# Patient Record
Sex: Male | Born: 1935 | Race: Black or African American | Hispanic: No | Marital: Married | Smoking: Never smoker
Health system: Southern US, Community
[De-identification: ages and names within clinical notes are randomized; demographics above are authoritative.]

## PROBLEM LIST (undated history)

## (undated) DIAGNOSIS — K635 Polyp of colon: Secondary | ICD-10-CM

## (undated) DIAGNOSIS — J302 Other seasonal allergic rhinitis: Secondary | ICD-10-CM

## (undated) DIAGNOSIS — G4730 Sleep apnea, unspecified: Secondary | ICD-10-CM

## (undated) DIAGNOSIS — H269 Unspecified cataract: Secondary | ICD-10-CM

## (undated) DIAGNOSIS — K047 Periapical abscess without sinus: Secondary | ICD-10-CM

## (undated) DIAGNOSIS — I10 Essential (primary) hypertension: Secondary | ICD-10-CM

## (undated) DIAGNOSIS — K219 Gastro-esophageal reflux disease without esophagitis: Secondary | ICD-10-CM

## (undated) DIAGNOSIS — R011 Cardiac murmur, unspecified: Secondary | ICD-10-CM

## (undated) DIAGNOSIS — E785 Hyperlipidemia, unspecified: Secondary | ICD-10-CM

## (undated) DIAGNOSIS — C61 Malignant neoplasm of prostate: Secondary | ICD-10-CM

## (undated) DIAGNOSIS — H35039 Hypertensive retinopathy, unspecified eye: Secondary | ICD-10-CM

## (undated) DIAGNOSIS — H3530 Unspecified macular degeneration: Secondary | ICD-10-CM

## (undated) DIAGNOSIS — M1990 Unspecified osteoarthritis, unspecified site: Secondary | ICD-10-CM

## (undated) HISTORY — PX: TRANSURETHRAL RESECTION OF PROSTATE: SHX73

## (undated) HISTORY — DX: Malignant neoplasm of prostate: C61

## (undated) HISTORY — DX: Other seasonal allergic rhinitis: J30.2

## (undated) HISTORY — DX: Hyperlipidemia, unspecified: E78.5

## (undated) HISTORY — DX: Unspecified macular degeneration: H35.30

## (undated) HISTORY — DX: Hypertensive retinopathy, unspecified eye: H35.039

## (undated) HISTORY — DX: Periapical abscess without sinus: K04.7

## (undated) HISTORY — DX: Essential (primary) hypertension: I10

## (undated) HISTORY — DX: Unspecified cataract: H26.9

## (undated) HISTORY — DX: Gastro-esophageal reflux disease without esophagitis: K21.9

## (undated) HISTORY — DX: Unspecified osteoarthritis, unspecified site: M19.90

## (undated) HISTORY — DX: Polyp of colon: K63.5

---

## 1990-01-23 HISTORY — PX: INGUINAL HERNIA REPAIR: SUR1180

## 2004-08-30 ENCOUNTER — Inpatient Hospital Stay (HOSPITAL_COMMUNITY): Admission: RE | Admit: 2004-08-30 | Discharge: 2004-09-01 | Payer: Self-pay | Admitting: Urology

## 2004-08-30 ENCOUNTER — Encounter (INDEPENDENT_AMBULATORY_CARE_PROVIDER_SITE_OTHER): Payer: Self-pay | Admitting: Specialist

## 2009-01-23 DIAGNOSIS — C61 Malignant neoplasm of prostate: Secondary | ICD-10-CM

## 2009-01-23 HISTORY — DX: Malignant neoplasm of prostate: C61

## 2009-10-04 ENCOUNTER — Ambulatory Visit (HOSPITAL_COMMUNITY): Admission: RE | Admit: 2009-10-04 | Discharge: 2009-10-04 | Payer: Self-pay | Admitting: Urology

## 2009-11-19 ENCOUNTER — Ambulatory Visit
Admission: RE | Admit: 2009-11-19 | Discharge: 2010-01-10 | Payer: Self-pay | Source: Home / Self Care | Attending: Radiation Oncology | Admitting: Radiation Oncology

## 2010-01-06 ENCOUNTER — Ambulatory Visit (HOSPITAL_BASED_OUTPATIENT_CLINIC_OR_DEPARTMENT_OTHER)
Admission: RE | Admit: 2010-01-06 | Discharge: 2010-01-06 | Payer: Self-pay | Source: Home / Self Care | Attending: Urology | Admitting: Urology

## 2010-01-26 ENCOUNTER — Ambulatory Visit
Admission: RE | Admit: 2010-01-26 | Discharge: 2010-02-11 | Payer: Self-pay | Source: Home / Self Care | Attending: Radiation Oncology | Admitting: Radiation Oncology

## 2010-02-23 ENCOUNTER — Ambulatory Visit: Payer: Medicare Other | Admitting: Radiation Oncology

## 2010-04-04 LAB — COMPREHENSIVE METABOLIC PANEL
Alkaline Phosphatase: 55 U/L (ref 39–117)
BUN: 11 mg/dL (ref 6–23)
CO2: 28 mEq/L (ref 19–32)
Chloride: 102 mEq/L (ref 96–112)
GFR calc non Af Amer: >60 mL/min (ref >60)
Glucose, Bld: 130 mg/dL — ABNORMAL HIGH (ref 70–99)
Potassium: 3.8 mEq/L (ref 3.5–5.1)
Total Bilirubin: 1.7 mg/dL — ABNORMAL HIGH (ref 0.3–1.2)

## 2010-04-04 LAB — CBC
HCT: 43.4 % (ref 39.0–52.0)
MCV: 88 fL (ref 78.0–100.0)
RBC: 4.93 MIL/uL (ref 4.22–5.81)
WBC: 6.8 10*3/uL (ref 4.0–10.5)

## 2010-04-04 LAB — PROTIME-INR
INR: 0.93 (ref 0.00–1.49)
Prothrombin Time: 12.7 seconds (ref 11.6–15.2)

## 2010-06-10 NOTE — Discharge Summary (Signed)
NAME:  Robert Mcknight, Robert Mcknight               ACCOUNT NO.:  [id number]   MEDICAL RECORD NO.:  [id number]          PATIENT TYPE:  INP   LOCATION:  1429                         FACILITY:  [facility]   PHYSICIAN:  Excell Seltzer. Annabell Howells, M.D.    DATE OF BIRTH:  08/25/1933   DATE OF ADMISSION:  08/30/2004  DATE OF DISCHARGE:  09/01/2004                                 DISCHARGE SUMMARY   HISTORY OF PRESENT ILLNESS:  Mr. Mcelroy is a 77 year old black male with  BPH, bladder outlet obstruction, urinary retention with failed medical  therapies, to undergo TURP.  He has a history of elevated PSA with TIN on  biopsy.   PAST MEDICAL HISTORY:  1.  Hypertension.  2.  Pulmonary issues.  3.  Hypercholesterolemia.  4.  Reflux.   PAST SURGICAL HISTORY:  1.  Hernia repair.  2.  Colon polypectomies.   ADMISSION MEDICATIONS:  Norvasc, hydrochlorothiazide, Crestor, ranitidine,  vitamin E, Flomax, fish oil, potassium gluconate.   HISTORY AND PHYSICAL:  For additional details of the History and Physical,  please see the attached office H&P.   HOSPITAL COURSE:  On the day of admission, the patient was taken to the  operating room where he underwent a TURP.  A three-way Foley catheter was  left indwelling on CBI postoperatively.  His postoperative course was  unremarkable.  On the first postoperative day, the urine was only light pink  on irrigation.  Irrigation was discontinued.  On 08/10, the second  postoperative day, he was doing well, his Foley was removed, he was able to  void without difficulty.  His T-max was 100.7 but he was afebrile.  He was  discharged with a final diagnosis of BPH with urinary retention.  His  pathology was benign.  There were no complications during his admission.  His discharge medications include Cipro and resumption of his home  medications.  He was instructed to follow up in 2-3 weeks.   DISPOSITION:  To home.   PROGNOSIS:  Good.   CONDITION:  Improved.      Excell Seltzer. Annabell Howells, M.D.  Electronically Signed     JJW/MEDQ  D:  09/22/2004  T:  09/22/2004  Job:  [id number]

## 2010-06-10 NOTE — Op Note (Signed)
NAME:  Robert Mcknight, Robert Mcknight               ACCOUNT NO.:  [id number]   MEDICAL RECORD NO.:  [id number]          PATIENT TYPE:  AMB   LOCATION:  DAY                          FACILITY:  [facility]   PHYSICIAN:  Excell Seltzer. Annabell Howells, M.D.    DATE OF BIRTH:  24-Jun-1933   DATE OF PROCEDURE:  08/30/2004  DATE OF DISCHARGE:                                 OPERATIVE REPORT   PROCEDURE:  Transurethral resection of the prostate.   PREOPERATIVE DIAGNOSIS:  Benign prostatic hypertrophy with urinary  retention.   POSTOPERATIVE DIAGNOSIS:  Benign prostatic hypertrophy with urinary  retention.   SURGEON:  Dr. Bjorn Pippin.   ANESTHESIA:  General.   SPECIMEN:  Prostate chips.   DRAIN:  22-French three-way Foley catheter.   COMPLICATIONS:  None.   INDICATIONS:  [name] is a 77 year old black male with urinary retention.  He has had a prior history of elevated PSA.  He has 3 negative biopsies, the  last in October 2005.  There was some atypia and PIN but no cancer.  He has  failed to improve with Flomax, and we have elected to proceed with TURP   FINDINGS AT PROCEDURE:  The patient was given p.o. Cipro, was taken to the  operating room where a general anesthetic was induced.  His Foley catheter  was removed.  He was placed in lithotomy position.  His perineum and  genitalia were prepped with Betadine solution.  He was draped in the usual  sterile fashion.  The 28-French resectoscope sheath was inserted and fitted  with an Iglesias handle, 12-degree lens, and 26 loop.  Resection of the  prostate was performed on a blended setting.  Initially the middle lobe,  which was quite prominent, was resected followed by the floor of the  prostate to alongside the verumontanum.  The right lobe of prostate was then  resected from bladder neck to apex out to the capsular fibers.  This was  followed by the left lobe.  Some anterior apical tissue was then resected.  The bladder was evacuated free of chips.  Hemostasis was  achieved.  Final  inspection revealed no active bleeding, no retained chips, and intact  ureteral orifices and an external sphincter.  Upon removal of the scope,  pressure on the bladder produced an extensive excellent an stream.  A 22-  [place] three-way Foley catheter was inserted without difficulty.  Balloon  was filled with 30 mL of sterile fluid.  The catheter was held on traction  and irrigated.  The irrigation remained clear. It was then placed to  continuous irrigation and straight drainage.  The patient was taken down  from lithotomy position.  His anesthetic was reversed.  He was moved to the  recovery room in stable condition.  There were no complications.      Excell Seltzer. Annabell Howells, M.D.  Electronically Signed     JJW/MEDQ  D:  08/30/2004  T:  08/30/2004  Job:  [id number]   cc:   Dr. Lucia Bitter, [place]

## 2010-10-24 ENCOUNTER — Encounter: Payer: Self-pay | Admitting: Internal Medicine

## 2010-11-04 ENCOUNTER — Ambulatory Visit (INDEPENDENT_AMBULATORY_CARE_PROVIDER_SITE_OTHER): Payer: Medicare Other | Admitting: Urology

## 2010-11-04 DIAGNOSIS — Z8546 Personal history of malignant neoplasm of prostate: Secondary | ICD-10-CM

## 2010-11-04 DIAGNOSIS — N401 Enlarged prostate with lower urinary tract symptoms: Secondary | ICD-10-CM

## 2010-11-04 DIAGNOSIS — R310 Gross hematuria: Secondary | ICD-10-CM

## 2010-11-04 DIAGNOSIS — N138 Other obstructive and reflux uropathy: Secondary | ICD-10-CM

## 2010-11-07 ENCOUNTER — Ambulatory Visit (AMBULATORY_SURGERY_CENTER): Payer: Medicare Other | Admitting: *Deleted

## 2010-11-07 ENCOUNTER — Telehealth: Payer: Self-pay | Admitting: *Deleted

## 2010-11-07 VITALS — Ht 69.0 in | Wt 209.6 lb

## 2010-11-07 DIAGNOSIS — Z1211 Encounter for screening for malignant neoplasm of colon: Secondary | ICD-10-CM

## 2010-11-07 MED ORDER — PEG-KCL-NACL-NASULF-NA ASC-C 100 G PO SOLR: ORAL | Status: DC

## 2010-11-07 NOTE — Telephone Encounter (Signed)
Pt here for PV for colonoscopy with Dr. Marina Goodell scheduled for 11/21/2010.  Pt has had 2 colonoscopy procedures in [place] with Dr. Aleene Davidson.  Pt thinks last colonoscopy was 6 years ago.  He also said that all of prior colon records from [place] were sent to his PCP; Dr. Jarome Matin.  Release of Information form for Dr. Jarome Matin signed and given to Milford Cage, CMA. Ezra Sites

## 2010-11-07 NOTE — Progress Notes (Signed)
Pt here for PV for colonoscopy with Dr. Perry scheduled for 11/21/2010.  Pt has had 2 colonoscopy procedures in [place] with Dr. Spainhour.  Pt thinks last colonoscopy was 6 years ago.  He also said that all of prior colon records from [place] were sent to his PCP; Dr. Daniel Paterson.  Release of Information form for Dr. Daniel Paterson signed and given to Barbara Smith, CMA. Kathy Smith   

## 2010-11-08 NOTE — Telephone Encounter (Signed)
Record release faxed to Dr. Eloise Harman.

## 2010-11-21 ENCOUNTER — Ambulatory Visit (AMBULATORY_SURGERY_CENTER): Payer: Medicare Other | Admitting: Internal Medicine

## 2010-11-21 ENCOUNTER — Encounter: Payer: Self-pay | Admitting: Internal Medicine

## 2010-11-21 VITALS — BP 158/89 | HR 72 | Temp 97.9°F | Resp 16 | Ht 69.0 in | Wt 209.0 lb

## 2010-11-21 DIAGNOSIS — D126 Benign neoplasm of colon, unspecified: Secondary | ICD-10-CM

## 2010-11-21 DIAGNOSIS — Z86010 Personal history of colonic polyps: Secondary | ICD-10-CM

## 2010-11-21 DIAGNOSIS — Z1211 Encounter for screening for malignant neoplasm of colon: Secondary | ICD-10-CM

## 2010-11-21 MED ORDER — SODIUM CHLORIDE 0.9 % IV SOLN: 500.0000 mL | INTRAVENOUS | Status: DC

## 2010-11-21 NOTE — Progress Notes (Signed)
Pt's abdomen distended.  Up to BR to try to pass air with wife at side

## 2010-11-21 NOTE — Patient Instructions (Signed)
Please review discharge instructions (blue and green sheets)  Await pathology  Resume normal medications  Your blood sugar in the recovery room was 115

## 2010-11-22 ENCOUNTER — Telehealth: Payer: Self-pay | Admitting: *Deleted

## 2010-11-22 NOTE — Telephone Encounter (Signed)

## 2011-01-10 NOTE — Telephone Encounter (Signed)
Not sure if these records came in so I am forwarding this to you.

## 2011-01-11 ENCOUNTER — Encounter: Payer: Self-pay | Admitting: *Deleted

## 2011-01-11 NOTE — Progress Notes (Signed)
On 11/22/09 I-PSS results= [id number] score=4 11/22/09 IIEF results= [id number]

## 2011-01-11 NOTE — Progress Notes (Unsigned)
On I-PSS resuults= 0000001 score=1

## 2011-05-05 ENCOUNTER — Ambulatory Visit (INDEPENDENT_AMBULATORY_CARE_PROVIDER_SITE_OTHER): Payer: Medicare Other | Admitting: Urology

## 2011-05-05 DIAGNOSIS — Z8546 Personal history of malignant neoplasm of prostate: Secondary | ICD-10-CM

## 2011-05-05 DIAGNOSIS — R82998 Other abnormal findings in urine: Secondary | ICD-10-CM

## 2011-05-05 DIAGNOSIS — R310 Gross hematuria: Secondary | ICD-10-CM

## 2011-05-26 ENCOUNTER — Ambulatory Visit (INDEPENDENT_AMBULATORY_CARE_PROVIDER_SITE_OTHER): Payer: Medicare Other | Admitting: Urology

## 2011-05-26 DIAGNOSIS — N401 Enlarged prostate with lower urinary tract symptoms: Secondary | ICD-10-CM

## 2011-05-26 DIAGNOSIS — N138 Other obstructive and reflux uropathy: Secondary | ICD-10-CM

## 2011-05-26 DIAGNOSIS — Z8546 Personal history of malignant neoplasm of prostate: Secondary | ICD-10-CM

## 2011-05-26 DIAGNOSIS — R310 Gross hematuria: Secondary | ICD-10-CM

## 2012-07-05 ENCOUNTER — Ambulatory Visit (INDEPENDENT_AMBULATORY_CARE_PROVIDER_SITE_OTHER): Payer: Medicare Other | Admitting: Urology

## 2012-07-05 DIAGNOSIS — R3129 Other microscopic hematuria: Secondary | ICD-10-CM

## 2012-07-05 DIAGNOSIS — N3040 Irradiation cystitis without hematuria: Secondary | ICD-10-CM

## 2012-07-05 DIAGNOSIS — Z8546 Personal history of malignant neoplasm of prostate: Secondary | ICD-10-CM

## 2013-01-03 ENCOUNTER — Encounter (INDEPENDENT_AMBULATORY_CARE_PROVIDER_SITE_OTHER): Payer: Self-pay

## 2013-01-03 ENCOUNTER — Ambulatory Visit (INDEPENDENT_AMBULATORY_CARE_PROVIDER_SITE_OTHER): Payer: Medicare Other | Admitting: Urology

## 2013-01-03 DIAGNOSIS — N3941 Urge incontinence: Secondary | ICD-10-CM

## 2013-01-03 DIAGNOSIS — N401 Enlarged prostate with lower urinary tract symptoms: Secondary | ICD-10-CM

## 2013-01-03 DIAGNOSIS — R350 Frequency of micturition: Secondary | ICD-10-CM

## 2013-01-03 DIAGNOSIS — N138 Other obstructive and reflux uropathy: Secondary | ICD-10-CM

## 2013-01-03 DIAGNOSIS — Z8546 Personal history of malignant neoplasm of prostate: Secondary | ICD-10-CM

## 2013-01-03 DIAGNOSIS — N529 Male erectile dysfunction, unspecified: Secondary | ICD-10-CM

## 2013-03-14 ENCOUNTER — Ambulatory Visit (INDEPENDENT_AMBULATORY_CARE_PROVIDER_SITE_OTHER): Payer: MEDICARE | Admitting: Urology

## 2013-03-14 DIAGNOSIS — Z8546 Personal history of malignant neoplasm of prostate: Secondary | ICD-10-CM

## 2013-03-14 DIAGNOSIS — N3941 Urge incontinence: Secondary | ICD-10-CM

## 2013-03-14 DIAGNOSIS — R350 Frequency of micturition: Secondary | ICD-10-CM

## 2013-03-20 ENCOUNTER — Inpatient Hospital Stay (HOSPITAL_COMMUNITY)
Admission: EM | Admit: 2013-03-20 | Discharge: 2013-03-24 | DRG: 379 | Disposition: A | Discharge status: Home / Self Care | Payer: MEDICARE | Attending: Internal Medicine | Admitting: Internal Medicine

## 2013-03-20 ENCOUNTER — Encounter (HOSPITAL_COMMUNITY): Payer: Self-pay | Admitting: Emergency Medicine

## 2013-03-20 DIAGNOSIS — R001 Bradycardia, unspecified: Secondary | ICD-10-CM | POA: Diagnosis not present

## 2013-03-20 DIAGNOSIS — K5731 Diverticulosis of large intestine without perforation or abscess with bleeding: Principal | ICD-10-CM | POA: Diagnosis present

## 2013-03-20 DIAGNOSIS — I498 Other specified cardiac arrhythmias: Secondary | ICD-10-CM | POA: Diagnosis present

## 2013-03-20 DIAGNOSIS — K219 Gastro-esophageal reflux disease without esophagitis: Secondary | ICD-10-CM | POA: Diagnosis present

## 2013-03-20 DIAGNOSIS — E119 Type 2 diabetes mellitus without complications: Secondary | ICD-10-CM | POA: Diagnosis present

## 2013-03-20 DIAGNOSIS — K6289 Other specified diseases of anus and rectum: Secondary | ICD-10-CM | POA: Diagnosis present

## 2013-03-20 DIAGNOSIS — Y842 Radiological procedure and radiotherapy as the cause of abnormal reaction of the patient, or of later complication, without mention of misadventure at the time of the procedure: Secondary | ICD-10-CM | POA: Diagnosis present

## 2013-03-20 DIAGNOSIS — E1129 Type 2 diabetes mellitus with other diabetic kidney complication: Secondary | ICD-10-CM | POA: Diagnosis present

## 2013-03-20 DIAGNOSIS — Z800 Family history of malignant neoplasm of digestive organs: Secondary | ICD-10-CM

## 2013-03-20 DIAGNOSIS — N058 Unspecified nephritic syndrome with other morphologic changes: Secondary | ICD-10-CM | POA: Diagnosis present

## 2013-03-20 DIAGNOSIS — Z860100 Personal history of colon polyps, unspecified: Secondary | ICD-10-CM

## 2013-03-20 DIAGNOSIS — G4733 Obstructive sleep apnea (adult) (pediatric): Secondary | ICD-10-CM | POA: Diagnosis present

## 2013-03-20 DIAGNOSIS — Z79899 Other long term (current) drug therapy: Secondary | ICD-10-CM

## 2013-03-20 DIAGNOSIS — Z8546 Personal history of malignant neoplasm of prostate: Secondary | ICD-10-CM

## 2013-03-20 DIAGNOSIS — Z7982 Long term (current) use of aspirin: Secondary | ICD-10-CM

## 2013-03-20 DIAGNOSIS — I10 Essential (primary) hypertension: Secondary | ICD-10-CM | POA: Diagnosis present

## 2013-03-20 DIAGNOSIS — E785 Hyperlipidemia, unspecified: Secondary | ICD-10-CM | POA: Diagnosis present

## 2013-03-20 DIAGNOSIS — K922 Gastrointestinal hemorrhage, unspecified: Secondary | ICD-10-CM

## 2013-03-20 LAB — COMPREHENSIVE METABOLIC PANEL
ALK PHOS: 60 U/L (ref 39–117)
ALT: 13 U/L (ref 0–53)
AST: 19 U/L (ref 0–37)
Albumin: 3.7 g/dL (ref 3.5–5.2)
BUN: 15 mg/dL (ref 6–23)
CALCIUM: 9.6 mg/dL (ref 8.4–10.5)
CO2: 22 meq/L (ref 19–32)
Chloride: 101 mEq/L (ref 96–112)
Creatinine, Ser: 1.16 mg/dL (ref 0.50–1.35)
GFR calc Af Amer: 68 mL/min — ABNORMAL LOW (ref >90)
GFR, EST NON AFRICAN AMERICAN: 58 mL/min — AB (ref >90)
Glucose, Bld: 185 mg/dL — ABNORMAL HIGH (ref 70–99)
POTASSIUM: 3.8 meq/L (ref 3.7–5.3)
SODIUM: 140 meq/L (ref 137–147)
Total Bilirubin: 1 mg/dL (ref 0.3–1.2)
Total Protein: 7.2 g/dL (ref 6.0–8.3)

## 2013-03-20 LAB — CBC WITH DIFFERENTIAL/PLATELET
BASOS ABS: 0 10*3/uL (ref 0.0–0.1)
Basophils Relative: 0 % (ref 0–1)
EOS PCT: 1 % (ref 0–5)
Eosinophils Absolute: 0.1 10*3/uL (ref 0.0–0.7)
HCT: 39.1 % (ref 39.0–52.0)
Hemoglobin: 13.4 g/dL (ref 13.0–17.0)
LYMPHS ABS: 1.3 10*3/uL (ref 0.7–4.0)
LYMPHS PCT: 14 % (ref 12–46)
MCH: 29.5 pg (ref 26.0–34.0)
MCHC: 34.3 g/dL (ref 30.0–36.0)
MCV: 86.1 fL (ref 78.0–100.0)
Monocytes Absolute: 0.5 10*3/uL (ref 0.1–1.0)
Monocytes Relative: 6 % (ref 3–12)
NEUTROS ABS: 7 10*3/uL (ref 1.7–7.7)
NEUTROS PCT: 79 % — AB (ref 43–77)
PLATELETS: 180 10*3/uL (ref 150–400)
RBC: 4.54 MIL/uL (ref 4.22–5.81)
RDW: 15.4 % (ref 11.5–15.5)
WBC: 8.9 10*3/uL (ref 4.0–10.5)

## 2013-03-20 LAB — CBC
HCT: 35.9 % — ABNORMAL LOW (ref 39.0–52.0)
Hemoglobin: 12.1 g/dL — ABNORMAL LOW (ref 13.0–17.0)
MCH: 28.9 pg (ref 26.0–34.0)
MCHC: 33.7 g/dL (ref 30.0–36.0)
MCV: 85.9 fL (ref 78.0–100.0)
PLATELETS: 173 10*3/uL (ref 150–400)
RBC: 4.18 MIL/uL — ABNORMAL LOW (ref 4.22–5.81)
RDW: 15.4 % (ref 11.5–15.5)
WBC: 7.9 10*3/uL (ref 4.0–10.5)

## 2013-03-20 LAB — URINALYSIS, ROUTINE W REFLEX MICROSCOPIC
BILIRUBIN URINE: NEGATIVE
Glucose, UA: 250 mg/dL — AB
HGB URINE DIPSTICK: NEGATIVE
KETONES UR: NEGATIVE mg/dL
Leukocytes, UA: NEGATIVE
Nitrite: NEGATIVE
PH: 6 (ref 5.0–8.0)
Protein, ur: NEGATIVE mg/dL
SPECIFIC GRAVITY, URINE: 1.008 (ref 1.005–1.030)
Urobilinogen, UA: 0.2 mg/dL (ref 0.0–1.0)

## 2013-03-20 LAB — GLUCOSE, CAPILLARY: Glucose-Capillary: 106 mg/dL — ABNORMAL HIGH (ref 70–99)

## 2013-03-20 LAB — ABO/RH: ABO/RH(D): A POS

## 2013-03-20 LAB — TYPE AND SCREEN
ABO/RH(D): A POS
Antibody Screen: NEGATIVE

## 2013-03-20 MED ORDER — ACETAMINOPHEN 650 MG RE SUPP: 650.0000 mg | Freq: Four times a day (QID) | RECTAL | Status: DC | PRN

## 2013-03-20 MED ORDER — ATORVASTATIN CALCIUM 40 MG PO TABS
40.0000 mg | ORAL_TABLET | Freq: Every day | ORAL | Status: DC
  Administered 2013-03-21: 40 mg via ORAL
  Filled 2013-03-20 (×5): qty 1

## 2013-03-20 MED ORDER — IRBESARTAN 150 MG PO TABS
150.0000 mg | ORAL_TABLET | Freq: Every day | ORAL | Status: DC
  Administered 2013-03-21 – 2013-03-23 (×3): 150 mg via ORAL
  Filled 2013-03-20 (×3): qty 1

## 2013-03-20 MED ORDER — INSULIN ASPART 100 UNIT/ML ~~LOC~~ SOLN
0.0000 [IU] | Freq: Three times a day (TID) | SUBCUTANEOUS | Status: DC
  Administered 2013-03-21: 2 [IU] via SUBCUTANEOUS

## 2013-03-20 MED ORDER — OLOPATADINE HCL 0.1 % OP SOLN
1.0000 [drp] | Freq: Two times a day (BID) | OPHTHALMIC | Status: DC
  Administered 2013-03-20 – 2013-03-22 (×4): 1 [drp] via OPHTHALMIC
  Filled 2013-03-20: qty 5

## 2013-03-20 MED ORDER — SODIUM CHLORIDE 0.9 % IJ SOLN
3.0000 mL | Freq: Two times a day (BID) | INTRAMUSCULAR | Status: DC
  Administered 2013-03-22 – 2013-03-23 (×2): 3 mL via INTRAVENOUS

## 2013-03-20 MED ORDER — ACETAMINOPHEN 325 MG PO TABS: 650.0000 mg | ORAL_TABLET | Freq: Four times a day (QID) | ORAL | Status: DC | PRN

## 2013-03-20 MED ORDER — PANTOPRAZOLE SODIUM 40 MG PO TBEC
40.0000 mg | DELAYED_RELEASE_TABLET | Freq: Every day | ORAL | Status: DC
  Administered 2013-03-20 – 2013-03-24 (×5): 40 mg via ORAL
  Filled 2013-03-20 (×5): qty 1

## 2013-03-20 MED ORDER — SODIUM CHLORIDE 0.9 % IV SOLN
INTRAVENOUS | Status: DC
  Administered 2013-03-20 – 2013-03-21 (×2): via INTRAVENOUS

## 2013-03-20 NOTE — Progress Notes (Signed)
Spoke with patient in regards to nighttime CPAP ordered. Patient states compliance at home but declines use of hospital provided machine at this time, states would like to ' skip wearing' CPAP for tonight, verbalizes understanding that at any point if this decision changes RT available for assistance.

## 2013-03-20 NOTE — ED Provider Notes (Signed)
CSN: [id number]     Arrival date & time 03/20/13  1219 History   First MD Initiated Contact with Patient 03/20/13 1245     Chief Complaint  Patient presents with  . Rectal Bleeding    HPI Patient presents emergency Department with 4 episodes of bright red blood today.  His last episode was approximately 10:30 in the morning however he feels like he needs to have a bowel movement now.  He states he does feel a fullness sensation in his lower abdomen.  He denies abdominal discomfort and pain otherwise.  No fevers or chills.  Colonoscopy in 2012.  Her review the records and it sounds like there is a significant history of colonic diverticulosis noted on colonoscopy at that time.  Colonoscopy was performed by Dr. Henrene Pastor.  Patient is on an 81 mg aspirin daily and denies other use of anticoagulants.  No lightheadedness.  No other complaints.  No prior history of GI bleeding.   Past Medical History  Diagnosis Date  . Diabetes mellitus   . Hypertension   . Prostate cancer 2011  . Seasonal allergies   . GERD (gastroesophageal reflux disease)    Past Surgical History  Procedure Laterality Date  . Inguinal hernia repair  1992    left  . Transurethral resection of prostate     Family History  Problem Relation Age of Onset  . Colon cancer Brother 75  . Stomach cancer Neg Hx   . Colon cancer Brother    History  Substance Use Topics  . Smoking status: Never Smoker   . Smokeless tobacco: Not on file  . Alcohol Use: No    Review of Systems  All other systems reviewed and are negative.      Allergies  Review of patient's allergies indicates no known allergies.  Home Medications   Current Outpatient Rx  Name  Route  Sig  Dispense  Refill  . aspirin EC 81 MG tablet   Oral   Take 81 mg by mouth daily.         . Bepotastine Besilate (BEPREVE) 1.5 % SOLN   Both Eyes   Place 1 drop into both eyes 2 (two) times daily as needed. Vision not clear         . Calcium  Carb-Cholecalciferol (CALCIUM CARBONATE-VITAMIN D3 PO)   Oral   Take 1 tablet by mouth 2 (two) times daily. Calcium 600 mg and Vitamin  D 800 units         . metFORMIN (GLUMETZA) 500 MG (MOD) 24 hr tablet   Oral   Take 500 mg by mouth 2 (two) times daily with a meal.         . metoprolol tartrate (LOPRESSOR) 25 MG tablet   Oral   Take 25 mg by mouth 2 (two) times daily.         . naproxen sodium (ANAPROX) 220 MG tablet   Oral   Take 220 mg by mouth 2 (two) times daily as needed (pain).         [name] Kitchen olmesartan-hydrochlorothiazide (BENICAR HCT) 40-12.5 MG per tablet   Oral   Take 1 tablet by mouth daily.           . Omega-3 Fatty Acids (FISH OIL) 1000 MG CPDR   Oral   Take 2 capsules by mouth 2 (two) times daily.          [name] Kitchen omeprazole (PRILOSEC) 20 MG capsule   Oral   Take 20 mg  by mouth daily.           [name] Kitchen oxybutynin (DITROPAN) 5 MG tablet   Oral   Take 5 mg by mouth daily.         . Potassium 99 MG TABS   Oral   Take 99 mg by mouth 2 (two) times daily.          . propranolol (INDERAL) 10 MG tablet   Oral   Take 10 mg by mouth daily.           . simvastatin (ZOCOR) 80 MG tablet   Oral   Take 80 mg by mouth at bedtime.           . vitamin E (VITAMIN E) 1000 UNIT capsule   Oral   Take 1,000 Units by mouth daily.          BP 160/71  Pulse 52  Temp(Src) 98.2 F (36.8 C)  Resp 16  SpO2 99% Physical Exam  Nursing note and vitals reviewed. Constitutional: He is oriented to person, place, and time. He appears well-developed and well-nourished.  HENT:  Head: Normocephalic and atraumatic.  Eyes: EOM are normal.  Neck: Normal range of motion.  Cardiovascular: Normal rate, regular rhythm, normal heart sounds and intact distal pulses.   Pulmonary/Chest: Effort normal and breath sounds normal. No respiratory distress.  Abdominal: Soft. He exhibits no distension. There is no tenderness.  Genitourinary:  Blood stained anus with gross blood on  rectal exam.  No external hemorrhoids noted  Musculoskeletal: Normal range of motion.  Neurological: He is alert and oriented to person, place, and time.  Skin: Skin is warm and dry.  Psychiatric: He has a normal mood and affect. Judgment normal.    ED Course  Procedures (including critical care time) Labs Review Labs Reviewed  CBC WITH DIFFERENTIAL - Abnormal; Notable for the following:    Neutrophils Relative % 79 (*)    All other components within normal limits  COMPREHENSIVE METABOLIC PANEL - Abnormal; Notable for the following:    Glucose, Bld 185 (*)    GFR calc non Af Amer 58 (*)    GFR calc Af Amer 68 (*)    All other components within normal limits  URINALYSIS, ROUTINE W REFLEX MICROSCOPIC - Abnormal; Notable for the following:    Glucose, UA 250 (*)    All other components within normal limits   Imaging Review No results found.  EKG Interpretation   None       MDM   Final diagnoses:  Lower GI bleed    Patient be admitted for serial CBCs.  I spoke with Dr. Dagmar Hait, who will admit the pt. hemodynamically stable.  Hemoglobin baseline.    Hoy Morn, MD 03/20/13 [phone]

## 2013-03-20 NOTE — H&P (Signed)
PCP:   Donnajean Lopes, MD   Chief Complaint:  Rectal bleeding  HPI: 80 year old gentleman with past medical history significant for type 2 diabetes, with some microvascular complications, prostate cancer, colon polyps and diverticulosis based on colonoscopy from 2012, hypertension, hyperlipidemia, obstructive sleep apnea who presents with 3 episodes of bloody stools that occurred this morning, with no history of similar symptoms, patient denies any significant abdominal pain possibly some mild lower abdominal or low back discomfort which is vaguely defined, denies fevers, chills, nausea, vomiting. Last episode of bloody stools approximately the 10-11 AM this morning, currently at 5 PM, no further symptomatology, patient presented to the emergency room, hemodynamically stable, hemoglobin stable and reasonable, however rectal exam did reveal significant bloody stool in the vault per emergency room physician, patient is now being in admitted for further evaluation and management of a possible diverticular bleed. Patient is hemodynamically stable.  Review of Systems:  Patient denies fevers, chills, significant weight changes, swallowing difficulties, sore throat, significant nasal congestion, chest pain, shortness of breath, palpitations, presyncope, but does admit to dry mouth which he attributes to his bladder medication recently increased by Dr. Jeffie Pollock, patient states that he typically has 2 bowel movements in the morning, without blood, denies any fevers, chills, and denies reflux symptomatology. Does have some issues with lower extremity neuropathy however no significant sensory deficits, denies lower extremity edema, or focal neurologic deficits otherwise. Denies recent increase in NSAIDs over-the-counter and denies any history of similar bleeding complications.  Past Medical History: Past Medical History  Diagnosis Date  . Diabetes mellitus   . Hypertension   . Prostate cancer 2011  . Seasonal  allergies   . GERD (gastroesophageal reflux disease)    diabetic nephropathy as well as possible neuropathy Obstructive sleep apnea Colon polyps Diverticulosis based on colonoscopy 2012 History of anemia ERectal dysfunction Hyperlipidemia Prostate cancer with history of radioactive seed implantation, transurethral resection of the prostate, followed by Dr. Jeffie Pollock history of gross hematuria 5/13 with negative cystoscopy Past Surgical History  Procedure Laterality Date  . Inguinal hernia repair  1992    left  . Transurethral resection of prostate      Medications: Prior to Admission medications   Medication Sig Start Date End Date Taking? Authorizing Provider  aspirin EC 81 MG tablet Take 81 mg by mouth daily.   Yes Historical Provider, MD  Bepotastine Besilate (BEPREVE) 1.5 % SOLN Place 1 drop into both eyes 2 (two) times daily as needed. Vision not clear   Yes Historical Provider, MD  Calcium Carb-Cholecalciferol (CALCIUM CARBONATE-VITAMIN D3 PO) Take 1 tablet by mouth 2 (two) times daily. Calcium 600 mg and Vitamin  D 800 units   Yes Historical Provider, MD  metFORMIN (GLUMETZA) 500 MG (MOD) 24 hr tablet Take 500 mg by mouth 2 (two) times daily with a meal.   Yes Historical Provider, MD  metoprolol tartrate (LOPRESSOR) 25 MG tablet Take 25 mg by mouth 2 (two) times daily.   Yes Historical Provider, MD  naproxen sodium (ANAPROX) 220 MG tablet Take 220 mg by mouth 2 (two) times daily as needed (pain).   Yes Historical Provider, MD  olmesartan-hydrochlorothiazide (BENICAR HCT) 40-12.5 MG per tablet Take 1 tablet by mouth daily.     Yes Historical Provider, MD  Omega-3 Fatty Acids (FISH OIL) 1000 MG CPDR Take 2 capsules by mouth 2 (two) times daily.    Yes Historical Provider, MD  omeprazole (PRILOSEC) 20 MG capsule Take 20 mg by mouth daily.  Yes Historical Provider, MD  oxybutynin (DITROPAN) 5 MG tablet Take 5 mg by mouth daily.   Yes Historical Provider, MD  Potassium 99 MG TABS Take  99 mg by mouth 2 (two) times daily.    Yes Historical Provider, MD  propranolol (INDERAL) 10 MG tablet Take 10 mg by mouth daily.     Yes Historical Provider, MD  simvastatin (ZOCOR) 80 MG tablet Take 80 mg by mouth at bedtime.     Yes Historical Provider, MD  vitamin E (VITAMIN E) 1000 UNIT capsule Take 1,000 Units by mouth daily.   Yes Historical Provider, MD    Allergies:  No Known Allergies  Social History:  reports that he has never smoked. He has never used smokeless tobacco. He reports that he does not drink alcohol or use illicit drugs. Married 2 children retired works part-time on long, nonsmoker, nondrinker  Family History: Family History  Problem Relation Age of Onset  . Colon cancer Brother 29  . Stomach cancer Neg Hx   . Colon cancer Brother     Physical Exam: Filed Vitals:   03/20/13 1315 03/20/13 1330 03/20/13 1400 03/20/13 1556  BP:  180/71 160/71 180/83  Pulse: 65 56 52 81  Temp:      Resp:    18  SpO2: 99% 98% 99% 99%   Physical exam General appearance no apparent distress pleasant answering all questions appropriately lying flat no respiratory distress Sclera anicteric extraconal movements are intact face symmetric and atraumatic No oropharyngeal lesions Neck supple no, no cervical lymphadenopathy Lungs clear to auscultation bilaterally with no accessory muscle usage Cardiovascular reveals regular rate and rhythm with no murmur rubs or gallops appreciated No axillary lymphadenopathy Abdomen soft, nontender, nondistended, bowel sounds present, no hepatosplenomegaly appreciated No clubbing cyanosis or edema with pedal pulses intact possibly slightly diminished bilaterally Cranial nerves II through XII grossly intact No resting tremors tone normal in all 4 extremities, strength unremarkable and range of motion intact against gravity in all 4 extremities Sensory exam to light touch intact all 4 extremities   Labs on Admission:   Recent Labs   03/20/13 1245  NA 140  K 3.8  CL 101  CO2 22  GLUCOSE 185*  BUN 15  CREATININE 1.16  CALCIUM 9.6    Recent Labs  03/20/13 1245  AST 19  ALT 13  ALKPHOS 60  BILITOT 1.0  PROT 7.2  ALBUMIN 3.7   No results found for this basename: LIPASE, AMYLASE,  in the last 72 hours  Recent Labs  03/20/13 1245  WBC 8.9  NEUTROABS 7.0  HGB 13.4  HCT 39.1  MCV 86.1  PLT 180   No results found for this basename: CKTOTAL, CKMB, CKMBINDEX, TROPONINI,  in the last 72 hours No results found for this basename: TSH, T4TOTAL, FREET3, T3FREE, THYROIDAB,  in the last 72 hours No results found for this basename: VITAMINB12, FOLATE, FERRITIN, TIBC, IRON, RETICCTPCT,  in the last 72 hours  Radiological Exams on Admission: No results found. No orders found for this or any previous visit.  Assessment/Plan Rectal bleeding-unclear etiology but presumably secondary to diverticular bleed, currently hemodynamically stable, hemoglobin normal, but at risk for recurrent bleeding, will check with serial CBCs, exam currently unremarkable, we'll watch for instability and treat conservatively with clear fluids, serial exams, and it decompensates will obtain CT scan, and/or GI consult. Patient has been typed and screened. Type 2 diabetes-we'll keep on clear fluids, will discontinue metformin and place on sliding scale insulin  Hypertension-hemodynamically stable, will allow blood pressure to increase however will restart ARB therapy and hold if systolic blood pressure greater than 1:30  Diverticulosis with history of colon polyps with colonoscopy by  Dr. Henrene Pastor 2012, making likelihood of diverticular bleeding high, we'll watch conservatively for now. Obstructive sleep apnea-patient uses CPAP machine at home, will allow to use similar device from home GERD-continue proton pump Hyperlipidemia-continue home medications Prostate cancer, apparently stable followed by urology on regular basis last seen last week,  patient perceived side effects with dry mouth on oxybutynin but hold for right now, patient denies any issues with constipation  Treasa Bradshaw R 03/20/2013, 5:11 PM

## 2013-03-20 NOTE — ED Notes (Signed)
Pt encouraged to void when able. Pt reports voided just prior to arrival. Pt reports will attempt when able.

## 2013-03-20 NOTE — ED Notes (Signed)
Pt had bowel movement this morning with "a lot" of bright red blood; has gone 3 times total this morning; lower back and lower abd feels "funny and weak"; denies dizziness

## 2013-03-21 LAB — CBC
HEMATOCRIT: 36.2 % — AB (ref 39.0–52.0)
Hemoglobin: 12.1 g/dL — ABNORMAL LOW (ref 13.0–17.0)
MCH: 28.9 pg (ref 26.0–34.0)
MCHC: 33.4 g/dL (ref 30.0–36.0)
MCV: 86.4 fL (ref 78.0–100.0)
Platelets: 167 10*3/uL (ref 150–400)
RBC: 4.19 MIL/uL — ABNORMAL LOW (ref 4.22–5.81)
RDW: 15.6 % — AB (ref 11.5–15.5)
WBC: 7.7 10*3/uL (ref 4.0–10.5)

## 2013-03-21 LAB — GLUCOSE, CAPILLARY
GLUCOSE-CAPILLARY: 111 mg/dL — AB (ref 70–99)
Glucose-Capillary: 163 mg/dL — ABNORMAL HIGH (ref 70–99)
Glucose-Capillary: 119 mg/dL — ABNORMAL HIGH (ref 70–99)
Glucose-Capillary: 117 mg/dL — ABNORMAL HIGH (ref 70–99)

## 2013-03-21 MED ORDER — POTASSIUM CHLORIDE IN NACL 20-0.9 MEQ/L-% IV SOLN
INTRAVENOUS | Status: DC
  Administered 2013-03-21 – 2013-03-22 (×3): via INTRAVENOUS
  Filled 2013-03-21 (×4): qty 1000

## 2013-03-21 NOTE — Progress Notes (Signed)
UR completed. Patient changed to inpatient- requiring IVF @ 75cc/hr 

## 2013-03-21 NOTE — Progress Notes (Signed)
Subjective: Has mild lower abdominal crampy pain, no recurrent rectal bleeding or bowel movements  Objective: Vital signs in last 24 hours: Temp:  [98.2 F (36.8 C)-98.4 F (36.9 C)] 98.4 F (36.9 C) (02/27 6270) Pulse Rate:  [52-81] 59 (02/27 0614) Resp:  [16-20] 19 (02/27 0614) BP: (149-191)/(71-112) 166/77 mmHg (02/27 0614) SpO2:  [95 %-100 %] 98 % (02/27 0614) Weight:  [88.2 kg (194 lb 7.1 oz)] 88.2 kg (194 lb 7.1 oz) (02/26 1839) Weight change:    Intake/Output from previous day: 02/26 0701 - 02/27 0700 In: -  Out: 450 [Urine:450]   General appearance: alert, cooperative and no distress Resp: clear to auscultation bilaterally Cardio: regular rate and rhythm GI: soft, non-tender; bowel sounds normal; no masses,  no organomegaly  Lab Results:  Recent Labs  03/20/13 1926 03/21/13 0410  WBC 7.9 7.7  HGB 12.1* 12.1*  HCT 35.9* 36.2*  PLT 173 167   BMET  Recent Labs  03/20/13 1245  NA 140  K 3.8  CL 101  CO2 22  GLUCOSE 185*  BUN 15  CREATININE 1.16  CALCIUM 9.6   CMET CMP     Component Value Date/Time   NA 140 03/20/2013 1245   K 3.8 03/20/2013 1245   CL 101 03/20/2013 1245   CO2 22 03/20/2013 1245   GLUCOSE 185* 03/20/2013 1245   BUN 15 03/20/2013 1245   CREATININE 1.16 03/20/2013 1245   CALCIUM 9.6 03/20/2013 1245   PROT 7.2 03/20/2013 1245   ALBUMIN 3.7 03/20/2013 1245   AST 19 03/20/2013 1245   ALT 13 03/20/2013 1245   ALKPHOS 60 03/20/2013 1245   BILITOT 1.0 03/20/2013 1245   GFRNONAA 58* 03/20/2013 1245   GFRAA 68* 03/20/2013 1245    CBG (last 3)   Recent Labs  03/20/13 2230  GLUCAP 106*    INR RESULTS:   Lab Results  Component Value Date   INR 0.93 12/30/2009     Studies/Results: No results found.  Medications: I have reviewed the patient's current medications.  Assessment/Plan: #1 Rectal bleed: most likely from diverticular source, less likely from proctitis or neoplasm. Will monitor today and recheck labs in AM, and if condition  stays stable will discharge tomorrow #2 DM2: stable on SSI #3 HTN:  BP mildly elevated today, and will monitor and consider increased meds if stays consistently elevated.   LOS: 1 day   Marshal Eskew G 03/21/2013, 7:27 AM

## 2013-03-22 DIAGNOSIS — E119 Type 2 diabetes mellitus without complications: Secondary | ICD-10-CM | POA: Diagnosis present

## 2013-03-22 DIAGNOSIS — I10 Essential (primary) hypertension: Secondary | ICD-10-CM | POA: Diagnosis present

## 2013-03-22 DIAGNOSIS — E785 Hyperlipidemia, unspecified: Secondary | ICD-10-CM | POA: Diagnosis present

## 2013-03-22 LAB — COMPREHENSIVE METABOLIC PANEL
ALK PHOS: 48 U/L (ref 39–117)
ALT: 9 U/L (ref 0–53)
AST: 12 U/L (ref 0–37)
Albumin: 2.9 g/dL — ABNORMAL LOW (ref 3.5–5.2)
BUN: 18 mg/dL (ref 6–23)
CALCIUM: 8.5 mg/dL (ref 8.4–10.5)
CO2: 23 mEq/L (ref 19–32)
CREATININE: 1.11 mg/dL (ref 0.50–1.35)
Chloride: 104 mEq/L (ref 96–112)
GFR calc Af Amer: 71 mL/min — ABNORMAL LOW (ref >90)
GFR, EST NON AFRICAN AMERICAN: 62 mL/min — AB (ref >90)
Glucose, Bld: 115 mg/dL — ABNORMAL HIGH (ref 70–99)
Potassium: 3.6 mEq/L — ABNORMAL LOW (ref 3.7–5.3)
Sodium: 138 mEq/L (ref 137–147)
Total Bilirubin: 1.1 mg/dL (ref 0.3–1.2)
Total Protein: 5.5 g/dL — ABNORMAL LOW (ref 6.0–8.3)

## 2013-03-22 LAB — CBC
HCT: 34.8 % — ABNORMAL LOW (ref 39.0–52.0)
Hemoglobin: 11.6 g/dL — ABNORMAL LOW (ref 13.0–17.0)
MCH: 28.9 pg (ref 26.0–34.0)
MCHC: 33.3 g/dL (ref 30.0–36.0)
MCV: 86.6 fL (ref 78.0–100.0)
Platelets: 159 10*3/uL (ref 150–400)
RBC: 4.02 MIL/uL — ABNORMAL LOW (ref 4.22–5.81)
RDW: 15.9 % — AB (ref 11.5–15.5)
WBC: 8.2 10*3/uL (ref 4.0–10.5)

## 2013-03-22 LAB — GLUCOSE, CAPILLARY
GLUCOSE-CAPILLARY: 147 mg/dL — AB (ref 70–99)
GLUCOSE-CAPILLARY: 114 mg/dL — AB (ref 70–99)
Glucose-Capillary: 115 mg/dL — ABNORMAL HIGH (ref 70–99)
Glucose-Capillary: 153 mg/dL — ABNORMAL HIGH (ref 70–99)

## 2013-03-22 MED ORDER — METOPROLOL TARTRATE 12.5 MG HALF TABLET
12.5000 mg | ORAL_TABLET | Freq: Two times a day (BID) | ORAL | Status: DC
  Administered 2013-03-22: 12.5 mg via ORAL
  Filled 2013-03-22 (×3): qty 1

## 2013-03-22 NOTE — Consult Note (Signed)
Consult for [facility]  Reason for Consult: Hematochezia Referring Physician: Mark Perini, M.D.  Robert Mcknight HPI: This is a 80 year old male with a PMH of tubular adenomas (2012), diverticula, and prostate cancer s/p brachytherapy in 2013 who is admitted for complaints of hematochezia.  It started acutely on the day of admission and he subsequently presented to the ER for further evaluation and treatment.  A rectal examination at that time revealed bloody stools.  Over the course of his hospitalization his HGB has not significantly dropped, but today he started to have some more hematochezia.  There was the thought that he may be bleeding from a radiation proctitis.  He had seed implantation in 2013 and his colonoscopy was in 2012.  Past Medical History  Diagnosis Date  . Diabetes mellitus   . Hypertension   . Prostate cancer 2011  . Seasonal allergies   . GERD (gastroesophageal reflux disease)     Past Surgical History  Procedure Laterality Date  . Inguinal hernia repair  1992    left  . Transurethral resection of prostate      Family History  Problem Relation Age of Onset  . Colon cancer Brother 70  . Stomach cancer Neg Hx   . Colon cancer Brother     Social History:  reports that he has never smoked. He has never used smokeless tobacco. He reports that he does not drink alcohol or use illicit drugs.  Allergies: No Known Allergies  Medications:  Scheduled: . atorvastatin  40 mg Oral q1800  . insulin aspart  0-9 Units Subcutaneous TID WC  . irbesartan  150 mg Oral Daily  . olopatadine  1 drop Both Eyes BID  . pantoprazole  40 mg Oral Daily  . sodium chloride  3 mL Intravenous Q12H   Continuous: . 0.9 % NaCl with KCl 20 mEq / L 40 mL/hr at 03/22/13 1500    Results for orders placed during the hospital encounter of 03/20/13 (from the past 24 hour(s))  GLUCOSE, CAPILLARY     Status: Abnormal   Collection Time    03/21/13  5:15 PM      Result Value Ref Range    Glucose-Capillary 119 (*) 70 - 99 mg/dL   Comment 1 Documented in Chart     Comment 2 Notify RN    GLUCOSE, CAPILLARY     Status: Abnormal   Collection Time    03/21/13  9:23 PM      Result Value Ref Range   Glucose-Capillary 117 (*) 70 - 99 mg/dL  CBC     Status: Abnormal   Collection Time    03/22/13  5:15 AM      Result Value Ref Range   WBC 8.2  4.0 - 10.5 K/uL   RBC 4.02 (*) 4.22 - 5.81 MIL/uL   Hemoglobin 11.6 (*) 13.0 - 17.0 g/dL   HCT 34.8 (*) 39.0 - 52.0 %   MCV 86.6  78.0 - 100.0 fL   MCH 28.9  26.0 - 34.0 pg   MCHC 33.3  30.0 - 36.0 g/dL   RDW 15.9 (*) 11.5 - 15.5 %   Platelets 159  150 - 400 K/uL  COMPREHENSIVE METABOLIC PANEL     Status: Abnormal   Collection Time    03/22/13  5:15 AM      Result Value Ref Range   Sodium 138  137 - 147 mEq/L   Potassium 3.6 (*) 3.7 - 5.3 mEq/L   Chloride   104  96 - 112 mEq/L   CO2 23  19 - 32 mEq/L   Glucose, Bld 115 (*) 70 - 99 mg/dL   BUN 18  6 - 23 mg/dL   Creatinine, Ser 1.11  0.50 - 1.35 mg/dL   Calcium 8.5  8.4 - 10.5 mg/dL   Total Protein 5.5 (*) 6.0 - 8.3 g/dL   Albumin 2.9 (*) 3.5 - 5.2 g/dL   AST 12  0 - 37 U/L   ALT 9  0 - 53 U/L   Alkaline Phosphatase 48  39 - 117 U/L   Total Bilirubin 1.1  0.3 - 1.2 mg/dL   GFR calc non Af Amer 62 (*) >90 mL/min   GFR calc Af Amer 71 (*) >90 mL/min  GLUCOSE, CAPILLARY     Status: Abnormal   Collection Time    03/22/13  8:37 AM      Result Value Ref Range   Glucose-Capillary 115 (*) 70 - 99 mg/dL  GLUCOSE, CAPILLARY     Status: Abnormal   Collection Time    03/22/13 12:21 PM      Result Value Ref Range   Glucose-Capillary 147 (*) 70 - 99 mg/dL     No results found.  ROS:  As stated above in the HPI otherwise negative.  Blood pressure 184/80, pulse 70, temperature 97.7 F (36.5 C), temperature source Oral, resp. rate 18, height 5\' 9"  (1.753 m), weight 194 lb 7.1 oz (88.2 kg), SpO2 100.00%.    PE: Gen: NAD, Alert and Oriented HEENT:  [place]/AT, EOMI Neck: Supple, no  LAD Lungs: CTA Bilaterally CV: RRR without M/G/R ABM: Soft, NTND, +BS Ext: No C/C/E  Assessment/Plan: 1) Hematochezia. 2) History of diverticula. 3) S/p brachytherapy for prostate cancer.   With his continued hematochezia and his prior history of brachytherapy, it will be worthwhile to perform a FFS.  If he has a radiation proctitis, this issue can be treated with APC.  Plan: 1) FFS with possible APC tomorrow AM.  Carol Ada D 03/22/2013, 4:46 PM

## 2013-03-22 NOTE — Progress Notes (Signed)
Subjective: No pain.  Still some rectal bleeding this a.m.,, feels a bit of soreness or swelling in rectal area.  Objective: Vital signs in last 24 hours: Temp:  [97.4 F (36.3 C)-98.5 F (36.9 C)] 97.7 F (36.5 C) (02/28 1303) Pulse Rate:  [48-70] 70 (02/28 1303) Resp:  [18] 18 (02/28 0445) BP: (136-184)/(62-80) 184/80 mmHg (02/28 1303) SpO2:  [97 %-100 %] 100 % (02/28 1303) Weight change:  Last BM Date: 03/21/13  Intake/Output from previous day: 02/27 0701 - 02/28 0700 In: 1530 [P.O.:720; I.V.:810] Out: -  Intake/Output this shift:    General appearance: alert, cooperative and appears stated age Resp: clear to auscultation bilaterally Cardio: regular rate and rhythm, S1, S2 normal, no murmur, click, rub or gallop GI: soft, non-tender; bowel sounds normal; no masses,  no organomegaly Extremities: extremities normal, atraumatic, no cyanosis or edema Neurologic: Grossly normal   Lab Results:  Recent Labs  03/21/13 0410 03/22/13 0515  WBC 7.7 8.2  HGB 12.1* 11.6*  HCT 36.2* 34.8*  PLT 167 159   BMET  Recent Labs  03/20/13 1245 03/22/13 0515  NA 140 138  K 3.8 3.6*  CL 101 104  CO2 22 23  GLUCOSE 185* 115*  BUN 15 18  CREATININE 1.16 1.11  CALCIUM 9.6 8.5   CMET CMP     Component Value Date/Time   NA 138 03/22/2013 0515   K 3.6* 03/22/2013 0515   CL 104 03/22/2013 0515   CO2 23 03/22/2013 0515   GLUCOSE 115* 03/22/2013 0515   BUN 18 03/22/2013 0515   CREATININE 1.11 03/22/2013 0515   CALCIUM 8.5 03/22/2013 0515   PROT 5.5* 03/22/2013 0515   ALBUMIN 2.9* 03/22/2013 0515   AST 12 03/22/2013 0515   ALT 9 03/22/2013 0515   ALKPHOS 48 03/22/2013 0515   BILITOT 1.1 03/22/2013 0515   GFRNONAA 62* 03/22/2013 0515   GFRAA 71* 03/22/2013 0515     Studies/Results: No results found.  Medications: I have reviewed the patient's current medications.  [name] Kitchen atorvastatin  40 mg Oral q1800  . insulin aspart  0-9 Units Subcutaneous TID WC  . irbesartan  150 mg Oral Daily   . olopatadine  1 drop Both Eyes BID  . pantoprazole  40 mg Oral Daily  . sodium chloride  3 mL Intravenous Q12H    CBG (last 3)   Recent Labs  03/21/13 2123 03/22/13 0837 03/22/13 1221  GLUCAP 117* 115* 147*      Assessment/Plan:  Active Problems:   Diverticular hemorrhage-i am not sure this is diverticular . i suspect it may be radiation proctitis from his xrt seeds for prostate cancer. I will consult GI as he may need eval to decide if this is the issue. DM2 -stable. Htn -stable.     LOS: 2 days   Robert Ly, MD 03/22/2013, 1:56 PM

## 2013-03-22 NOTE — Progress Notes (Signed)
Patient refused to wear CPAP tonight.  Family stated that they would bring home mask tomorrow night.  Was told if he changed his mind or had any complications throughout the night to call RT.

## 2013-03-23 ENCOUNTER — Encounter (HOSPITAL_COMMUNITY): Payer: Self-pay | Admitting: *Deleted

## 2013-03-23 ENCOUNTER — Encounter (HOSPITAL_COMMUNITY)
Admission: EM | Disposition: A | Discharge status: Home / Self Care | Payer: Self-pay | Source: Home / Self Care | Attending: Internal Medicine

## 2013-03-23 HISTORY — PX: FLEXIBLE SIGMOIDOSCOPY: SHX5431

## 2013-03-23 LAB — GLUCOSE, CAPILLARY
Glucose-Capillary: 107 mg/dL — ABNORMAL HIGH (ref 70–99)
Glucose-Capillary: 100 mg/dL — ABNORMAL HIGH (ref 70–99)
Glucose-Capillary: 105 mg/dL — ABNORMAL HIGH (ref 70–99)
Glucose-Capillary: 142 mg/dL — ABNORMAL HIGH (ref 70–99)

## 2013-03-23 SURGERY — SIGMOIDOSCOPY, FLEXIBLE
Anesthesia: Moderate Sedation

## 2013-03-23 MED ORDER — HYDROCHLOROTHIAZIDE 12.5 MG PO CAPS
12.5000 mg | ORAL_CAPSULE | Freq: Every day | ORAL | Status: DC
  Administered 2013-03-23 – 2013-03-24 (×2): 12.5 mg via ORAL
  Filled 2013-03-23 (×2): qty 1

## 2013-03-23 MED ORDER — MIDAZOLAM HCL 10 MG/2ML IJ SOLN
INTRAMUSCULAR | Status: AC
  Filled 2013-03-23: qty 2

## 2013-03-23 MED ORDER — IRBESARTAN 150 MG PO TABS
150.0000 mg | ORAL_TABLET | Freq: Once | ORAL | Status: AC
  Administered 2013-03-23: 150 mg via ORAL
  Filled 2013-03-23: qty 1

## 2013-03-23 MED ORDER — SODIUM CHLORIDE 0.9 % IV SOLN: INTRAVENOUS | Status: DC

## 2013-03-23 MED ORDER — FLEET ENEMA 7-19 GM/118ML RE ENEM
2.0000 | ENEMA | Freq: Once | RECTAL | Status: AC
  Administered 2013-03-23: 2 via RECTAL
  Filled 2013-03-23: qty 2

## 2013-03-23 MED ORDER — IRBESARTAN 300 MG PO TABS
300.0000 mg | ORAL_TABLET | Freq: Every day | ORAL | Status: DC
  Administered 2013-03-24: 300 mg via ORAL
  Filled 2013-03-23: qty 1

## 2013-03-23 MED ORDER — FENTANYL CITRATE 0.05 MG/ML IJ SOLN
INTRAMUSCULAR | Status: AC
  Filled 2013-03-23: qty 2

## 2013-03-23 NOTE — Progress Notes (Signed)
Patient refusing to wear CPAP tonight.  Was told if he changed his mind to call RT. 

## 2013-03-23 NOTE — H&P (View-Only) (Signed)
Consult for [facility]  Reason for Consult: Hematochezia Referring Physician: Crist Infante, M.D.  Robert Mcknight HPI: This is a 80 year old male with a PMH of tubular adenomas (2012), diverticula, and prostate cancer s/p brachytherapy in 2013 who is admitted for complaints of hematochezia.  It started acutely on the day of admission and he subsequently presented to the ER for further evaluation and treatment.  A rectal examination at that time revealed bloody stools.  Over the course of his hospitalization his HGB has not significantly dropped, but today he started to have some more hematochezia.  There was the thought that he may be bleeding from a radiation proctitis.  He had seed implantation in 2013 and his colonoscopy was in 2012.  Past Medical History  Diagnosis Date  . Diabetes mellitus   . Hypertension   . Prostate cancer 2011  . Seasonal allergies   . GERD (gastroesophageal reflux disease)     Past Surgical History  Procedure Laterality Date  . Inguinal hernia repair  1992    left  . Transurethral resection of prostate      Family History  Problem Relation Age of Onset  . Colon cancer Brother 33  . Stomach cancer Neg Hx   . Colon cancer Brother     Social History:  reports that he has never smoked. He has never used smokeless tobacco. He reports that he does not drink alcohol or use illicit drugs.  Allergies: No Known Allergies  Medications:  Scheduled: . atorvastatin  40 mg Oral q1800  . insulin aspart  0-9 Units Subcutaneous TID WC  . irbesartan  150 mg Oral Daily  . olopatadine  1 drop Both Eyes BID  . pantoprazole  40 mg Oral Daily  . sodium chloride  3 mL Intravenous Q12H   Continuous: . 0.9 % NaCl with KCl 20 mEq / L 40 mL/hr at 03/22/13 1500    Results for orders placed during the hospital encounter of 03/20/13 (from the past 24 hour(s))  GLUCOSE, CAPILLARY     Status: Abnormal   Collection Time    03/21/13  5:15 PM      Result Value Ref Range    Glucose-Capillary 119 (*) 70 - 99 mg/dL   Comment 1 Documented in Chart     Comment 2 Notify RN    GLUCOSE, CAPILLARY     Status: Abnormal   Collection Time    03/21/13  9:23 PM      Result Value Ref Range   Glucose-Capillary 117 (*) 70 - 99 mg/dL  CBC     Status: Abnormal   Collection Time    03/22/13  5:15 AM      Result Value Ref Range   WBC 8.2  4.0 - 10.5 K/uL   RBC 4.02 (*) 4.22 - 5.81 MIL/uL   Hemoglobin 11.6 (*) 13.0 - 17.0 g/dL   HCT 34.8 (*) 39.0 - 52.0 %   MCV 86.6  78.0 - 100.0 fL   MCH 28.9  26.0 - 34.0 pg   MCHC 33.3  30.0 - 36.0 g/dL   RDW 15.9 (*) 11.5 - 15.5 %   Platelets 159  150 - 400 K/uL  COMPREHENSIVE METABOLIC PANEL     Status: Abnormal   Collection Time    03/22/13  5:15 AM      Result Value Ref Range   Sodium 138  137 - 147 mEq/L   Potassium 3.6 (*) 3.7 - 5.3 mEq/L   Chloride  104  96 - 112 mEq/L   CO2 23  19 - 32 mEq/L   Glucose, Bld 115 (*) 70 - 99 mg/dL   BUN 18  6 - 23 mg/dL   Creatinine, Ser 1.11  0.50 - 1.35 mg/dL   Calcium 8.5  8.4 - 10.5 mg/dL   Total Protein 5.5 (*) 6.0 - 8.3 g/dL   Albumin 2.9 (*) 3.5 - 5.2 g/dL   AST 12  0 - 37 U/L   ALT 9  0 - 53 U/L   Alkaline Phosphatase 48  39 - 117 U/L   Total Bilirubin 1.1  0.3 - 1.2 mg/dL   GFR calc non Af Amer 62 (*) >90 mL/min   GFR calc Af Amer 71 (*) >90 mL/min  GLUCOSE, CAPILLARY     Status: Abnormal   Collection Time    03/22/13  8:37 AM      Result Value Ref Range   Glucose-Capillary 115 (*) 70 - 99 mg/dL  GLUCOSE, CAPILLARY     Status: Abnormal   Collection Time    03/22/13 12:21 PM      Result Value Ref Range   Glucose-Capillary 147 (*) 70 - 99 mg/dL     No results found.  ROS:  As stated above in the HPI otherwise negative.  Blood pressure 184/80, pulse 70, temperature 97.7 F (36.5 C), temperature source Oral, resp. rate 18, height 5\' 9"  (1.753 m), weight 194 lb 7.1 oz (88.2 kg), SpO2 100.00%.    PE: Gen: NAD, Alert and Oriented HEENT:  [place]/AT, EOMI Neck: Supple, no  LAD Lungs: CTA Bilaterally CV: RRR without M/G/R ABM: Soft, NTND, +BS Ext: No C/C/E  Assessment/Plan: 1) Hematochezia. 2) History of diverticula. 3) S/p brachytherapy for prostate cancer.   With his continued hematochezia and his prior history of brachytherapy, it will be worthwhile to perform a FFS.  If he has a radiation proctitis, this issue can be treated with APC.  Plan: 1) FFS with possible APC tomorrow AM.  Carol Ada D 03/22/2013, 4:46 PM

## 2013-03-23 NOTE — Progress Notes (Signed)
Pt BP remains elevated despite receiving Avapro this AM. Lopressor held d/t report of HR 30s overnight. HR low 50s this AM. Per pharmacist, Remigio Eisenmenger, pt has not been receiving home doses of HCTZ and Omesartan. Dr Joylene Draft present and aware. States he will address after seeing pt.

## 2013-03-23 NOTE — Op Note (Signed)
[facility] [place], [address]   FLEXIBLE SIGMOIDOSCOPY PROCEDURE REPORT  PATIENT: Robert, Mcknight  MR#: [id number] BIRTHDATE: May 13, 1933 , 80  yrs. old GENDER: Male ENDOSCOPIST: Carol Ada, MD REFERRED BY: Crist Infante, M.D. PROCEDURE DATE:  03/23/2013 PROCEDURE:   FFS ASA CLASS:   Class II INDICATIONS:Hematochezia MEDICATIONS: None  DESCRIPTION OF PROCEDURE:   After the risks benefits and alternatives of the procedure were thoroughly explained, informed consent was obtained.  revealed no abnormalities of the rectum. The endoscope was introduced through the anus  and advanced to the , limited by No adverse events experienced.   The quality of the prep was good .  The instrument was then slowly withdrawn as the mucosa was fully examined.         FINDINGS: The colonoscope was advanced to the sigmoid colon and there was evidence of blood mixed with stool.  No evidence of any masses, polyps, or erosions, or ulcerations.  The most distal portion of the rectum exhibited a very mild radiation proctitis. The scope was then withdrawn from the patient and the procedure terminated.  COMPLICATIONS: There were no complications.  ENDOSCOPIC IMPRESSION: 1) Minimal radiation proctitis. 2) Diverticular bleed.  RECOMMENDATIONS: 1) Follow HGB. 2) Transfuse as necessary.  REPEAT EXAM:   _______________________________ eSignedCarol Ada, MD 03/23/2013 10:37 AM   CC:

## 2013-03-23 NOTE — Interval H&P Note (Signed)
History and Physical Interval Note:  03/23/2013 10:33 AM  Robert Mcknight  has presented today for surgery, with the diagnosis of Hematochezia  The various methods of treatment have been discussed with the patient and family. After consideration of risks, benefits and other options for treatment, the patient has consented to  Procedure(s): FLEXIBLE SIGMOIDOSCOPY (N/A) as a surgical intervention .  The patient's history has been reviewed, patient examined, no change in status, stable for surgery.  I have reviewed the patient's chart and labs.  Questions were answered to the patient's satisfaction.     Berkley Cronkright D

## 2013-03-23 NOTE — Progress Notes (Signed)
Subjective: Appreciate dr. Benson Norway.   Colonoscopy shows that this is probably a diverticular bleed but mild radiation proctitis was seen. He has htn and bp up and he has had signficant bradycardia on tele with hr down into the the 30's and 40s at times.  No pain. No further gi bleeding.  Objective: Vital signs in last 24 hours: Temp:  [97.5 F (36.4 C)-98 F (36.7 C)] 98 F (36.7 C) (03/01 1315) Pulse Rate:  [55-70] 70 (03/01 1315) Resp:  [16-22] 18 (03/01 1315) BP: (161-189)/(68-98) 187/82 mmHg (03/01 1315) SpO2:  [98 %-100 %] 99 % (03/01 1315) Weight change:  Last BM Date: 03/23/13  Intake/Output from previous day: 02/28 0701 - 03/01 0700 In: 2061.6 [P.O.:480; I.V.:1581.6] Out: 375 [Urine:375] Intake/Output this shift: Total I/O In: 480 [P.O.:480] Out: -   General appearance: alert, cooperative and appears stated age Resp: clear to auscultation bilaterally Cardio: regular rate and rhythm, S1, S2 normal, no murmur, click, rub or gallop GI: soft, non-tender; bowel sounds normal; no masses,  no organomegaly Extremities: extremities normal, atraumatic, no cyanosis or edema Neurologic: Grossly normal   Lab Results:  Recent Labs  03/21/13 0410 03/22/13 0515  WBC 7.7 8.2  HGB 12.1* 11.6*  HCT 36.2* 34.8*  PLT 167 159   BMET  Recent Labs  03/22/13 0515  NA 138  K 3.6*  CL 104  CO2 23  GLUCOSE 115*  BUN 18  CREATININE 1.11  CALCIUM 8.5   CMET CMP     Component Value Date/Time   NA 138 03/22/2013 0515   K 3.6* 03/22/2013 0515   CL 104 03/22/2013 0515   CO2 23 03/22/2013 0515   GLUCOSE 115* 03/22/2013 0515   BUN 18 03/22/2013 0515   CREATININE 1.11 03/22/2013 0515   CALCIUM 8.5 03/22/2013 0515   PROT 5.5* 03/22/2013 0515   ALBUMIN 2.9* 03/22/2013 0515   AST 12 03/22/2013 0515   ALT 9 03/22/2013 0515   ALKPHOS 48 03/22/2013 0515   BILITOT 1.1 03/22/2013 0515   GFRNONAA 62* 03/22/2013 0515   GFRAA 71* 03/22/2013 0515     Studies/Results: No results  found.  Medications: I have reviewed the patient's current medications.  [name] Kitchen atorvastatin  40 mg Oral q1800  . insulin aspart  0-9 Units Subcutaneous TID WC  . irbesartan  150 mg Oral Daily  . metoprolol tartrate  12.5 mg Oral BID  . olopatadine  1 drop Both Eyes BID  . pantoprazole  40 mg Oral Daily  . sodium chloride  3 mL Intravenous Q12H   CBG (last 3)   Recent Labs  03/22/13 2127 03/23/13 0750 03/23/13 1156  GLUCAP 153* 107* 100*      Assessment/Plan:  Active Problems:   Diverticular hemorrhage-recheck h/h in the a.m., then hopefully home.  Stay of aspirin.   Type II or unspecified type diabetes mellitus without mention of complication, not stated as uncontrolled-sugars controlled.   Essential hypertension, benign-not controlled.  He is on 40-12.5 benicar /hctz at home, and he is on metoprolol 25 mg one daily in a.m. AND propranolol 10 mg one daily each a.m. At home.  Given bradycardia we will d/c beta blocker. We will resume hctz and inc . avapro to 300 mg daily  Here in the hospital to approximate the 40 mg benicar he is on. Amlodipine may be a good choice for him in the future if bp stays up.   Other and unspecified hyperlipidemia-follow.   LOS: 3 days   Robert Ly, MD  03/23/2013, 1:49 PM

## 2013-03-24 ENCOUNTER — Encounter (HOSPITAL_COMMUNITY): Payer: Self-pay | Admitting: Gastroenterology

## 2013-03-24 DIAGNOSIS — R001 Bradycardia, unspecified: Secondary | ICD-10-CM | POA: Diagnosis not present

## 2013-03-24 DIAGNOSIS — G4733 Obstructive sleep apnea (adult) (pediatric): Secondary | ICD-10-CM | POA: Diagnosis present

## 2013-03-24 LAB — CBC WITH DIFFERENTIAL/PLATELET
BASOS ABS: 0 10*3/uL (ref 0.0–0.1)
Basophils Relative: 0 % (ref 0–1)
EOS PCT: 4 % (ref 0–5)
Eosinophils Absolute: 0.3 10*3/uL (ref 0.0–0.7)
HEMATOCRIT: 37.1 % — AB (ref 39.0–52.0)
Hemoglobin: 12.4 g/dL — ABNORMAL LOW (ref 13.0–17.0)
LYMPHS ABS: 2.3 10*3/uL (ref 0.7–4.0)
Lymphocytes Relative: 33 % (ref 12–46)
MCH: 29 pg (ref 26.0–34.0)
MCHC: 33.4 g/dL (ref 30.0–36.0)
MCV: 86.7 fL (ref 78.0–100.0)
MONO ABS: 0.5 10*3/uL (ref 0.1–1.0)
Monocytes Relative: 8 % (ref 3–12)
Neutro Abs: 3.9 10*3/uL (ref 1.7–7.7)
Neutrophils Relative %: 56 % (ref 43–77)
Platelets: 177 10*3/uL (ref 150–400)
RBC: 4.28 MIL/uL (ref 4.22–5.81)
RDW: 16 % — AB (ref 11.5–15.5)
WBC: 7 10*3/uL (ref 4.0–10.5)

## 2013-03-24 LAB — BASIC METABOLIC PANEL
BUN: 17 mg/dL (ref 6–23)
CALCIUM: 8.6 mg/dL (ref 8.4–10.5)
CO2: 23 mEq/L (ref 19–32)
CREATININE: 1.06 mg/dL (ref 0.50–1.35)
Chloride: 103 mEq/L (ref 96–112)
GFR calc Af Amer: 76 mL/min — ABNORMAL LOW (ref >90)
GFR calc non Af Amer: 65 mL/min — ABNORMAL LOW (ref >90)
Glucose, Bld: 135 mg/dL — ABNORMAL HIGH (ref 70–99)
Potassium: 3.6 mEq/L — ABNORMAL LOW (ref 3.7–5.3)
Sodium: 136 mEq/L — ABNORMAL LOW (ref 137–147)

## 2013-03-24 LAB — GLUCOSE, CAPILLARY: Glucose-Capillary: 117 mg/dL — ABNORMAL HIGH (ref 70–99)

## 2013-03-24 MED ORDER — AMLODIPINE BESYLATE 5 MG PO TABS: 5.0000 mg | ORAL_TABLET | Freq: Every day | ORAL | Status: DC

## 2013-03-24 NOTE — Progress Notes (Signed)
D/C instructions reviewed w/ pt and wife. Both verbalize understanding, all questions answered. Pt d/c in w/c in stable condition by NT. Pt in possession of d/c instructions and all personal belongings. Script called into pt's pharmacy [facility] on [address] in [place].

## 2013-03-24 NOTE — Discharge Summary (Signed)
Physician Discharge Summary  Patient ID: Robert Mcknight MRN: [id number] DOB/AGE: 1933-10-21 80 y.o.  Admit date: 03/20/2013 Discharge date: 03/24/2013   Discharge Diagnoses:  Active Problems:   Bradycardia   Obstructive sleep apnea   Diverticular hemorrhage   Type II or unspecified type diabetes mellitus without mention of complication, not stated as uncontrolled   Essential hypertension, benign   Other and unspecified hyperlipidemia   Discharged Condition: good  Hospital Course:  The patient is a 80 year old gentleman with a past medical history significant for type 2 diabetes, with some microvascular complications, prostate cancer, colon polyps and diverticulosis based on colonoscopy from 2012, hypertension, hyperlipidemia, obstructive sleep apnea who presents with 3 episodes of bloody stools that occurred this morning, with no history of similar symptoms, patient denies any significant abdominal pain possibly some mild lower abdominal or low back discomfort which is vaguely defined, denies fevers, chills, nausea, vomiting. Last episode of bloody stools approximately the 10-11 AM this morning, currently at 5 PM, no further symptomatology, patient presented to the emergency room, hemodynamically stable, hemoglobin stable and reasonable, however rectal exam did reveal significant bloody stool in the vault per emergency room physician, patient is now being in admitted for further evaluation and management of a possible diverticular bleed. Patient is hemodynamically stable.  Initially, his bleeding stopped, but then he had some low volume rectal bleeding so a GI consultation was requested. Dr. Benson Norway did a flexible sigmoidoscopy that showed very minimal radiation proctitis and no polyps. He felt that the bleeding was due to a diverticular hemorrhage. The bleeding stopped with discontinuation of aspirin. Telemetry showed marked sinus bradycardia with heart rates in the 30s, so his beta blocker was  stopped. On the day of discharge he felt fine with no dizziness, abdominal pain, or rectal bleeding. There were no complications from his hospitalization.   Consults: GI  Significant Diagnostic Studies:  No results found.  Labs: Lab Results  Component Value Date   WBC 7.0 03/24/2013   HGB 12.4* 03/24/2013   HCT 37.1* 03/24/2013   MCV 86.7 03/24/2013   PLT 177 03/24/2013     Recent Labs Lab 03/22/13 0515 03/24/13 0405  NA 138 136*  K 3.6* 3.6*  CL 104 103  CO2 23 23  BUN 18 17  CREATININE 1.11 1.06  CALCIUM 8.5 8.6  PROT 5.5*  --   BILITOT 1.1  --   ALKPHOS 48  --   ALT 9  --   AST 12  --   GLUCOSE 115* 135*       Lab Results  Component Value Date   INR 0.93 12/30/2009     No results found for this or any previous visit (from the past 240 hour(s)).    Discharge Exam: Blood pressure 180/88, pulse 63, temperature 98 F (36.7 C), temperature source Oral, resp. rate 18, height 5\' 9"  (1.753 m), weight 88.2 kg (194 lb 7.1 oz), SpO2 99.00%.  Physical Exam: In general he is a well nourished and well developed man of African-American descent who was in no apparent distress. HEENT exam was normal, neck was supple, chest was clear, heart had a regular rate and rhythm, abdomen had normal bowel sounds and no tenderness, extremities were without edema, and neurological exam was nonfocal.   Disposition: He will be discharged to home and should avoid all aspirin containing products, should use his CPAP every night.   Discharge Orders   Future Orders Complete By Expires   Call MD for:  As  directed    Comments:     Call physician for significant rectal bleeding, dizziness, nausea, difficulty breathing, or any other concerning symptoms   Diet - low sodium heart healthy  As directed    Discharge instructions  As directed    Comments:     You will be discharged to home today, should stop metoprolol and propranolol due to very low heart rates seen in the hospital and start amlodipine for  blood pressure. Use your CPAP every night.   Increase activity slowly  As directed        Medication List    STOP taking these medications       aspirin EC 81 MG tablet     metoprolol tartrate 25 MG tablet  Commonly known as:  LOPRESSOR     propranolol 10 MG tablet  Commonly known as:  INDERAL      TAKE these medications       amLODipine 5 MG tablet  Commonly known as:  NORVASC  Take 1 tablet (5 mg total) by mouth daily.     BEPREVE 1.5 % Soln  Generic drug:  Bepotastine Besilate  Place 1 drop into both eyes 2 (two) times daily as needed. Vision not clear     CALCIUM CARBONATE-VITAMIN D3 PO  Take 1 tablet by mouth 2 (two) times daily. Calcium 600 mg and Vitamin  D 800 units     Fish Oil 1000 MG Cpdr  Take 2 capsules by mouth 2 (two) times daily.     metFORMIN 500 MG (MOD) 24 hr tablet  Commonly known as:  GLUMETZA  Take 500 mg by mouth 2 (two) times daily with a meal.     naproxen sodium 220 MG tablet  Commonly known as:  ANAPROX  Take 220 mg by mouth 2 (two) times daily as needed (pain).     olmesartan-hydrochlorothiazide 40-12.5 MG per tablet  Commonly known as:  BENICAR HCT  Take 1 tablet by mouth daily.     omeprazole 20 MG capsule  Commonly known as:  PRILOSEC  Take 20 mg by mouth daily.     oxybutynin 5 MG tablet  Commonly known as:  DITROPAN  Take 5 mg by mouth daily.     Potassium 99 MG Tabs  Take 99 mg by mouth 2 (two) times daily.     simvastatin 80 MG tablet  Commonly known as:  ZOCOR  Take 80 mg by mouth at bedtime.     vitamin E 1000 UNIT capsule  Generic drug:  vitamin E  Take 1,000 Units by mouth daily.           Follow-up Information   Follow up with Donnajean Lopes, MD. Schedule an appointment as soon as possible for a visit in 1 week.   Specialty:  Internal Medicine   Contact information:   [place] [address] [phone]       Signed: Donnajean Lopes 03/24/2013, 7:48 AM

## 2013-06-13 ENCOUNTER — Ambulatory Visit (INDEPENDENT_AMBULATORY_CARE_PROVIDER_SITE_OTHER): Payer: MEDICARE | Admitting: Urology

## 2013-06-13 DIAGNOSIS — N3941 Urge incontinence: Secondary | ICD-10-CM

## 2013-09-05 ENCOUNTER — Ambulatory Visit (INDEPENDENT_AMBULATORY_CARE_PROVIDER_SITE_OTHER): Payer: MEDICARE | Admitting: Urology

## 2013-09-05 DIAGNOSIS — Z8546 Personal history of malignant neoplasm of prostate: Secondary | ICD-10-CM

## 2014-03-06 ENCOUNTER — Ambulatory Visit (INDEPENDENT_AMBULATORY_CARE_PROVIDER_SITE_OTHER): Payer: BLUE CROSS/BLUE SHIELD | Admitting: Urology

## 2014-03-06 DIAGNOSIS — Z8546 Personal history of malignant neoplasm of prostate: Secondary | ICD-10-CM

## 2014-03-06 DIAGNOSIS — N3941 Urge incontinence: Secondary | ICD-10-CM | POA: Diagnosis not present

## 2014-05-15 ENCOUNTER — Ambulatory Visit: Payer: Medicare Other | Admitting: Urology

## 2014-06-19 ENCOUNTER — Ambulatory Visit (INDEPENDENT_AMBULATORY_CARE_PROVIDER_SITE_OTHER): Payer: BLUE CROSS/BLUE SHIELD | Admitting: Urology

## 2014-06-19 DIAGNOSIS — N3941 Urge incontinence: Secondary | ICD-10-CM

## 2014-06-19 DIAGNOSIS — Z8546 Personal history of malignant neoplasm of prostate: Secondary | ICD-10-CM

## 2014-06-19 DIAGNOSIS — C61 Malignant neoplasm of prostate: Secondary | ICD-10-CM

## 2014-06-19 DIAGNOSIS — Z87898 Personal history of other specified conditions: Secondary | ICD-10-CM | POA: Diagnosis not present

## 2014-07-20 ENCOUNTER — Other Ambulatory Visit: Payer: Self-pay

## 2014-09-18 ENCOUNTER — Ambulatory Visit (INDEPENDENT_AMBULATORY_CARE_PROVIDER_SITE_OTHER): Payer: Medicare Other | Admitting: Urology

## 2014-09-18 DIAGNOSIS — Z8546 Personal history of malignant neoplasm of prostate: Secondary | ICD-10-CM | POA: Diagnosis not present

## 2014-09-18 DIAGNOSIS — N3941 Urge incontinence: Secondary | ICD-10-CM

## 2014-09-18 DIAGNOSIS — C61 Malignant neoplasm of prostate: Secondary | ICD-10-CM

## 2014-12-01 ENCOUNTER — Other Ambulatory Visit: Payer: Self-pay | Admitting: Otolaryngology

## 2014-12-01 DIAGNOSIS — R442 Other hallucinations: Secondary | ICD-10-CM

## 2014-12-14 ENCOUNTER — Ambulatory Visit
Admission: RE | Admit: 2014-12-14 | Discharge: 2014-12-14 | Disposition: A | Discharge status: Home / Self Care | Payer: Medicare Other | Source: Ambulatory Visit | Attending: Otolaryngology | Admitting: Otolaryngology

## 2014-12-14 DIAGNOSIS — R442 Other hallucinations: Secondary | ICD-10-CM

## 2014-12-14 MED ORDER — GADOBENATE DIMEGLUMINE 529 MG/ML IV SOLN
19.0000 mL | Freq: Once | INTRAVENOUS | Status: AC | PRN
  Administered 2014-12-14: 19 mL via INTRAVENOUS

## 2015-02-01 ENCOUNTER — Other Ambulatory Visit: Payer: Self-pay | Admitting: Otolaryngology

## 2015-02-01 HISTORY — PX: NASAL SINUS SURGERY: SHX719

## 2015-02-25 ENCOUNTER — Encounter (HOSPITAL_COMMUNITY): Payer: Self-pay | Admitting: Family Medicine

## 2015-02-25 ENCOUNTER — Inpatient Hospital Stay (HOSPITAL_COMMUNITY)
Admission: AD | Admit: 2015-02-25 | Discharge: 2015-03-01 | DRG: 287 | Disposition: A | Discharge status: Home / Self Care | Payer: Medicare Other | Source: Other Acute Inpatient Hospital | Attending: Internal Medicine | Admitting: Internal Medicine

## 2015-02-25 DIAGNOSIS — E1122 Type 2 diabetes mellitus with diabetic chronic kidney disease: Secondary | ICD-10-CM | POA: Diagnosis present

## 2015-02-25 DIAGNOSIS — I350 Nonrheumatic aortic (valve) stenosis: Secondary | ICD-10-CM | POA: Diagnosis present

## 2015-02-25 DIAGNOSIS — I471 Supraventricular tachycardia, unspecified: Secondary | ICD-10-CM

## 2015-02-25 DIAGNOSIS — K219 Gastro-esophageal reflux disease without esophagitis: Secondary | ICD-10-CM | POA: Diagnosis present

## 2015-02-25 DIAGNOSIS — Z800 Family history of malignant neoplasm of digestive organs: Secondary | ICD-10-CM

## 2015-02-25 DIAGNOSIS — R072 Precordial pain: Secondary | ICD-10-CM | POA: Diagnosis not present

## 2015-02-25 DIAGNOSIS — Z8349 Family history of other endocrine, nutritional and metabolic diseases: Secondary | ICD-10-CM | POA: Diagnosis not present

## 2015-02-25 DIAGNOSIS — N183 Chronic kidney disease, stage 3 (moderate): Secondary | ICD-10-CM | POA: Diagnosis present

## 2015-02-25 DIAGNOSIS — I10 Essential (primary) hypertension: Secondary | ICD-10-CM | POA: Diagnosis not present

## 2015-02-25 DIAGNOSIS — R0600 Dyspnea, unspecified: Secondary | ICD-10-CM

## 2015-02-25 DIAGNOSIS — E118 Type 2 diabetes mellitus with unspecified complications: Secondary | ICD-10-CM

## 2015-02-25 DIAGNOSIS — E785 Hyperlipidemia, unspecified: Secondary | ICD-10-CM | POA: Diagnosis present

## 2015-02-25 DIAGNOSIS — Z8546 Personal history of malignant neoplasm of prostate: Secondary | ICD-10-CM

## 2015-02-25 DIAGNOSIS — B49 Unspecified mycosis: Secondary | ICD-10-CM | POA: Insufficient documentation

## 2015-02-25 DIAGNOSIS — G4733 Obstructive sleep apnea (adult) (pediatric): Secondary | ICD-10-CM | POA: Diagnosis present

## 2015-02-25 DIAGNOSIS — Z7984 Long term (current) use of oral hypoglycemic drugs: Secondary | ICD-10-CM

## 2015-02-25 DIAGNOSIS — R011 Cardiac murmur, unspecified: Secondary | ICD-10-CM | POA: Diagnosis present

## 2015-02-25 DIAGNOSIS — R079 Chest pain, unspecified: Secondary | ICD-10-CM | POA: Diagnosis not present

## 2015-02-25 DIAGNOSIS — J302 Other seasonal allergic rhinitis: Secondary | ICD-10-CM | POA: Diagnosis present

## 2015-02-25 DIAGNOSIS — N179 Acute kidney failure, unspecified: Secondary | ICD-10-CM | POA: Diagnosis present

## 2015-02-25 DIAGNOSIS — R000 Tachycardia, unspecified: Secondary | ICD-10-CM | POA: Diagnosis not present

## 2015-02-25 DIAGNOSIS — Z7982 Long term (current) use of aspirin: Secondary | ICD-10-CM | POA: Diagnosis not present

## 2015-02-25 DIAGNOSIS — I129 Hypertensive chronic kidney disease with stage 1 through stage 4 chronic kidney disease, or unspecified chronic kidney disease: Secondary | ICD-10-CM | POA: Diagnosis present

## 2015-02-25 DIAGNOSIS — E119 Type 2 diabetes mellitus without complications: Secondary | ICD-10-CM

## 2015-02-25 DIAGNOSIS — E876 Hypokalemia: Secondary | ICD-10-CM | POA: Diagnosis present

## 2015-02-25 DIAGNOSIS — I4892 Unspecified atrial flutter: Secondary | ICD-10-CM | POA: Diagnosis present

## 2015-02-25 DIAGNOSIS — I209 Angina pectoris, unspecified: Secondary | ICD-10-CM | POA: Diagnosis not present

## 2015-02-25 HISTORY — DX: Sleep apnea, unspecified: G47.30

## 2015-02-25 HISTORY — DX: Cardiac murmur, unspecified: R01.1

## 2015-02-25 LAB — GLUCOSE, CAPILLARY: Glucose-Capillary: 167 mg/dL — ABNORMAL HIGH (ref 65–99)

## 2015-02-25 LAB — APTT: aPTT: 32 seconds (ref 24–37)

## 2015-02-25 LAB — PROTIME-INR
INR: 1.07 (ref 0.00–1.49)
Prothrombin Time: 14.1 seconds (ref 11.6–15.2)

## 2015-02-25 MED ORDER — VITAMIN E 180 MG (400 UNIT) PO CAPS
800.0000 [IU] | ORAL_CAPSULE | Freq: Every day | ORAL | Status: DC
  Administered 2015-02-26 – 2015-03-01 (×4): 800 [IU] via ORAL
  Filled 2015-02-25 (×4): qty 2

## 2015-02-25 MED ORDER — INSULIN ASPART 100 UNIT/ML ~~LOC~~ SOLN
0.0000 [IU] | Freq: Three times a day (TID) | SUBCUTANEOUS | Status: DC
  Administered 2015-02-26: 2 [IU] via SUBCUTANEOUS
  Administered 2015-02-27 – 2015-02-28 (×4): 1 [IU] via SUBCUTANEOUS

## 2015-02-25 MED ORDER — ONDANSETRON HCL 4 MG/2ML IJ SOLN: 4.0000 mg | Freq: Four times a day (QID) | INTRAMUSCULAR | Status: DC | PRN

## 2015-02-25 MED ORDER — ACETAMINOPHEN 325 MG PO TABS: 650.0000 mg | ORAL_TABLET | ORAL | Status: DC | PRN

## 2015-02-25 MED ORDER — DILTIAZEM HCL 30 MG PO TABS
30.0000 mg | ORAL_TABLET | Freq: Four times a day (QID) | ORAL | Status: DC
  Administered 2015-02-25 – 2015-02-26 (×2): 30 mg via ORAL
  Filled 2015-02-25 (×2): qty 1

## 2015-02-25 MED ORDER — HEPARIN (PORCINE) IN NACL 100-0.45 UNIT/ML-% IJ SOLN
1250.0000 [IU]/h | INTRAMUSCULAR | Status: DC
  Administered 2015-02-25: 1250 [IU]/h via INTRAVENOUS
  Filled 2015-02-25: qty 250

## 2015-02-25 MED ORDER — ASPIRIN EC 81 MG PO TBEC
81.0000 mg | DELAYED_RELEASE_TABLET | Freq: Every day | ORAL | Status: DC
  Administered 2015-02-26 – 2015-03-01 (×4): 81 mg via ORAL
  Filled 2015-02-25 (×4): qty 1

## 2015-02-25 MED ORDER — AMLODIPINE BESYLATE 5 MG PO TABS: 5.0000 mg | ORAL_TABLET | Freq: Every day | ORAL | Status: DC

## 2015-02-25 MED ORDER — HEPARIN BOLUS VIA INFUSION
4000.0000 [IU] | Freq: Once | INTRAVENOUS | Status: AC
  Administered 2015-02-25: 4000 [IU] via INTRAVENOUS
  Filled 2015-02-25: qty 4000

## 2015-02-25 MED ORDER — OLOPATADINE HCL 0.1 % OP SOLN
1.0000 [drp] | Freq: Two times a day (BID) | OPHTHALMIC | Status: DC
  Administered 2015-02-25 – 2015-03-01 (×8): 1 [drp] via OPHTHALMIC
  Filled 2015-02-25: qty 5

## 2015-02-25 MED ORDER — CALCIUM CARBONATE-VITAMIN D 500-200 MG-UNIT PO TABS
ORAL_TABLET | Freq: Two times a day (BID) | ORAL | Status: DC
  Administered 2015-02-26 – 2015-03-01 (×8): 1 via ORAL
  Filled 2015-02-25 (×8): qty 1

## 2015-02-25 MED ORDER — OMEGA-3-ACID ETHYL ESTERS 1 G PO CAPS
1.0000 g | ORAL_CAPSULE | Freq: Every day | ORAL | Status: DC
  Administered 2015-02-26 – 2015-03-01 (×4): 1 g via ORAL
  Filled 2015-02-25 (×4): qty 1

## 2015-02-25 MED ORDER — PANTOPRAZOLE SODIUM 40 MG PO TBEC
40.0000 mg | DELAYED_RELEASE_TABLET | Freq: Every day | ORAL | Status: DC
  Administered 2015-02-26 – 2015-03-01 (×4): 40 mg via ORAL
  Filled 2015-02-25 (×4): qty 1

## 2015-02-25 NOTE — H&P (Signed)
[facility] History and Physical  Robert Mcknight [id number] DOB: 1934/01/01 DOA: 02/25/2015  Referring physician: ED physician PCP: Donnajean Lopes, MD  Specialists:  Dr. Tammi Klippel (radiation oncology)   Chief Complaint:  Chest tightness, palpitations, dyspnea  HPI: Robert Mcknight is a 82 y.o. male with PMH of essential hypertension, type 2 diabetes, and obstructive sleep apnea who presented to [facility] with several days of chest tightness with palpitations and dyspnea. Patient reports that he had been in his usual state of health with no recent fever, chills, nausea, vomiting, or diarrhea. Approximately 3 days ago, he began to complain of transient episodes marked by left-sided chest tightness with palpitations and mild dyspnea. His episodes resolve spontaneously over the course of several minutes. He is unable to identify any exacerbating or alleviating factors. Patient denies any history of similar symptoms, denies alcohol use, denies illicit drug use, drinks 1 cup of coffee each day, and denies personal history of thyroid disease. There is a family history of thyroid disease. Patient is a nonsmoker with no known underlying cardiopulmonary disease. There is no family history of premature heart disease. On the day of his presentation, patient developed palpitations as before, but it was more persistent this time, prompting his presentation to the outside ED.  At the outside hospital, patient was noted to be afebrile and saturating well on room air with heart rate of approximately 150. EKG demonstrated a sinus tachycardia initially, per report he converted into an atrial tachyarrhythmia in the ED. EKG was sent with the patient, demonstrating a rapid regular rhythm without P waves. He was given a 10 mg IV push of diltiazem with transient returned to sinus rhythm. A second, and third IV push of diltiazem were administered at the outside ED as the patient would go back into the  atrial tachyarrhythmia. His blood pressure remained modestly elevated throughout his stay at the outside facility. Upon his arrival to [facility], patient is afebrile, saturating well on room air, with sinus bradycardia, rate 55. Initial blood work is notable for a serum creatinine of 1.5 with no known baseline. Patient's hyperglycemic to 221, troponin is undetectable, and the remainder of the chem panel and CBC are largely unremarkable. Patient will be admitted to telemetry for ongoing evaluation and management of new tachyarrhythmia, suspicious for atrial flutter.  Where does patient live?   At home     Can patient participate in ADLs?  Yes         Review of Systems:   General: no fevers, chills, sweats, weight change, poor appetite, or fatigue HEENT: no blurry vision, hearing changes or sore throat Pulm: no dyspnea, cough, or wheeze CV:  See HPI Abd: no nausea, vomiting, abdominal pain, diarrhea, or constipation GU: no dysuria, hematuria, increased urinary frequency, or urgency  Ext: no leg edema Neuro: no focal weakness, numbness, or tingling, no vision change or hearing loss Skin: no rash, no wounds MSK: No muscle spasm, no deformity, no red, hot, or swollen joint Heme: No easy bruising or bleeding Travel history: No recent long distant travel    Allergy: No Known Allergies  Past Medical History  Diagnosis Date  . Diabetes mellitus   . Hypertension   . Prostate cancer ([place]) 2011  . Seasonal allergies   . GERD (gastroesophageal reflux disease)     Past Surgical History  Procedure Laterality Date  . Inguinal hernia repair  1992    left  . Transurethral resection of prostate    . Flexible  sigmoidoscopy N/A 03/23/2013    Procedure: FLEXIBLE SIGMOIDOSCOPY;  Surgeon: Beryle Beams, MD;  Location: [facility];  Service: Endoscopy;  Laterality: N/A;    Social History:  reports that he has never smoked. He has never used smokeless tobacco. He reports that he does not drink alcohol  or use illicit drugs.  Family History:  Family History  Problem Relation Age of Onset  . Colon cancer Brother 50  . Stomach cancer Neg Hx   . Colon cancer Brother      Prior to Admission medications   Medication Sig Start Date End Date Taking? Authorizing Provider  amLODipine (NORVASC) 5 MG tablet Take 1 tablet (5 mg total) by mouth daily. 03/24/13   Leanna Battles, MD  Bepotastine Besilate (BEPREVE) 1.5 % SOLN Place 1 drop into both eyes 2 (two) times daily as needed. Vision not clear    Historical Provider, MD  Calcium Carb-Cholecalciferol (CALCIUM CARBONATE-VITAMIN D3 PO) Take 1 tablet by mouth 2 (two) times daily. Calcium 600 mg and Vitamin  D 800 units    Historical Provider, MD  metFORMIN (GLUMETZA) 500 MG (MOD) 24 hr tablet Take 500 mg by mouth 2 (two) times daily with a meal.    Historical Provider, MD  naproxen sodium (ANAPROX) 220 MG tablet Take 220 mg by mouth 2 (two) times daily as needed (pain).    Historical Provider, MD  olmesartan-hydrochlorothiazide (BENICAR HCT) 40-12.5 MG per tablet Take 1 tablet by mouth daily.      Historical Provider, MD  Omega-3 Fatty Acids (FISH OIL) 1000 MG CPDR Take 2 capsules by mouth 2 (two) times daily.     Historical Provider, MD  omeprazole (PRILOSEC) 20 MG capsule Take 20 mg by mouth daily.      Historical Provider, MD  oxybutynin (DITROPAN) 5 MG tablet Take 5 mg by mouth daily.    Historical Provider, MD  Potassium 99 MG TABS Take 99 mg by mouth 2 (two) times daily.     Historical Provider, MD  simvastatin (ZOCOR) 80 MG tablet Take 80 mg by mouth at bedtime.      Historical Provider, MD  vitamin E (VITAMIN E) 1000 UNIT capsule Take 1,000 Units by mouth daily.    Historical Provider, MD    Physical Exam: Filed Vitals:   02/25/15 2123  BP: 165/70  Pulse: 55  Temp: 98.2 F (36.8 C)  TempSrc: Oral  Resp: 19  Height: 5\' 9"  (1.753 m)  Weight: 89.676 kg (197 lb 11.2 oz)  SpO2: 99%   General: Not in acute distress HEENT:       Eyes:  PERRL, EOMI, no scleral icterus or conjunctival pallor.       ENT: No discharge from the ears or nose, no pharyngeal ulcers, petechiae or exudate, no tonsillar enlargement.        Neck: No JVD, no bruit, no appreciable mass Heme: No cervical adenopathy, no pallor Cardiac: Rate ~60 and regular, grade II midsystolic murmur at upper sternal borders, No gallops or rubs. Pulm: Good air movement bilaterally. No rales, wheezing, rhonchi or rubs. Abd: Soft, nondistended, nontender, no rebound pain or gaurding, BS present. Ext: No LE edema bilaterally. 2+DP/PT pulse bilaterally. Musculoskeletal: No gross deformity, no red, hot, swollen joints, no limitation in ROM  Skin: No rashes or wounds on exposed surfaces  Neuro: Alert, oriented X3, cranial nerves II-XII grossly intact. No focal findings Psych: Patient is not overtly psychotic, appropriate mood and affect.  Labs on Admission:  Basic Metabolic Panel: No  results for input(s): NA, K, CL, CO2, GLUCOSE, BUN, CREATININE, CALCIUM, MG, PHOS in the last 168 hours. Liver Function Tests: No results for input(s): AST, ALT, ALKPHOS, BILITOT, PROT, ALBUMIN in the last 168 hours. No results for input(s): LIPASE, AMYLASE in the last 168 hours. No results for input(s): AMMONIA in the last 168 hours. CBC: No results for input(s): WBC, NEUTROABS, HGB, HCT, MCV, PLT in the last 168 hours. Cardiac Enzymes: No results for input(s): CKTOTAL, CKMB, CKMBINDEX, TROPONINI in the last 168 hours.  BNP (last 3 results) No results for input(s): BNP in the last 8760 hours.  ProBNP (last 3 results) No results for input(s): PROBNP in the last 8760 hours.  CBG: No results for input(s): GLUCAP in the last 168 hours.  Radiological Exams on Admission: No results found.  EKG: Independently reviewed.  Abnormal findings:   Rate 147 and regular, p-waves absent, no acute ST or Tw change  Assessment/Plan  1. Atrial flutter, new-onset    - Pt presented to OSH with rapid  junctional rhythm, apparently went in and out of rapid a flutter  - Back in sinus on arrival after 10 mg IVP diltiazem x3 at outside hospital  - On heparin infusion with pharmacy consultation  - Monitoring on telemetry  - Start PO diltiazem    - Check thyroid studies, troponin  2. Type II DM  - Hold home metformin while inpt  - CBG with meals and qHS  - Sensitive SSI correctional prn  - A1c pending  - Carb-modified diet as appropriate    3. Hypertension  - Modestly elevated currently  - Starting PO diltiazem - Holding home Benicar given AKI, can resume when SCr stabilizes   4. OSA - CPAP qHS    5. AKI - SCr 1.5, previously 1  - Suspect prerenal etiology given arrhythmias and resulting drop in CO   - Treating underlying arrhythmia as above  - Holding Benicar until renal fxn stabilizes  - Avoiding nephrotoxins where feasible  - Repeat chem panel tomorrow     DVT ppx:  Treatment-dose heparin   Code Status: Full code Family Communication:  Yes, patient's daughter and wife at bed side Disposition Plan: Admit to inpatient   Date of Service 02/25/2015    Vianne Bulls, MD [facility] Pager [phone]  If 7PM-7AM, please contact night-coverage [email or web address] Password [facility] 02/25/2015, 10:43 PM

## 2015-02-25 NOTE — Progress Notes (Signed)
ANTICOAGULATION CONSULT NOTE - Initial Consult  Pharmacy Consult for heparin Indication: atrial fibrillation  No Known Allergies  Patient Measurements: Height: 5\' 9"  (175.3 cm) Weight: 197 lb 11.2 oz (89.676 kg) IBW/kg (Calculated) : 70.7 Heparin Dosing Weight: 88.8 kg  Vital Signs: Temp: 98.2 F (36.8 C) (02/02 2123) Temp Source: Oral (02/02 2123) BP: 165/70 mmHg (02/02 2123) Pulse Rate: 55 (02/02 2123)  Labs: No results for input(s): HGB, HCT, PLT, APTT, LABPROT, INR, HEPARINUNFRC, HEPRLOWMOCWT, CREATININE, CKTOTAL, CKMB, TROPONINI in the last 72 hours.  CrCl cannot be calculated (Patient has no serum creatinine result on file.).   Medical History: Past Medical History  Diagnosis Date  . Diabetes mellitus   . Hypertension   . Prostate cancer ([place]) 2011  . Seasonal allergies   . GERD (gastroesophageal reflux disease)     Medications:  Prescriptions prior to admission  Medication Sig Dispense Refill Last Dose  . amLODipine (NORVASC) 5 MG tablet Take 1 tablet (5 mg total) by mouth daily. 30 tablet 12   . Bepotastine Besilate (BEPREVE) 1.5 % SOLN Place 1 drop into both eyes 2 (two) times daily as needed. Vision not clear   Past Week at Unknown time  . Calcium Carb-Cholecalciferol (CALCIUM CARBONATE-VITAMIN D3 PO) Take 1 tablet by mouth 2 (two) times daily. Calcium 600 mg and Vitamin  D 800 units   03/20/2013 at Unknown time  . metFORMIN (GLUMETZA) 500 MG (MOD) 24 hr tablet Take 500 mg by mouth 2 (two) times daily with a meal.   03/20/2013 at Unknown time  . naproxen sodium (ANAPROX) 220 MG tablet Take 220 mg by mouth 2 (two) times daily as needed (pain).   03/20/2013 at Unknown time  . olmesartan-hydrochlorothiazide (BENICAR HCT) 40-12.5 MG per tablet Take 1 tablet by mouth daily.     03/20/2013 at Unknown time  . Omega-3 Fatty Acids (FISH OIL) 1000 MG CPDR Take 2 capsules by mouth 2 (two) times daily.    03/20/2013 at Unknown time  . omeprazole (PRILOSEC) 20 MG capsule Take 20  mg by mouth daily.     03/20/2013 at Unknown time  . oxybutynin (DITROPAN) 5 MG tablet Take 5 mg by mouth daily.   Past Week at Unknown time  . Potassium 99 MG TABS Take 99 mg by mouth 2 (two) times daily.    03/20/2013 at Unknown time  . simvastatin (ZOCOR) 80 MG tablet Take 80 mg by mouth at bedtime.     Past Month at Unknown time  . vitamin E (VITAMIN E) 1000 UNIT capsule Take 1,000 Units by mouth daily.   03/20/2013 at Unknown time    Assessment: 82 yo M transferred from [facility] in [place]. He did not arrive with heparin drip and RN did not get report that he was on heparin at outside hospital. Pharmacy consulted to dose heparin for afib.  Wt 89.7 kg.  No lab data available.  Pt not on anticoagulants PTA.   Goal of Therapy:  Heparin level 0.3-0.7 units/ml Monitor platelets by anticoagulation protocol: Yes   Plan:  Give 4000 units bolus x 1 Start heparin infusion at 1250 units/hr Check anti-Xa level in 8 hours and daily while on heparin Continue to monitor H&H and platelets  Eudelia Bunch, Pharm.D. QP:[id number] 02/25/2015 10:49 PM

## 2015-02-26 ENCOUNTER — Encounter (HOSPITAL_COMMUNITY)
Admission: AD | Disposition: A | Discharge status: Home / Self Care | Payer: Medicare Other | Source: Other Acute Inpatient Hospital | Attending: Internal Medicine

## 2015-02-26 ENCOUNTER — Encounter (HOSPITAL_COMMUNITY): Payer: Self-pay | Admitting: *Deleted

## 2015-02-26 ENCOUNTER — Inpatient Hospital Stay (HOSPITAL_COMMUNITY): Payer: Medicare Other

## 2015-02-26 DIAGNOSIS — I4892 Unspecified atrial flutter: Secondary | ICD-10-CM

## 2015-02-26 DIAGNOSIS — E1122 Type 2 diabetes mellitus with diabetic chronic kidney disease: Secondary | ICD-10-CM

## 2015-02-26 DIAGNOSIS — E876 Hypokalemia: Secondary | ICD-10-CM | POA: Diagnosis present

## 2015-02-26 DIAGNOSIS — N183 Chronic kidney disease, stage 3 (moderate): Secondary | ICD-10-CM

## 2015-02-26 DIAGNOSIS — I209 Angina pectoris, unspecified: Secondary | ICD-10-CM

## 2015-02-26 DIAGNOSIS — I10 Essential (primary) hypertension: Secondary | ICD-10-CM

## 2015-02-26 DIAGNOSIS — G4733 Obstructive sleep apnea (adult) (pediatric): Secondary | ICD-10-CM

## 2015-02-26 DIAGNOSIS — E785 Hyperlipidemia, unspecified: Secondary | ICD-10-CM

## 2015-02-26 DIAGNOSIS — R072 Precordial pain: Secondary | ICD-10-CM

## 2015-02-26 DIAGNOSIS — R000 Tachycardia, unspecified: Secondary | ICD-10-CM

## 2015-02-26 HISTORY — PX: CARDIAC CATHETERIZATION: SHX172

## 2015-02-26 LAB — COMPREHENSIVE METABOLIC PANEL
ALT: 11 U/L — ABNORMAL LOW (ref 17–63)
AST: 18 U/L (ref 15–41)
Albumin: 2.9 g/dL — ABNORMAL LOW (ref 3.5–5.0)
Alkaline Phosphatase: 51 U/L (ref 38–126)
Anion gap: 10 (ref 5–15)
BILIRUBIN TOTAL: 1.1 mg/dL (ref 0.3–1.2)
BUN: 12 mg/dL (ref 6–20)
CHLORIDE: 106 mmol/L (ref 101–111)
CO2: 25 mmol/L (ref 22–32)
CREATININE: 1.29 mg/dL — AB (ref 0.61–1.24)
Calcium: 9 mg/dL (ref 8.9–10.3)
GFR, EST AFRICAN AMERICAN: 59 mL/min — AB (ref >60)
GFR, EST NON AFRICAN AMERICAN: 51 mL/min — AB (ref >60)
Glucose, Bld: 129 mg/dL — ABNORMAL HIGH (ref 65–99)
POTASSIUM: 3.3 mmol/L — AB (ref 3.5–5.1)
Sodium: 141 mmol/L (ref 135–145)
TOTAL PROTEIN: 5.8 g/dL — AB (ref 6.5–8.1)

## 2015-02-26 LAB — TSH: TSH: 2.125 u[IU]/mL (ref 0.350–4.500)

## 2015-02-26 LAB — CBC WITH DIFFERENTIAL/PLATELET
BASOS ABS: 0 10*3/uL (ref 0.0–0.1)
Basophils Relative: 0 %
EOS ABS: 0.3 10*3/uL (ref 0.0–0.7)
EOS PCT: 3 %
HCT: 36.9 % — ABNORMAL LOW (ref 39.0–52.0)
HEMOGLOBIN: 12.4 g/dL — AB (ref 13.0–17.0)
LYMPHS PCT: 36 %
Lymphs Abs: 3 10*3/uL (ref 0.7–4.0)
MCH: 27.6 pg (ref 26.0–34.0)
MCHC: 33.6 g/dL (ref 30.0–36.0)
MCV: 82.2 fL (ref 78.0–100.0)
Monocytes Absolute: 0.7 10*3/uL (ref 0.1–1.0)
Monocytes Relative: 9 %
NEUTROS PCT: 52 %
Neutro Abs: 4.3 10*3/uL (ref 1.7–7.7)
PLATELETS: 195 10*3/uL (ref 150–400)
RBC: 4.49 MIL/uL (ref 4.22–5.81)
RDW: 17.7 % — ABNORMAL HIGH (ref 11.5–15.5)
WBC: 8.3 10*3/uL (ref 4.0–10.5)

## 2015-02-26 LAB — CBC
HEMATOCRIT: 37.9 % — AB (ref 39.0–52.0)
HEMATOCRIT: 37.9 % — AB (ref 39.0–52.0)
HEMOGLOBIN: 12.6 g/dL — AB (ref 13.0–17.0)
Hemoglobin: 12.5 g/dL — ABNORMAL LOW (ref 13.0–17.0)
MCH: 27.2 pg (ref 26.0–34.0)
MCH: 27.4 pg (ref 26.0–34.0)
MCHC: 33 g/dL (ref 30.0–36.0)
MCHC: 33.2 g/dL (ref 30.0–36.0)
MCV: 82.6 fL (ref 78.0–100.0)
MCV: 82.4 fL (ref 78.0–100.0)
Platelets: 201 10*3/uL (ref 150–400)
Platelets: 202 10*3/uL (ref 150–400)
RBC: 4.59 MIL/uL (ref 4.22–5.81)
RBC: 4.6 MIL/uL (ref 4.22–5.81)
RDW: 17.7 % — AB (ref 11.5–15.5)
RDW: 17.6 % — AB (ref 11.5–15.5)
WBC: 6.9 10*3/uL (ref 4.0–10.5)
WBC: 7.9 10*3/uL (ref 4.0–10.5)

## 2015-02-26 LAB — GLUCOSE, CAPILLARY
GLUCOSE-CAPILLARY: 100 mg/dL — AB (ref 65–99)
GLUCOSE-CAPILLARY: 143 mg/dL — AB (ref 65–99)
Glucose-Capillary: 153 mg/dL — ABNORMAL HIGH (ref 65–99)
Glucose-Capillary: 105 mg/dL — ABNORMAL HIGH (ref 65–99)

## 2015-02-26 LAB — LIPID PANEL
CHOLESTEROL: 189 mg/dL (ref 0–200)
HDL: 65 mg/dL (ref >40)
LDL Cholesterol: 112 mg/dL — ABNORMAL HIGH (ref 0–99)
Total CHOL/HDL Ratio: 2.9 RATIO
Triglycerides: 61 mg/dL (ref <150)
VLDL: 12 mg/dL (ref 0–40)

## 2015-02-26 LAB — CREATININE, SERUM: Creatinine, Ser: 1.11 mg/dL (ref 0.61–1.24)

## 2015-02-26 LAB — T4, FREE: FREE T4: 0.85 ng/dL (ref 0.61–1.12)

## 2015-02-26 LAB — TROPONIN I

## 2015-02-26 LAB — MAGNESIUM: MAGNESIUM: 1.8 mg/dL (ref 1.7–2.4)

## 2015-02-26 LAB — HEPARIN LEVEL (UNFRACTIONATED): HEPARIN UNFRACTIONATED: 1.08 [IU]/mL — AB (ref 0.30–0.70)

## 2015-02-26 LAB — BRAIN NATRIURETIC PEPTIDE: B Natriuretic Peptide: 128.7 pg/mL — ABNORMAL HIGH (ref 0.0–100.0)

## 2015-02-26 SURGERY — LEFT HEART CATH AND CORONARY ANGIOGRAPHY
Anesthesia: LOCAL

## 2015-02-26 MED ORDER — HEPARIN SODIUM (PORCINE) 1000 UNIT/ML IJ SOLN
INTRAMUSCULAR | Status: AC
  Filled 2015-02-26: qty 1

## 2015-02-26 MED ORDER — SODIUM CHLORIDE 0.9% FLUSH: 3.0000 mL | INTRAVENOUS | Status: DC | PRN

## 2015-02-26 MED ORDER — FENTANYL CITRATE (PF) 100 MCG/2ML IJ SOLN
INTRAMUSCULAR | Status: AC
  Filled 2015-02-26: qty 2

## 2015-02-26 MED ORDER — SODIUM CHLORIDE 0.9% FLUSH: 3.0000 mL | Freq: Two times a day (BID) | INTRAVENOUS | Status: DC

## 2015-02-26 MED ORDER — OFF THE BEAT BOOK
Freq: Once | Status: AC
  Administered 2015-03-01: 08:00:00
  Filled 2015-02-26: qty 1

## 2015-02-26 MED ORDER — ATORVASTATIN CALCIUM 40 MG PO TABS
40.0000 mg | ORAL_TABLET | Freq: Every day | ORAL | Status: DC
  Administered 2015-02-26 – 2015-03-01 (×4): 40 mg via ORAL
  Filled 2015-02-26 (×4): qty 1

## 2015-02-26 MED ORDER — ATORVASTATIN CALCIUM 80 MG PO TABS
80.0000 mg | ORAL_TABLET | Freq: Every day | ORAL | Status: DC
  Administered 2015-02-26: 80 mg via ORAL
  Filled 2015-02-26: qty 1

## 2015-02-26 MED ORDER — HEPARIN SODIUM (PORCINE) 1000 UNIT/ML IJ SOLN
INTRAMUSCULAR | Status: DC | PRN
  Administered 2015-02-26: 4500 [IU] via INTRAVENOUS

## 2015-02-26 MED ORDER — VERAPAMIL HCL 2.5 MG/ML IV SOLN
INTRAVENOUS | Status: AC
  Filled 2015-02-26: qty 2

## 2015-02-26 MED ORDER — MIDAZOLAM HCL 2 MG/2ML IJ SOLN
INTRAMUSCULAR | Status: DC | PRN
Start: 2015-02-26 — End: 2015-02-26
  Administered 2015-02-26: 1 mg via INTRAVENOUS

## 2015-02-26 MED ORDER — OXYCODONE-ACETAMINOPHEN 5-325 MG PO TABS: 1.0000 | ORAL_TABLET | ORAL | Status: DC | PRN

## 2015-02-26 MED ORDER — MIDAZOLAM HCL 2 MG/2ML IJ SOLN
INTRAMUSCULAR | Status: AC
  Filled 2015-02-26: qty 2

## 2015-02-26 MED ORDER — SODIUM CHLORIDE 0.9 % IV SOLN: 250.0000 mL | INTRAVENOUS | Status: DC | PRN

## 2015-02-26 MED ORDER — ACETAMINOPHEN 325 MG PO TABS: 650.0000 mg | ORAL_TABLET | ORAL | Status: DC | PRN

## 2015-02-26 MED ORDER — METOPROLOL TARTRATE 12.5 MG HALF TABLET
12.5000 mg | ORAL_TABLET | Freq: Two times a day (BID) | ORAL | Status: DC
  Administered 2015-02-26 – 2015-02-27 (×3): 12.5 mg via ORAL
  Filled 2015-02-26 (×3): qty 1

## 2015-02-26 MED ORDER — HEPARIN (PORCINE) IN NACL 2-0.9 UNIT/ML-% IJ SOLN
INTRAMUSCULAR | Status: AC
  Filled 2015-02-26: qty 1000

## 2015-02-26 MED ORDER — HEPARIN SODIUM (PORCINE) 5000 UNIT/ML IJ SOLN
5000.0000 [IU] | Freq: Three times a day (TID) | INTRAMUSCULAR | Status: DC
  Administered 2015-02-26 – 2015-02-28 (×6): 5000 [IU] via SUBCUTANEOUS
  Filled 2015-02-26 (×6): qty 1

## 2015-02-26 MED ORDER — SODIUM CHLORIDE 0.9 % IV SOLN
INTRAVENOUS | Status: DC
  Administered 2015-02-26: 11:00:00 via INTRAVENOUS

## 2015-02-26 MED ORDER — POTASSIUM CHLORIDE CRYS ER 20 MEQ PO TBCR
40.0000 meq | EXTENDED_RELEASE_TABLET | Freq: Once | ORAL | Status: AC
  Administered 2015-02-26: 40 meq via ORAL
  Filled 2015-02-26: qty 2

## 2015-02-26 MED ORDER — LIDOCAINE HCL (PF) 1 % IJ SOLN
INTRAMUSCULAR | Status: AC
  Filled 2015-02-26: qty 30

## 2015-02-26 MED ORDER — SODIUM CHLORIDE 0.9 % WEIGHT BASED INFUSION: 1.0000 mL/kg/h | INTRAVENOUS | Status: AC

## 2015-02-26 MED ORDER — ONDANSETRON HCL 4 MG/2ML IJ SOLN: 4.0000 mg | Freq: Four times a day (QID) | INTRAMUSCULAR | Status: DC | PRN

## 2015-02-26 MED ORDER — IOHEXOL 350 MG/ML SOLN
INTRAVENOUS | Status: DC | PRN
  Administered 2015-02-26: 100 mL via INTRA_ARTERIAL

## 2015-02-26 MED ORDER — ASPIRIN 81 MG PO CHEW: 81.0000 mg | CHEWABLE_TABLET | ORAL | Status: DC

## 2015-02-26 MED ORDER — HEPARIN (PORCINE) IN NACL 100-0.45 UNIT/ML-% IJ SOLN
1000.0000 [IU]/h | INTRAMUSCULAR | Status: DC
  Administered 2015-02-26: 1000 [IU]/h via INTRAVENOUS

## 2015-02-26 MED ORDER — SODIUM CHLORIDE 0.9% FLUSH
3.0000 mL | Freq: Two times a day (BID) | INTRAVENOUS | Status: DC
  Administered 2015-02-27 – 2015-03-01 (×3): 3 mL via INTRAVENOUS

## 2015-02-26 MED ORDER — LIDOCAINE HCL (PF) 1 % IJ SOLN
INTRAMUSCULAR | Status: DC | PRN
  Administered 2015-02-26: 2 mL via INTRADERMAL

## 2015-02-26 MED ORDER — VERAPAMIL HCL 2.5 MG/ML IV SOLN
INTRA_ARTERIAL | Status: DC | PRN
  Administered 2015-02-26: 10 mL via INTRA_ARTERIAL

## 2015-02-26 MED ORDER — FENTANYL CITRATE (PF) 100 MCG/2ML IJ SOLN
INTRAMUSCULAR | Status: DC | PRN
  Administered 2015-02-26: 50 ug via INTRAVENOUS

## 2015-02-26 SURGICAL SUPPLY — 11 items
CATH INFINITI 5 FR JL3.5 (CATHETERS) ×2 IMPLANT
CATH INFINITI JR4 5F (CATHETERS) ×2 IMPLANT
CATH LAUNCHER 5F RADR (CATHETERS) IMPLANT
CATHETER LAUNCHER 5F RADR (CATHETERS) ×2
DEVICE RAD COMP TR BAND LRG (VASCULAR PRODUCTS) ×2 IMPLANT
GLIDESHEATH SLEND A-KIT 6F 22G (SHEATH) ×2 IMPLANT
KIT HEART LEFT (KITS) ×2 IMPLANT
PACK CARDIAC CATHETERIZATION (CUSTOM PROCEDURE TRAY) ×2 IMPLANT
TRANSDUCER W/STOPCOCK (MISCELLANEOUS) ×2 IMPLANT
TUBING CIL FLEX 10 FLL-RA (TUBING) ×2 IMPLANT
WIRE SAFE-T 1.5MM-J .035X260CM (WIRE) ×2 IMPLANT

## 2015-02-26 NOTE — Progress Notes (Signed)
UR Completed Petrita Blunck Graves-Bigelow, RN,BSN [phone]  

## 2015-02-26 NOTE — Consult Note (Addendum)
Cardiologist:  New Reason for Consult: Chest pain, tachyarrhythmia Referring Physician: [name] is an 82 y.o. male.  HPI:   The patient is an 82yo male with a history of DM, HTN, allergies, GERD, prostate cancer, OSA-CPAP at home, heart MM.  He reports 3 days of chest tightness and intermittent rapid heart rate associated with dyspnea, pain in the right side of his neck and clammy feeling.  He is very active and is always outside working in his yard or someone else's.  He denies nausea, vomiting, fever, orthopnea, dizziness, PND, cough, congestion, abdominal pain, hematochezia, melena, lower extremity edema, claudication.  No family history of CAD.  His EKG from [place] looks like Sinus tach with 1st deg AVB.  ST depression in V2-4.   Past Medical History  Diagnosis Date  . Diabetes mellitus   . Hypertension   . Seasonal allergies   . GERD (gastroesophageal reflux disease)   . Prostate cancer ([place]) 2011  . Heart murmur   . Sleep apnea     Past Surgical History  Procedure Laterality Date  . Inguinal hernia repair  1992    left  . Transurethral resection of prostate    . Flexible sigmoidoscopy N/A 03/23/2013    Procedure: FLEXIBLE SIGMOIDOSCOPY;  Surgeon: Beryle Beams, MD;  Location: [facility];  Service: Endoscopy;  Laterality: N/A;  . Nasal sinus surgery  02/01/15    Family History  Problem Relation Age of Onset  . Colon cancer Brother 40  . Stomach cancer Neg Hx   . Colon cancer Brother       Social History:  reports that he has never smoked. He has never used smokeless tobacco. He reports that he does not drink alcohol or use illicit drugs.  Allergies: No Known Allergies  Medications:  Scheduled Meds: . aspirin EC  81 mg Oral Daily  . calcium-vitamin D   Oral BID WC  . diltiazem  30 mg Oral 4 times per day  . insulin aspart  0-9 Units Subcutaneous TID WC  . off the beat book   Does not apply Once  . olopatadine  1 drop Both Eyes BID  .  omega-3 acid ethyl esters  1 g Oral Daily  . pantoprazole  40 mg Oral Daily  . vitamin E  800 Units Oral Daily   Continuous Infusions: . heparin 1,250 Units/hr (02/25/15 2351)   PRN Meds:.acetaminophen, ondansetron (ZOFRAN) IV   Results for orders placed or performed during the hospital encounter of 02/25/15 (from the past 48 hour(s))  Brain natriuretic peptide     Status: Abnormal   Collection Time: 02/25/15 10:19 PM  Result Value Ref Range   B Natriuretic Peptide 128.7 (H) 0.0 - 100.0 pg/mL  Troponin I     Status: None   Collection Time: 02/25/15 10:19 PM  Result Value Ref Range   Troponin I <0.03 <0.031 ng/mL    Comment:        NO INDICATION OF MYOCARDIAL INJURY.   TSH     Status: None   Collection Time: 02/25/15 10:19 PM  Result Value Ref Range   TSH 2.125 0.350 - 4.500 uIU/mL  T4, free     Status: None   Collection Time: 02/25/15 10:19 PM  Result Value Ref Range   Free T4 0.85 0.61 - 1.12 ng/dL  Magnesium     Status: None   Collection Time: 02/25/15 10:19 PM  Result Value Ref Range   Magnesium 1.8 1.7 -  2.4 mg/dL  Protime-INR     Status: None   Collection Time: 02/25/15 10:19 PM  Result Value Ref Range   Prothrombin Time 14.1 11.6 - 15.2 seconds   INR 1.07 0.00 - 1.49  APTT     Status: None   Collection Time: 02/25/15 10:19 PM  Result Value Ref Range   aPTT 32 24 - 37 seconds  Glucose, capillary     Status: Abnormal   Collection Time: 02/25/15 11:47 PM  Result Value Ref Range   Glucose-Capillary 167 (H) 65 - 99 mg/dL  Troponin I     Status: None   Collection Time: 02/26/15  4:52 AM  Result Value Ref Range   Troponin I <0.03 <0.031 ng/mL    Comment:        NO INDICATION OF MYOCARDIAL INJURY.   Comprehensive metabolic panel     Status: Abnormal   Collection Time: 02/26/15  4:52 AM  Result Value Ref Range   Sodium 141 135 - 145 mmol/L   Potassium 3.3 (L) 3.5 - 5.1 mmol/L   Chloride 106 101 - 111 mmol/L   CO2 25 22 - 32 mmol/L   Glucose, Bld 129 (H) 65  - 99 mg/dL   BUN 12 6 - 20 mg/dL   Creatinine, Ser 1.29 (H) 0.61 - 1.24 mg/dL   Calcium 9.0 8.9 - 10.3 mg/dL   Total Protein 5.8 (L) 6.5 - 8.1 g/dL   Albumin 2.9 (L) 3.5 - 5.0 g/dL   AST 18 15 - 41 U/L   ALT 11 (L) 17 - 63 U/L   Alkaline Phosphatase 51 38 - 126 U/L   Total Bilirubin 1.1 0.3 - 1.2 mg/dL   GFR calc non Af Amer 51 (L) >60 mL/min   GFR calc Af Amer 59 (L) >60 mL/min    Comment: (NOTE) The eGFR has been calculated using the CKD EPI equation. This calculation has not been validated in all clinical situations. eGFR's persistently <60 mL/min signify possible Chronic Kidney Disease.    Anion gap 10 5 - 15  CBC WITH DIFFERENTIAL     Status: Abnormal   Collection Time: 02/26/15  4:52 AM  Result Value Ref Range   WBC 8.3 4.0 - 10.5 K/uL   RBC 4.49 4.22 - 5.81 MIL/uL   Hemoglobin 12.4 (L) 13.0 - 17.0 g/dL   HCT 36.9 (L) 39.0 - 52.0 %   MCV 82.2 78.0 - 100.0 fL   MCH 27.6 26.0 - 34.0 pg   MCHC 33.6 30.0 - 36.0 g/dL   RDW 17.7 (H) 11.5 - 15.5 %   Platelets 195 150 - 400 K/uL   Neutrophils Relative % 52 %   Neutro Abs 4.3 1.7 - 7.7 K/uL   Lymphocytes Relative 36 %   Lymphs Abs 3.0 0.7 - 4.0 K/uL   Monocytes Relative 9 %   Monocytes Absolute 0.7 0.1 - 1.0 K/uL   Eosinophils Relative 3 %   Eosinophils Absolute 0.3 0.0 - 0.7 K/uL   Basophils Relative 0 %   Basophils Absolute 0.0 0.0 - 0.1 K/uL  Heparin level (unfractionated)     Status: Abnormal   Collection Time: 02/26/15  7:26 AM  Result Value Ref Range   Heparin Unfractionated 1.08 (H) 0.30 - 0.70 IU/mL    Comment:        IF HEPARIN RESULTS ARE BELOW EXPECTED VALUES, AND PATIENT DOSAGE HAS BEEN CONFIRMED, SUGGEST FOLLOW UP TESTING OF ANTITHROMBIN III LEVELS.   Glucose, capillary  Status: Abnormal   Collection Time: 02/26/15  7:33 AM  Result Value Ref Range   Glucose-Capillary 153 (H) 65 - 99 mg/dL    No results found.  Review of Systems  Constitutional: Positive for diaphoresis. Negative for fever.    HENT: Negative for congestion and sore throat.   Respiratory: Positive for shortness of breath. Negative for cough and wheezing.   Cardiovascular: Positive for chest pain and palpitations. Negative for orthopnea, leg swelling and PND.  Gastrointestinal: Negative for nausea, vomiting, abdominal pain, blood in stool and melena.  Musculoskeletal: Positive for neck pain (right sided).  Neurological: Negative for dizziness.  All other systems reviewed and are negative.  Blood pressure 107/66, pulse 65, temperature 98.3 F (36.8 C), temperature source Oral, resp. rate 17, height '5\' 9"'  (1.753 m), weight 198 lb 8 oz (90.039 kg), SpO2 97 %. Physical Exam  Nursing note and vitals reviewed. Constitutional: He is oriented to person, place, and time. He appears well-developed and well-nourished.  HENT:  Head: Normocephalic and atraumatic.  Eyes: EOM are normal. Pupils are equal, round, and reactive to light.  Neck: Normal range of motion. Neck supple. No JVD present.  Cardiovascular: Normal rate, regular rhythm, S1 normal and S2 normal.  Exam reveals no gallop and no friction rub.   Murmur heard.  Systolic murmur is present with a grade of 1/6  Pulses:      Radial pulses are 2+ on the right side, and 2+ on the left side.       Dorsalis pedis pulses are 2+ on the right side, and 2+ on the left side.  Respiratory: Effort normal and breath sounds normal. He has no wheezes. He has no rales. He exhibits tenderness (Left of sternum ).  GI: Soft. Bowel sounds are normal. He exhibits no distension. There is no tenderness.  Musculoskeletal: He exhibits no edema.  Lymphadenopathy:    He has no cervical adenopathy.  Neurological: He is alert and oriented to person, place, and time. He exhibits normal muscle tone.  Skin: Skin is warm and dry.  Psychiatric: He has a normal mood and affect.    Assessment/Plan: Principal Problem:   Tachyarrhythmia Active Problems:   Type II diabetes mellitus ([place])    Essential hypertension, benign   Obstructive sleep apnea   Chest pain   Hypokalemia  82 yo male with no prior cardiac history a history of DM, HTN, allergies, GERD, prostate cancer, OSA-CPAP at home, heart MM.  He reports 3 days of chest tightness and intermittent rapid heart rate associated with dyspnea, pain in the right side of his neck and clammy feeling.  He is very active and is always outside working in his yard or someone else's.  He denies nausea, vomiting, fever, orthopnea, dizziness, PND, cough, congestion, abdominal pain, hematochezia, melena, lower extremity edema, claudication.  No family history of CAD.  His [facility] looks like Sinus tach with 1st deg AVB.  ST depression in V2-4.  Troponin was negative.  He was treated with diltiazem and is now on 39m PO Q6hour.  May need to change depending on echo results.    NSR and sinus brady on tele over night.  On IV heparin  Echocardiogram was just finished.  Will be read shortly.  I recommend coronary angiography given CP with ST depression in V 2-4.  Adding low dose of metoprolol, lipitor 857m    Replace potassium.  Robert FullerPACaswell Beach/03/2015, 8:26 AM    Patient seen and examined.  Agree with assessment and plan.  Robert Mcknight is a very pleasant 82 year old African-American male who has a history of type 2 diabetes mellitus, hypertension, obstructive sleep apnea on CPAP therapy, GERD, and remote history of prostate CA.  The patient has remained very active and still works as a Development worker, international aid and keeps himself very busy.  He states that over 20 years ago.  He had a heart catheterization at [facility] and was told that his coronary arteries were normal.  The past 3 days he has had symptoms of recurrent chest pain associated with palpitations and shortness of breath.  His pain is described as a tightness.  He was evaluated at [facility].  Initial ECG reported showed  sinus tachycardia in the emergency room, he apparently  developed a heart rate of ~150 bpm and was given 10 mg IV Cardizem with return to sinus rhythm.  The EKG independently reviewed by me.  Suggest possible long RP tachycardia versus a junctional tachycardia at a rate of 145 bpm with  1-2 mm of horizontal to downsloping ST depression in leads V2 and V3, being worse in lead V3.  He was transferred for her to [facility] hospital.  Subsequent ECG here independently reviewed by me shows sinus rhythm at 60 bpm with first-degree block and a PR interval at 218 ms and resolution of his previous precordial ST segment depression.  An echo Doppler study was ordered and just completed but results are still not available.  Presently on exam, he is chest pain-free.  Blood pressure 107/66.  Pulse in the 60s.  H&T was unremarkable.  Malinpati scale is grade 3.  He did not have carotid bruits.  There was no JVD.  His lungs were clear without wheezes.  Rhythm was regular with 0-2/7 systolic murmur.  There is no S3 gallop.  There are no rubs thrills or heaves.  Abdomen was soft and nontender without hepatosplenomegaly.  Pulses were 2+.  There was no clubbing, cyanosis or edema.  Neurologic exam was nonfocal.  Initial troponins are negative.  His creatinine is 1.29 with a GFR of 59, placing him in stage III chronic kidney disease.  BNP is 128.7.  He has hyperlipidemia with an LDL of 112.  Hemoglobin and hematocrit are stable.  He has been on IV heparin and has been started on oral Cardizem.  Thyroid function studies are normal.  I long discussion with the patient, his wife and daughter.  With his worrisome symptoms of chest tightness over the past several days and ECG changes suggestive of ischemia in the setting of his tachycardia.  I have recommended definitive cardiac catheterization.The risks and benefits of a cardiac catheterization including, but not limited to, death, stroke, MI, kidney damage and bleeding were discussed with the patient who indicates understanding and agrees to proceed.   The patient has eaten a small breakfast early this morning.  Plan for cardiac catheterization later today.  Robert Sine, MD, [facility] 02/26/2015 10:50 AM

## 2015-02-26 NOTE — Progress Notes (Signed)
ANTICOAGULATION CONSULT NOTE - Consult  Pharmacy Consult for heparin Indication: atrial fibrillation  No Known Allergies  Patient Measurements: Height: 5\' 9"  (175.3 cm) Weight: 198 lb 8 oz (90.039 kg) IBW/kg (Calculated) : 70.7 Heparin Dosing Weight: 88.8 kg  Vital Signs: Temp: 98.3 F (36.8 C) (02/03 0346) Temp Source: Oral (02/03 0346) BP: 107/66 mmHg (02/03 0346) Pulse Rate: 65 (02/03 0346)  Labs:  Recent Labs  02/25/15 2219 02/26/15 0452 02/26/15 0726  HGB  --  12.4*  --   HCT  --  36.9*  --   PLT  --  195  --   APTT 32  --   --   LABPROT 14.1  --   --   INR 1.07  --   --   HEPARINUNFRC  --   --  1.08*  CREATININE  --  1.29*  --   TROPONINI <0.03 <0.03  --     Estimated Creatinine Clearance: 50.6 mL/min (by C-G formula based on Cr of 1.29).  Assessment: 82 yo M transferred from [facility] in [place]. He did not arrive with heparin drip and RN did not get report that he was on heparin at outside hospital. Pharmacy consulted to dose heparin for afib.  Wt 89.7 kg. Pt not on anticoagulants PTA.  First heparin level came back supratherapeutic at 1.08.  Goal of Therapy:  Heparin level 0.3-0.7 units/ml Monitor platelets by anticoagulation protocol: Yes   Plan:  Stop heparin drip x 1 hour and restart heparin infusion at 1000 units/hr Check anti-Xa level in 8 hours and daily while on heparin Continue to monitor H&H and platelets  Thank you for allowing [place] to participate in this patients care. Jens Som, PharmD Pager: [phone]  02/26/2015 8:37 AM

## 2015-02-26 NOTE — Research (Signed)
[place] Informed Consent   Subject Name: Robert Mcknight  Subject met inclusion and exclusion criteria.  The informed consent form, study requirements and expectations were reviewed with the subject and questions and concerns were addressed prior to the signing of the consent form.  The subject verbalized understanding of the trial requirements.  The subject agreed to participate in the [place] trial and signed the informed consent.  The informed consent was obtained prior to performance of any protocol-specific procedures for the subject.  A copy of the signed informed consent was given to the subject and a copy was placed in the subject's medical record.  Marlana Salvage 02/26/2015, 13:15 PM

## 2015-02-26 NOTE — H&P (View-Only) (Signed)
Cardiologist:  New Reason for Consult: Chest pain, tachyarrhythmia Referring Physician: Daryll Spisak is an 82 y.o. male.  HPI:   The patient is an 82yo male with a history of DM, HTN, allergies, GERD, prostate cancer, OSA-CPAP at home, heart MM.  He reports 3 days of chest tightness and intermittent rapid heart rate associated with dyspnea, pain in the right side of his neck and clammy feeling.  He is very active and is always outside working in his yard or someone else's.  He denies nausea, vomiting, fever, orthopnea, dizziness, PND, cough, congestion, abdominal pain, hematochezia, melena, lower extremity edema, claudication.  No family history of CAD.  His EKG from [place] looks like Sinus tach with 1st deg AVB.  ST depression in V2-4.   Past Medical History  Diagnosis Date  . Diabetes mellitus   . Hypertension   . Seasonal allergies   . GERD (gastroesophageal reflux disease)   . Prostate cancer ([place]) 2011  . Heart murmur   . Sleep apnea     Past Surgical History  Procedure Laterality Date  . Inguinal hernia repair  1992    left  . Transurethral resection of prostate    . Flexible sigmoidoscopy N/A 03/23/2013    Procedure: FLEXIBLE SIGMOIDOSCOPY;  Surgeon: Beryle Beams, MD;  Location: [facility];  Service: Endoscopy;  Laterality: N/A;  . Nasal sinus surgery  02/01/15    Family History  Problem Relation Age of Onset  . Colon cancer Brother 67  . Stomach cancer Neg Hx   . Colon cancer Brother       Social History:  reports that he has never smoked. He has never used smokeless tobacco. He reports that he does not drink alcohol or use illicit drugs.  Allergies: No Known Allergies  Medications:  Scheduled Meds: . aspirin EC  81 mg Oral Daily  . calcium-vitamin D   Oral BID WC  . diltiazem  30 mg Oral 4 times per day  . insulin aspart  0-9 Units Subcutaneous TID WC  . off the beat book   Does not apply Once  . olopatadine  1 drop Both Eyes BID  .  omega-3 acid ethyl esters  1 g Oral Daily  . pantoprazole  40 mg Oral Daily  . vitamin E  800 Units Oral Daily   Continuous Infusions: . heparin 1,250 Units/hr (02/25/15 2351)   PRN Meds:.acetaminophen, ondansetron (ZOFRAN) IV   Results for orders placed or performed during the hospital encounter of 02/25/15 (from the past 48 hour(s))  Brain natriuretic peptide     Status: Abnormal   Collection Time: 02/25/15 10:19 PM  Result Value Ref Range   B Natriuretic Peptide 128.7 (H) 0.0 - 100.0 pg/mL  Troponin I     Status: None   Collection Time: 02/25/15 10:19 PM  Result Value Ref Range   Troponin I <0.03 <0.031 ng/mL    Comment:        NO INDICATION OF MYOCARDIAL INJURY.   TSH     Status: None   Collection Time: 02/25/15 10:19 PM  Result Value Ref Range   TSH 2.125 0.350 - 4.500 uIU/mL  T4, free     Status: None   Collection Time: 02/25/15 10:19 PM  Result Value Ref Range   Free T4 0.85 0.61 - 1.12 ng/dL  Magnesium     Status: None   Collection Time: 02/25/15 10:19 PM  Result Value Ref Range   Magnesium 1.8 1.7 -  2.4 mg/dL  Protime-INR     Status: None   Collection Time: 02/25/15 10:19 PM  Result Value Ref Range   Prothrombin Time 14.1 11.6 - 15.2 seconds   INR 1.07 0.00 - 1.49  APTT     Status: None   Collection Time: 02/25/15 10:19 PM  Result Value Ref Range   aPTT 32 24 - 37 seconds  Glucose, capillary     Status: Abnormal   Collection Time: 02/25/15 11:47 PM  Result Value Ref Range   Glucose-Capillary 167 (H) 65 - 99 mg/dL  Troponin I     Status: None   Collection Time: 02/26/15  4:52 AM  Result Value Ref Range   Troponin I <0.03 <0.031 ng/mL    Comment:        NO INDICATION OF MYOCARDIAL INJURY.   Comprehensive metabolic panel     Status: Abnormal   Collection Time: 02/26/15  4:52 AM  Result Value Ref Range   Sodium 141 135 - 145 mmol/L   Potassium 3.3 (L) 3.5 - 5.1 mmol/L   Chloride 106 101 - 111 mmol/L   CO2 25 22 - 32 mmol/L   Glucose, Bld 129 (H) 65  - 99 mg/dL   BUN 12 6 - 20 mg/dL   Creatinine, Ser 1.29 (H) 0.61 - 1.24 mg/dL   Calcium 9.0 8.9 - 10.3 mg/dL   Total Protein 5.8 (L) 6.5 - 8.1 g/dL   Albumin 2.9 (L) 3.5 - 5.0 g/dL   AST 18 15 - 41 U/L   ALT 11 (L) 17 - 63 U/L   Alkaline Phosphatase 51 38 - 126 U/L   Total Bilirubin 1.1 0.3 - 1.2 mg/dL   GFR calc non Af Amer 51 (L) >60 mL/min   GFR calc Af Amer 59 (L) >60 mL/min    Comment: (NOTE) The eGFR has been calculated using the CKD EPI equation. This calculation has not been validated in all clinical situations. eGFR's persistently <60 mL/min signify possible Chronic Kidney Disease.    Anion gap 10 5 - 15  CBC WITH DIFFERENTIAL     Status: Abnormal   Collection Time: 02/26/15  4:52 AM  Result Value Ref Range   WBC 8.3 4.0 - 10.5 K/uL   RBC 4.49 4.22 - 5.81 MIL/uL   Hemoglobin 12.4 (L) 13.0 - 17.0 g/dL   HCT 36.9 (L) 39.0 - 52.0 %   MCV 82.2 78.0 - 100.0 fL   MCH 27.6 26.0 - 34.0 pg   MCHC 33.6 30.0 - 36.0 g/dL   RDW 17.7 (H) 11.5 - 15.5 %   Platelets 195 150 - 400 K/uL   Neutrophils Relative % 52 %   Neutro Abs 4.3 1.7 - 7.7 K/uL   Lymphocytes Relative 36 %   Lymphs Abs 3.0 0.7 - 4.0 K/uL   Monocytes Relative 9 %   Monocytes Absolute 0.7 0.1 - 1.0 K/uL   Eosinophils Relative 3 %   Eosinophils Absolute 0.3 0.0 - 0.7 K/uL   Basophils Relative 0 %   Basophils Absolute 0.0 0.0 - 0.1 K/uL  Heparin level (unfractionated)     Status: Abnormal   Collection Time: 02/26/15  7:26 AM  Result Value Ref Range   Heparin Unfractionated 1.08 (H) 0.30 - 0.70 IU/mL    Comment:        IF HEPARIN RESULTS ARE BELOW EXPECTED VALUES, AND PATIENT DOSAGE HAS BEEN CONFIRMED, SUGGEST FOLLOW UP TESTING OF ANTITHROMBIN III LEVELS.   Glucose, capillary  Status: Abnormal   Collection Time: 02/26/15  7:33 AM  Result Value Ref Range   Glucose-Capillary 153 (H) 65 - 99 mg/dL    No results found.  Review of Systems  Constitutional: Positive for diaphoresis. Negative for fever.    HENT: Negative for congestion and sore throat.   Respiratory: Positive for shortness of breath. Negative for cough and wheezing.   Cardiovascular: Positive for chest pain and palpitations. Negative for orthopnea, leg swelling and PND.  Gastrointestinal: Negative for nausea, vomiting, abdominal pain, blood in stool and melena.  Musculoskeletal: Positive for neck pain (right sided).  Neurological: Negative for dizziness.  All other systems reviewed and are negative.  Blood pressure 107/66, pulse 65, temperature 98.3 F (36.8 C), temperature source Oral, resp. rate 17, height '5\' 9"'  (1.753 m), weight 198 lb 8 oz (90.039 kg), SpO2 97 %. Physical Exam  Nursing note and vitals reviewed. Constitutional: He is oriented to person, place, and time. He appears well-developed and well-nourished.  HENT:  Head: Normocephalic and atraumatic.  Eyes: EOM are normal. Pupils are equal, round, and reactive to light.  Neck: Normal range of motion. Neck supple. No JVD present.  Cardiovascular: Normal rate, regular rhythm, S1 normal and S2 normal.  Exam reveals no gallop and no friction rub.   Murmur heard.  Systolic murmur is present with a grade of 1/6  Pulses:      Radial pulses are 2+ on the right side, and 2+ on the left side.       Dorsalis pedis pulses are 2+ on the right side, and 2+ on the left side.  Respiratory: Effort normal and breath sounds normal. He has no wheezes. He has no rales. He exhibits tenderness (Left of sternum ).  GI: Soft. Bowel sounds are normal. He exhibits no distension. There is no tenderness.  Musculoskeletal: He exhibits no edema.  Lymphadenopathy:    He has no cervical adenopathy.  Neurological: He is alert and oriented to person, place, and time. He exhibits normal muscle tone.  Skin: Skin is warm and dry.  Psychiatric: He has a normal mood and affect.    Assessment/Plan: Principal Problem:   Tachyarrhythmia Active Problems:   Type II diabetes mellitus ([place])    Essential hypertension, benign   Obstructive sleep apnea   Chest pain   Hypokalemia  82 yo male with no prior cardiac history a history of DM, HTN, allergies, GERD, prostate cancer, OSA-CPAP at home, heart MM.  He reports 3 days of chest tightness and intermittent rapid heart rate associated with dyspnea, pain in the right side of his neck and clammy feeling.  He is very active and is always outside working in his yard or someone else's.  He denies nausea, vomiting, fever, orthopnea, dizziness, PND, cough, congestion, abdominal pain, hematochezia, melena, lower extremity edema, claudication.  No family history of CAD.  His EKG from [facility] looks like Sinus tach with 1st deg AVB.  ST depression in V2-4.  Troponin was negative.  He was treated with diltiazem and is now on 14m PO Q6hour.  May need to change depending on echo results.    NSR and sinus brady on tele over night.  On IV heparin  Echocardiogram was just finished.  Will be read shortly.  I recommend coronary angiography given CP with ST depression in V 2-4.  Adding low dose of metoprolol, lipitor 858m    Replace potassium.  HATarri FullerPAShady Grove/03/2015, 8:26 AM    Patient seen and examined.  Agree with assessment and plan.  Mr. Domenique also is a very pleasant 82 year old African-American male who has a history of type 2 diabetes mellitus, hypertension, obstructive sleep apnea on CPAP therapy, GERD, and remote history of prostate CA.  The patient has remained very active and still works as a Development worker, international aid and keeps himself very busy.  He states that over 20 years ago.  He had a heart catheterization at [facility] and was told that his coronary arteries were normal.  The past 3 days he has had symptoms of recurrent chest pain associated with palpitations and shortness of breath.  His pain is described as a tightness.  He was evaluated at [facility].  Initial ECG reported showed  sinus tachycardia in the emergency room, he apparently  developed a heart rate of ~150 bpm and was given 10 mg IV Cardizem with return to sinus rhythm.  The EKG independently reviewed by me.  Suggest possible long RP tachycardia versus a junctional tachycardia at a rate of 145 bpm with  1-2 mm of horizontal to downsloping ST depression in leads V2 and V3, being worse in lead V3.  He was transferred for her to [facility] hospital.  Subsequent ECG here independently reviewed by me shows sinus rhythm at 60 bpm with first-degree block and a PR interval at 218 ms and resolution of his previous precordial ST segment depression.  An echo Doppler study was ordered and just completed but results are still not available.  Presently on exam, he is chest pain-free.  Blood pressure 107/66.  Pulse in the 60s.  H&T was unremarkable.  Mental potty scale is grade 3.  He did not have carotid bruits.  There was no JVD.  His lungs were clear without wheezes.  Rhythm was regular with 8-8/7 systolic murmur.  There is no S3 gallop.  There are no rubs thrills or heaves.  Abdomen was soft and nontender without hepatosplenomegaly.  Pulses were 2+.  There was no clubbing, cyanosis or edema.  Neurologic exam was nonfocal.  Initial troponins are negative.  His creatinine is 1.29 with a GFR of 59, placing him in stage III chronic kidney disease.  BNP is 128.7.  He has hyperlipidemia with an LDL of 112.  Hemoglobin and hematocrit are stable.  He has been on IV heparin and has been started on oral Cardizem.  Thyroid function studies are normal.  I long discussion with the patient, his wife and daughter.  With his worrisome symptoms of chest tightness over the past several days and ECG changes suggestive of ischemia in the setting of his tachycardia.  I have recommended definitive cardiac catheterization.The risks and benefits of a cardiac catheterization including, but not limited to, death, stroke, MI, kidney damage and bleeding were discussed with the patient who indicates understanding and agrees to  proceed.  The patient has eaten a small breakfast early this morning.  Plan for cardiac catheterization later today.  Troy Sine, MD, [facility] 02/26/2015 10:50 AM

## 2015-02-26 NOTE — Progress Notes (Signed)
0600 diltiazem held per MD order d/t HR 58.  Will continue to monitor.  Jodell Cipro

## 2015-02-26 NOTE — Progress Notes (Signed)
Echocardiogram 2D Echocardiogram has been performed.  Robert Mcknight 02/26/2015, 9:11 AM

## 2015-02-26 NOTE — Interval H&P Note (Signed)
Cath Lab Visit (complete for each Cath Lab visit)  Clinical Evaluation Leading to the Procedure:   ACS: Yes.    Non-ACS:    Anginal Classification: CCS III  Anti-ischemic medical therapy: Maximal Therapy (2 or more classes of medications)  Non-Invasive Test Results: No non-invasive testing performed  Prior CABG: No previous CABG      History and Physical Interval Note:  02/26/2015 2:15 PM  Robert Mcknight  has presented today for surgery, with the diagnosis of CP/ tachycardia  The various methods of treatment have been discussed with the patient and family. After consideration of risks, benefits and other options for treatment, the patient has consented to  Procedure(s): Left Heart Cath and Coronary Angiography (N/A) as a surgical intervention .  The patient's history has been reviewed, patient examined, no change in status, stable for surgery.  I have reviewed the patient's chart and labs.  Questions were answered to the patient's satisfaction.     Sinclair Grooms

## 2015-02-26 NOTE — Progress Notes (Signed)
Pt. Decided not to wear cpap. Pt. Did not like the noise and comfort of the machine.

## 2015-02-26 NOTE — Progress Notes (Signed)
[facility] PROGRESS NOTE  STONEY REZA [id number] DOB: Aug 31, 1933 DOA: 02/25/2015 PCP: Donnajean Lopes, MD  Assessment/Plan: 1. ? Atrial flutter, at outside [place] presented to OSH with rapid junctional rhythm, apparently went in and out of rapid a flutter  - has been in NSR since he has been here, was given 10 mg IVP diltiazem x3 at outside hospital  - Stop Diltiazem, keep on tele, Cards to see - TSH/T4 normal, troponin negative  2. Chest tightness -resolved, troponin negative -cards to see  2. Type II DM  - Metformin on hold, SSI - A1c pending   3. Hypertension  - stable, benicar on hold  4. OSA - CPAP qHS   5. AKI - SCr 1.5, previously 1  - holding benicar - continue IVF  DVT ppx: Treatment-dose heparin   Code Status: Full Code Family Communication:wife at bedside Disposition Plan: Home pending    HPI/Subjective: Feels well, no complaints overnight  Objective: Filed Vitals:   02/26/15 0955 02/26/15 1119  BP: 141/68 153/82  Pulse: 67   Temp:    Resp:      Intake/Output Summary (Last 24 hours) at 02/26/15 1242 Last data filed at 02/26/15 0900  Gross per 24 hour  Intake 329.38 ml  Output    225 ml  Net 104.38 ml   Filed Weights   02/25/15 2123 02/26/15 0346  Weight: 89.676 kg (197 lb 11.2 oz) 90.039 kg (198 lb 8 oz)    Exam:   General:  AAOx3  Cardiovascular: S1S2/RRR  Respiratory: CTAB  Abdomen: soft, NT, BS present  Musculoskeletal: no edema c/c   Data Reviewed: Basic Metabolic Panel:  Recent Labs Lab 02/25/15 2219 02/26/15 0452  NA  --  141  K  --  3.3*  CL  --  106  CO2  --  25  GLUCOSE  --  129*  BUN  --  12  CREATININE  --  1.29*  CALCIUM  --  9.0  MG 1.8  --    Liver Function Tests:  Recent Labs Lab 02/26/15 0452  AST 18  ALT 11*  ALKPHOS 51  BILITOT 1.1  PROT 5.8*  ALBUMIN 2.9*   No results for input(s): LIPASE, AMYLASE in the last 168 hours. No results for input(s):  AMMONIA in the last 168 hours. CBC:  Recent Labs Lab 02/26/15 0452 02/26/15 0939  WBC 8.3 6.9  NEUTROABS 4.3  --   HGB 12.4* 12.5*  HCT 36.9* 37.9*  MCV 82.2 82.6  PLT 195 201   Cardiac Enzymes:  Recent Labs Lab 02/25/15 2219 02/26/15 0452 02/26/15 0939  TROPONINI <0.03 <0.03 <0.03   BNP (last 3 results)  Recent Labs  02/25/15 2219  BNP 128.7*    ProBNP (last 3 results) No results for input(s): PROBNP in the last 8760 hours.  CBG:  Recent Labs Lab 02/25/15 2347 02/26/15 0733 02/26/15 1218  GLUCAP 167* 153* 100*    No results found for this or any previous visit (from the past 240 hour(s)).   Studies: No results found.  Scheduled Meds: . [START ON 02/27/2015] aspirin  81 mg Oral Pre-Cath  . aspirin EC  81 mg Oral Daily  . atorvastatin  80 mg Oral q1800  . calcium-vitamin D   Oral BID WC  . diltiazem  30 mg Oral 4 times per day  . insulin aspart  0-9 Units Subcutaneous TID WC  . metoprolol tartrate  12.5 mg Oral BID  . off the beat book  Does not apply Once  . olopatadine  1 drop Both Eyes BID  . omega-3 acid ethyl esters  1 g Oral Daily  . pantoprazole  40 mg Oral Daily  . sodium chloride flush  3 mL Intravenous Q12H  . vitamin E  800 Units Oral Daily   Continuous Infusions: . sodium chloride 75 mL/hr at 02/26/15 1122  . heparin 1,000 Units/hr (02/26/15 0935)   Antibiotics Given (last 72 hours)    None      Principal Problem:   Tachyarrhythmia Active Problems:   Type II diabetes mellitus ([place])   Essential hypertension, benign   Obstructive sleep apnea   Chest pain   Hypokalemia    Time spent: 76min    Chioke,Carlicia Leavens  [facility] Pager [phone]. If 7PM-7AM, please contact night-coverage at [email or web address], password [facility] 02/26/2015, 12:42 PM  LOS: 1 day

## 2015-02-27 ENCOUNTER — Inpatient Hospital Stay (HOSPITAL_COMMUNITY): Payer: Medicare Other

## 2015-02-27 DIAGNOSIS — R079 Chest pain, unspecified: Secondary | ICD-10-CM

## 2015-02-27 DIAGNOSIS — I471 Supraventricular tachycardia, unspecified: Secondary | ICD-10-CM

## 2015-02-27 LAB — BASIC METABOLIC PANEL
Anion gap: 12 (ref 5–15)
BUN: 14 mg/dL (ref 6–20)
CALCIUM: 9.4 mg/dL (ref 8.9–10.3)
CHLORIDE: 104 mmol/L (ref 101–111)
CO2: 24 mmol/L (ref 22–32)
CREATININE: 1.31 mg/dL — AB (ref 0.61–1.24)
GFR, EST AFRICAN AMERICAN: 58 mL/min — AB (ref >60)
GFR, EST NON AFRICAN AMERICAN: 50 mL/min — AB (ref >60)
Glucose, Bld: 168 mg/dL — ABNORMAL HIGH (ref 65–99)
Potassium: 4 mmol/L (ref 3.5–5.1)
SODIUM: 140 mmol/L (ref 135–145)

## 2015-02-27 LAB — CBC
HCT: 35.9 % — ABNORMAL LOW (ref 39.0–52.0)
Hemoglobin: 11.8 g/dL — ABNORMAL LOW (ref 13.0–17.0)
MCH: 27.3 pg (ref 26.0–34.0)
MCHC: 32.9 g/dL (ref 30.0–36.0)
MCV: 83.1 fL (ref 78.0–100.0)
PLATELETS: 185 10*3/uL (ref 150–400)
RBC: 4.32 MIL/uL (ref 4.22–5.81)
RDW: 17.9 % — AB (ref 11.5–15.5)
WBC: 7.1 10*3/uL (ref 4.0–10.5)

## 2015-02-27 LAB — GLUCOSE, CAPILLARY
GLUCOSE-CAPILLARY: 136 mg/dL — AB (ref 65–99)
Glucose-Capillary: 107 mg/dL — ABNORMAL HIGH (ref 65–99)
Glucose-Capillary: 123 mg/dL — ABNORMAL HIGH (ref 65–99)
Glucose-Capillary: 118 mg/dL — ABNORMAL HIGH (ref 65–99)

## 2015-02-27 LAB — HEMOGLOBIN A1C
HEMOGLOBIN A1C: 6.6 % — AB (ref 4.8–5.6)
MEAN PLASMA GLUCOSE: 143 mg/dL

## 2015-02-27 LAB — D-DIMER, QUANTITATIVE (NOT AT ARMC): D DIMER QUANT: 0.52 ug{FEU}/mL — AB (ref 0.00–0.50)

## 2015-02-27 MED ORDER — SODIUM CHLORIDE 0.9 % IV SOLN
INTRAVENOUS | Status: AC
  Administered 2015-02-27: 19:00:00 via INTRAVENOUS
  Filled 2015-02-27: qty 1000

## 2015-02-27 MED ORDER — TECHNETIUM TO 99M ALBUMIN AGGREGATED
4.1000 | Freq: Once | INTRAVENOUS | Status: AC | PRN
  Administered 2015-02-27: 4 via INTRAVENOUS

## 2015-02-27 MED ORDER — METOPROLOL TARTRATE 25 MG PO TABS
37.5000 mg | ORAL_TABLET | Freq: Once | ORAL | Status: AC
  Administered 2015-02-27: 37.5 mg via ORAL
  Filled 2015-02-27: qty 1

## 2015-02-27 MED ORDER — METOPROLOL TARTRATE 1 MG/ML IV SOLN: 2.5000 mg | Freq: Once | INTRAVENOUS | Status: DC

## 2015-02-27 MED ORDER — METOPROLOL TARTRATE 50 MG PO TABS
50.0000 mg | ORAL_TABLET | Freq: Three times a day (TID) | ORAL | Status: DC
  Administered 2015-02-27 (×2): 50 mg via ORAL
  Filled 2015-02-27 (×2): qty 1

## 2015-02-27 MED ORDER — METOPROLOL TARTRATE 50 MG PO TABS: 50.0000 mg | ORAL_TABLET | Freq: Two times a day (BID) | ORAL | Status: DC

## 2015-02-27 MED ORDER — METOPROLOL TARTRATE 25 MG PO TABS: 25.0000 mg | ORAL_TABLET | Freq: Two times a day (BID) | ORAL | Status: DC

## 2015-02-27 MED ORDER — METOPROLOL TARTRATE 1 MG/ML IV SOLN: 5.0000 mg | Freq: Once | INTRAVENOUS | Status: DC

## 2015-02-27 MED ORDER — TECHNETIUM TC 99M DIETHYLENETRIAME-PENTAACETIC ACID: 30.1000 | Freq: Once | INTRAVENOUS | Status: DC | PRN

## 2015-02-27 NOTE — Progress Notes (Signed)
Patient's heart rate in 140' this AM.  Dr.  Carles Collet aware.  He ordered 5 mg IV lopressor.  This was held per Dr. Claiborne Billings because heart rate came down spontaneously to 80's-90's.   Dr. Claiborne Billings stated he was increasing his po lopressor from 12.5mg  to 50mg .

## 2015-02-27 NOTE — Progress Notes (Signed)
Subjective:  No chest pain; went back into SVT at 134 a short while ago when being seen by Dr. Carles Collet reverted back to NSR  Objective:   Vital Signs : Filed Vitals:   02/26/15 2000 02/26/15 2053 02/27/15 0500 02/27/15 0840  BP: 125/72 151/89 143/66 135/82  Pulse: 64 116 62 78  Temp:  97.6 F (36.4 C) 98.3 F (36.8 C)   TempSrc:      Resp: '13 16 20   ' Height:      Weight:   200 lb (90.719 kg)   SpO2: 99% 98% 99%     Intake/Output from previous day:  Intake/Output Summary (Last 24 hours) at 02/27/15 0921 Last data filed at 02/27/15 0849  Gross per 24 hour  Intake    720 ml  Output      0 ml  Net    720 ml    I/O since admission:  Wt Readings from Last 3 Encounters:  02/27/15 200 lb (90.719 kg)  03/20/13 194 lb 7.1 oz (88.2 kg)  11/21/10 209 lb (94.802 kg)    Medications: . aspirin EC  81 mg Oral Daily  . atorvastatin  40 mg Oral q1800  . calcium-vitamin D   Oral BID WC  . heparin  5,000 Units Subcutaneous 3 times per day  . insulin aspart  0-9 Units Subcutaneous TID WC  . metoprolol  5 mg Intravenous Once  . metoprolol tartrate  25 mg Oral BID  . off the beat book   Does not apply Once  . olopatadine  1 drop Both Eyes BID  . omega-3 acid ethyl esters  1 g Oral Daily  . pantoprazole  40 mg Oral Daily  . sodium chloride flush  3 mL Intravenous Q12H  . vitamin E  800 Units Oral Daily       Physical Exam:   General appearance: alert and no distress Neck: no adenopathy, no carotid bruit, no JVD, supple, symmetrical, trachea midline and thyroid not enlarged, symmetric, no tenderness/mass/nodules Lungs: clear to auscultation bilaterally Heart: regular rate and rhythm; 1/6 systolic murmur; no s3 or s4 Abdomen: soft, non-tender; bowel sounds normal; no masses,  no organomegaly Extremities: no edema, redness or tenderness in the calves or thighs Pulses: 2+ and symmetric Skin: Skin color, texture, turgor normal. No rashes or lesions Neurologic: Grossly normal   Rate: 84  Rhythm: normal sinus rhythm  ECG (independently read by me): SVT at 134  Lab Results:   Recent Labs  02/25/15 2219 02/26/15 0452 02/26/15 1558  NA  --  141  --   K  --  3.3*  --   CL  --  106  --   CO2  --  25  --   GLUCOSE  --  129*  --   BUN  --  12  --   CREATININE  --  1.29* 1.11  CALCIUM  --  9.0  --   MG 1.8  --   --     Hepatic Function Latest Ref Rng 02/26/2015 03/22/2013 03/20/2013  Total Protein 6.5 - 8.1 g/dL 5.8(L) 5.5(L) 7.2  Albumin 3.5 - 5.0 g/dL 2.9(L) 2.9(L) 3.7  AST 15 - 41 U/L '18 12 19  ' ALT 17 - 63 U/L 11(L) 9 13  Alk Phosphatase 38 - 126 U/L 51 48 60  Total Bilirubin 0.3 - 1.2 mg/dL 1.1 1.1 1.0    Recent Labs  02/26/15 0452 02/26/15 0939 02/26/15 1558 02/27/15 0408  WBC 8.3 6.9 7.9  7.1  NEUTROABS 4.3  --   --   --   HGB 12.4* 12.5* 12.6* 11.8*  HCT 36.9* 37.9* 37.9* 35.9*  MCV 82.2 82.6 82.4 83.1  PLT 195 201 202 185     Recent Labs  02/25/15 2219 02/26/15 0452 02/26/15 0939  TROPONINI <0.03 <0.03 <0.03    Lab Results  Component Value Date   TSH 2.125 02/25/2015    Recent Labs  02/25/15 2219  HGBA1C 6.6*     Recent Labs  02/26/15 0452  PROT 5.8*  ALBUMIN 2.9*  AST 18  ALT 11*  ALKPHOS 51  BILITOT 1.1    Recent Labs  02/25/15 2219  INR 1.07   BNP (last 3 results)  Recent Labs  02/25/15 2219  BNP 128.7*    ProBNP (last 3 results) No results for input(s): PROBNP in the last 8760 hours.   Lipid Panel     Component Value Date/Time   CHOL 189 02/26/2015 0939   TRIG 61 02/26/2015 0939   HDL 65 02/26/2015 0939   CHOLHDL 2.9 02/26/2015 0939   VLDL 12 02/26/2015 0939   LDLCALC 112* 02/26/2015 0939    Imaging:  No results found.    Assessment/Plan:   Principal Problem:   Tachyarrhythmia Active Problems:   Type II diabetes mellitus ([place])   Essential hypertension, benign   Obstructive sleep apnea   Chest pain   Hypokalemia   Paroxysmal SVT (supraventricular tachycardia) ([place])   1.  SVT: developed recurrent brief episode this am while on lopressor 12.5 mg bid; converted back to NSR in the 90's prior to receiving iv metoprolol.  Will increase metoprolol to 50 mg bid dose rather than 25 bid. Probably keep in hospital today to observe to make certain no recurrence and dc tomorrow.\  2. S/p cath yesteerday revealing normal coronaries; EF 50%. R radial site stable   3. HTN:   4. Hyperlipidemia  5. OSA on CPAP   Troy Sine, MD, [facility] 02/27/2015, 9:21 AM

## 2015-02-27 NOTE — Progress Notes (Signed)
PROGRESS NOTE  BERLEY TIBBETTS [id number] DOB: 1933-06-21 DOA: 02/25/2015 PCP: Donnajean Lopes, MD Brief History 82 y/o male with history of essential hypertension, type 2 diabetes, and obstructive sleep apnea who presented to [facility] with several days of chest tightness with palpitations and dyspnea. Patient reports that he had been in his usual state of health with no recent fever, chills, nausea, vomiting, or diarrhea. Approximately 3 days prior to admission, he complained of transient episodes marked by left-sided chest tightness with palpitations and mild dyspnea. His episodes resolve spontaneously over the course of several minutes.  At the outside hospital, the patient was noted to have rapid junctional type rhythm with ST depression in V2 to V3 that subsequently developed into an atrial tachyarrhythmia. The patient was given 10 mg pushes of diltiazem 3 and returned to sinus rhythm. The patient was transferred to [facility], for definitive care. Cardiology was consulted, and the patient underwent heart catheterization on 12/26/2015. Assessment/Plan: Atrial tachyarrhythmia--?atrial tachycardia vs afib -Appreciate cardiology consultation -Continue metoprolol tartrate -Presently in sinus rhythm -02/26/2015 Echo--EF 60-65%, grade 1 DD, no WMA -TSH 2.125 -02/27/15 am--went back into SVT HR 130s-->increase metoprolol to 25 mg bid -give lopressor 5 mg IV--if no better, start diltiazem drip (current BP168/103) -02/27/15 EKG--SVT, nonspecific ST-T changes -STAT bmp Chest pain and dyspnea on exertion -02/26/2015 heart catheterization--patent coronaries; EF 50% -Troponins negative -Appreciate cardiology follow-up -Patient initially placed on intravenous heparin which has been since discontinued after the heart catheterization -check D-Dimer Diabetes mellitus type 2 -Hold metformin -02/25/2015 hemoglobin A1c 6.6 Hypertension -olmesartan on hold Acute kidney  injury -serum creatinine 1.5 at outside hospital -baseline creatinine ~1.0-1.1  Family Communication:   Wife updated at beside Disposition Plan:   Home 1-2 days     Procedures/Studies:  No results found.      Subjective: Pt developed afib with RVR during my examination.  C/o flutter sensation in chest.  Denies sob, dizziness, n/v/d, abd pain, dysuria, hematuria.  No headache or rash.  Objective: Filed Vitals:   02/26/15 1700 02/26/15 2000 02/26/15 2053 02/27/15 0500  BP: 136/87 125/72 151/89 143/66  Pulse: 60 64 116 62  Temp:   97.6 F (36.4 C) 98.3 F (36.8 C)  TempSrc:      Resp: 11 13 16 20   Height:      Weight:    90.719 kg (200 lb)  SpO2: 98% 99% 98% 99%    Intake/Output Summary (Last 24 hours) at 02/27/15 0810 Last data filed at 02/27/15 0500  Gross per 24 hour  Intake    480 ml  Output    250 ml  Net    230 ml   Weight change: 1.043 kg (2 lb 4.8 oz) Exam:   General:  Pt is alert, follows commands appropriately, not in acute distress  HEENT: No icterus, No thrush, No neck mass, [place]/AT  Cardiovascular: IRRR, S1/S2, no rubs, no gallops  Respiratory: CTA bilaterally, no wheezing, no crackles, no rhonchi  Abdomen: Soft/+BS, non tender, non distended, no guarding; no HSM  Extremities: trace edema, No lymphangitis, No petechiae, No rashes, no synovitis; no clubbing or cyanosis  Data Reviewed: Basic Metabolic Panel:  Recent Labs Lab 02/25/15 2219 02/26/15 0452 02/26/15 1558  NA  --  141  --   K  --  3.3*  --   CL  --  106  --   CO2  --  25  --   GLUCOSE  --  129*  --   BUN  --  12  --   CREATININE  --  1.29* 1.11  CALCIUM  --  9.0  --   MG 1.8  --   --    Liver Function Tests:  Recent Labs Lab 02/26/15 0452  AST 18  ALT 11*  ALKPHOS 51  BILITOT 1.1  PROT 5.8*  ALBUMIN 2.9*   No results for input(s): LIPASE, AMYLASE in the last 168 hours. No results for input(s): AMMONIA in the last 168 hours. CBC:  Recent Labs Lab  02/26/15 0452 02/26/15 0939 02/26/15 1558 02/27/15 0408  WBC 8.3 6.9 7.9 7.1  NEUTROABS 4.3  --   --   --   HGB 12.4* 12.5* 12.6* 11.8*  HCT 36.9* 37.9* 37.9* 35.9*  MCV 82.2 82.6 82.4 83.1  PLT 195 201 202 185   Cardiac Enzymes:  Recent Labs Lab 02/25/15 2219 02/26/15 0452 02/26/15 0939  TROPONINI <0.03 <0.03 <0.03   BNP: Invalid input(s): POCBNP CBG:  Recent Labs Lab 02/26/15 0733 02/26/15 1218 02/26/15 1633 02/26/15 2056 02/27/15 0744  GLUCAP 153* 100* 105* 143* 136*    No results found for this or any previous visit (from the past 240 hour(s)).   Scheduled Meds: . aspirin EC  81 mg Oral Daily  . atorvastatin  40 mg Oral q1800  . calcium-vitamin D   Oral BID WC  . heparin  5,000 Units Subcutaneous 3 times per day  . insulin aspart  0-9 Units Subcutaneous TID WC  . metoprolol tartrate  12.5 mg Oral BID  . off the beat book   Does not apply Once  . olopatadine  1 drop Both Eyes BID  . omega-3 acid ethyl esters  1 g Oral Daily  . pantoprazole  40 mg Oral Daily  . sodium chloride flush  3 mL Intravenous Q12H  . vitamin E  800 Units Oral Daily   Continuous Infusions:    Ramesses Crampton, DO  [facility] Pager [phone]  If 7PM-7AM, please contact night-coverage [email or web address] Password [email or web address] 02/27/2015, 8:10 AM   LOS: 2 days

## 2015-02-27 NOTE — Progress Notes (Signed)
Pt refuses CPAP, pt noted previously doesn't like comfort on machine.

## 2015-02-27 NOTE — Progress Notes (Signed)
At 1345 CMT notified RN of patient's HR up to 120.  Dr.  Claiborne Billings notified.  He stated to obtain EKG and monitor patient.    1350:  EKG= [facility],  Vin Bhagat, PA paged.  Will continue to monitor.

## 2015-02-28 DIAGNOSIS — I350 Nonrheumatic aortic (valve) stenosis: Secondary | ICD-10-CM | POA: Insufficient documentation

## 2015-02-28 DIAGNOSIS — E876 Hypokalemia: Secondary | ICD-10-CM

## 2015-02-28 DIAGNOSIS — I359 Nonrheumatic aortic valve disorder, unspecified: Secondary | ICD-10-CM

## 2015-02-28 LAB — CBC
HEMATOCRIT: 35.8 % — AB (ref 39.0–52.0)
HEMOGLOBIN: 11.8 g/dL — AB (ref 13.0–17.0)
MCH: 27.6 pg (ref 26.0–34.0)
MCHC: 33 g/dL (ref 30.0–36.0)
MCV: 83.6 fL (ref 78.0–100.0)
Platelets: 189 10*3/uL (ref 150–400)
RBC: 4.28 MIL/uL (ref 4.22–5.81)
RDW: 18 % — ABNORMAL HIGH (ref 11.5–15.5)
WBC: 7.4 10*3/uL (ref 4.0–10.5)

## 2015-02-28 LAB — GLUCOSE, CAPILLARY
GLUCOSE-CAPILLARY: 135 mg/dL — AB (ref 65–99)
Glucose-Capillary: 132 mg/dL — ABNORMAL HIGH (ref 65–99)
Glucose-Capillary: 99 mg/dL (ref 65–99)
Glucose-Capillary: 126 mg/dL — ABNORMAL HIGH (ref 65–99)

## 2015-02-28 MED ORDER — METOPROLOL TARTRATE 100 MG PO TABS: 100.0000 mg | ORAL_TABLET | Freq: Two times a day (BID) | ORAL | Status: DC

## 2015-02-28 MED ORDER — METOPROLOL TARTRATE 100 MG PO TABS
100.0000 mg | ORAL_TABLET | Freq: Two times a day (BID) | ORAL | Status: DC
  Administered 2015-02-28: 100 mg via ORAL
  Filled 2015-02-28 (×2): qty 1

## 2015-02-28 MED ORDER — METOPROLOL TARTRATE 1 MG/ML IV SOLN: 2.5000 mg | Freq: Four times a day (QID) | INTRAVENOUS | Status: DC | PRN

## 2015-02-28 NOTE — Care Management Important Message (Signed)
Important Message  Patient Details  Name: Robert Mcknight MRN: [id number] Date of Birth: 1933-12-29   Medicare Important Message Given:  Yes    Apolonio Schneiders, RN 02/28/2015, 11:04 AM

## 2015-02-28 NOTE — Progress Notes (Addendum)
Subjective:  No chest pain; went back into SVT at 134 yesterday reverted back to NSR. BB was increased. Recurrent SVT today again at 116 despite getting metoprolol 50 mg x3 yesterday.  Objective:   Vital Signs : Filed Vitals:   02/27/15 1754 02/27/15 2100 02/27/15 2312 02/28/15 0500  BP: 143/83 113/71 126/73 140/63  Pulse: 86 63 62 59  Temp:  97.6 F (36.4 C)  98 F (36.7 C)  TempSrc:      Resp:  20    Height:      Weight:    198 lb (89.812 kg)  SpO2:  100%  100%    Intake/Output from previous day:  Intake/Output Summary (Last 24 hours) at 02/28/15 0959 Last data filed at 02/28/15 0500  Gross per 24 hour  Intake    420 ml  Output    375 ml  Net     45 ml    I/O since admission:  Wt Readings from Last 3 Encounters:  02/28/15 198 lb (89.812 kg)  03/20/13 194 lb 7.1 oz (88.2 kg)  11/21/10 209 lb (94.802 kg)    Medications: . aspirin EC  81 mg Oral Daily  . atorvastatin  40 mg Oral q1800  . calcium-vitamin D   Oral BID WC  . heparin  5,000 Units Subcutaneous 3 times per day  . insulin aspart  0-9 Units Subcutaneous TID WC  . metoprolol  5 mg Intravenous Once  . metoprolol tartrate  50 mg Oral TID  . off the beat book   Does not apply Once  . olopatadine  1 drop Both Eyes BID  . omega-3 acid ethyl esters  1 g Oral Daily  . pantoprazole  40 mg Oral Daily  . sodium chloride flush  3 mL Intravenous Q12H  . vitamin E  800 Units Oral Daily       Physical Exam:   General appearance: alert and no distress Neck: no adenopathy, no carotid bruit, no JVD, supple, symmetrical, trachea midline and thyroid not enlarged, symmetric, no tenderness/mass/nodules Lungs: clear to auscultation bilaterally Heart: regular rate and rhythm; 1/6 systolic murmur; no s3 or s4 Abdomen: soft, non-tender; bowel sounds normal; no masses,  no organomegaly Extremities: no edema, redness or tenderness in the calves or thighs Pulses: 2+ and symmetric Skin: Skin color, texture, turgor  normal. No rashes or lesions Neurologic: Grossly normal   Rate: 116  Rhythm:SVT  STAT ECG (independently read by me): today during episode: Accelerated junctional tachycardia at 118; NSSTT changes.   Prior ECG (independently read by me): SVT at 134  Lab Results:   Recent Labs  02/25/15 2219 02/26/15 0452 02/26/15 1558 02/27/15 0913  NA  --  141  --  140  K  --  3.3*  --  4.0  CL  --  106  --  104  CO2  --  25  --  24  GLUCOSE  --  129*  --  168*  BUN  --  12  --  14  CREATININE  --  1.29* 1.11 1.31*  CALCIUM  --  9.0  --  9.4  MG 1.8  --   --   --     Hepatic Function Latest Ref Rng 02/26/2015 03/22/2013 03/20/2013  Total Protein 6.5 - 8.1 g/dL 5.8(L) 5.5(L) 7.2  Albumin 3.5 - 5.0 g/dL 2.9(L) 2.9(L) 3.7  AST 15 - 41 U/L '18 12 19  ' ALT 17 - 63 U/L 11(L) 9 13  Alk Phosphatase  38 - 126 U/L 51 48 60  Total Bilirubin 0.3 - 1.2 mg/dL 1.1 1.1 1.0    Recent Labs  02/26/15 0452  02/26/15 1558 02/27/15 0408 02/28/15 0400  WBC 8.3  < > 7.9 7.1 7.4  NEUTROABS 4.3  --   --   --   --   HGB 12.4*  < > 12.6* 11.8* 11.8*  HCT 36.9*  < > 37.9* 35.9* 35.8*  MCV 82.2  < > 82.4 83.1 83.6  PLT 195  < > 202 185 189  < > = values in this interval not displayed.   Recent Labs  02/25/15 2219 02/26/15 0452 02/26/15 0939  TROPONINI <0.03 <0.03 <0.03    Lab Results  Component Value Date   TSH 2.125 02/25/2015    Recent Labs  02/25/15 2219  HGBA1C 6.6*     Recent Labs  02/26/15 0452  PROT 5.8*  ALBUMIN 2.9*  AST 18  ALT 11*  ALKPHOS 51  BILITOT 1.1    Recent Labs  02/25/15 2219  INR 1.07   BNP (last 3 results)  Recent Labs  02/25/15 2219  BNP 128.7*    ProBNP (last 3 results) No results for input(s): PROBNP in the last 8760 hours.   Lipid Panel     Component Value Date/Time   CHOL 189 02/26/2015 0939   TRIG 61 02/26/2015 0939   HDL 65 02/26/2015 0939   CHOLHDL 2.9 02/26/2015 0939   VLDL 12 02/26/2015 0939   LDLCALC 112* 02/26/2015 0939     Imaging:  Dg Chest 2 View  02/27/2015  CLINICAL DATA:  Patient with shortness of breath for 5 days. EXAM: CHEST  2 VIEW COMPARISON:  Chest radiograph 10/04/2009 FINDINGS: Stable cardiac and mediastinal contours. No consolidative pulmonary opacities. No pleural effusion or pneumothorax. Thoracic spine degenerative changes. IMPRESSION: No active cardiopulmonary disease. Electronically Signed   By: Lovey Newcomer M.D.   On: 02/27/2015 17:22   Nm Pulmonary Perf And Vent  02/27/2015  CLINICAL DATA:  Patient with shortness of breath for multiple days. EXAM: NUCLEAR MEDICINE VENTILATION - PERFUSION LUNG SCAN TECHNIQUE: Ventilation images were obtained in multiple projections using inhaled aerosol Tc-66mDTPA. Perfusion images were obtained in multiple projections after intravenous injection of Tc-922mAA. RADIOPHARMACEUTICALS:  3037.8illicuries [id number] aerosol inhalation and 4.1 millicuries [id number] IV COMPARISON:  Chest radiograph 02/27/2015 FINDINGS: Ventilation: Multiple defects are demonstrated within the right and left lungs. Perfusion: Multiple matched defects are demonstrated throughout the right and left lungs, suggestive of COPD. No non matched defects are identified. IMPRESSION: Very low probability for pulmonary embolism (PA absent). Electronically Signed   By: DrLovey Newcomer.D.   On: 02/27/2015 17:34    ------------------------------------------------------------------- ECHO Study Conclusions  - Left ventricle: The cavity size was normal. Wall thickness was increased in a pattern of mild LVH. Systolic function was normal. The estimated ejection fraction was in the range of 60% to 65%. Wall motion was normal; there were no regional wall motion abnormalities. Doppler parameters are consistent with abnormal left ventricular relaxation (grade 1 diastolic dysfunction). - Aortic valve: There was mild stenosis. Valve area (VTI): 1.43 cm^2. Valve area (Vmax): 1.41  cm^2. Valve area (Vmean): 1.28 cm^2.  Assessment/Plan:   Principal Problem:   Tachyarrhythmia Active Problems:   Type II diabetes mellitus ([place]  Essential hypertension, benign   Obstructive sleep apnea   Chest pain   Hypokalemia   Paroxysmal SVT (supraventricular tachycardia) ([place]  1. SVT: developed recurrent brief  episode yesterday while on lopressor 12.5 mg bid; converted back to NSR in the 90's prior to receiving iv metoprolol. Metoprolol was increased  50 mg and had another later episode yesterday so received 50 mg x3.  Recurrent SVT today. ECG now suggests an accelerated junctional rhythm.  Will change to 100 mg bid and give iv 2.5 -5 mg PRN HR> 120 if sustained.  Echo EF 60 - 65% with LVH with very mild AS.  2. S/p cath yesterday revealing normal coronaries; EF 50%. R radial site stable   3. Mild AS on echo with peak 17 and mean gradient 10; AVA 1.4 cm 2  3. HTN: BP 140/63 today  4. Hyperlipidemia  5. OSA on CPAP  Time spent: 35 minutes  Troy Sine, MD, [facility] 02/28/2015, 9:59 AM

## 2015-02-28 NOTE — Progress Notes (Signed)
PROGRESS NOTE  Robert Mcknight [id number] DOB: 07-Feb-1933 DOA: 02/25/2015 PCP: Donnajean Lopes, MD  Brief History 82 y/o male with history of essential hypertension, type 2 diabetes, and obstructive sleep apnea who presented to [facility] with several days of chest tightness with palpitations and dyspnea. Patient reports that he had been in his usual state of health with no recent fever, chills, nausea, vomiting, or diarrhea. Approximately 3 days prior to admission, he complained of transient episodes marked by left-sided chest tightness with palpitations and mild dyspnea. His episodes resolve spontaneously over the course of several minutes. At the outside hospital, the patient was noted to have rapid junctional type rhythm with ST depression in V2 to V3 that subsequently developed into an atrial tachyarrhythmia. The patient was given 10 mg pushes of diltiazem 3 and returned to sinus rhythm. The patient was transferred to [facility], for definitive care. Cardiology was consulted, and the patient underwent heart catheterization on 12/26/2015 which revealed patent coronaries. Assessment/Plan: Atrial tachyarrhythmia--?atrial tachycardia vs afib -Appreciate cardiology follow up -Continue metoprolol tartrate -Still having episodes SVT after increase metoprolol 02/27/15-->increased metoprolol tartrate to 100mg  bid -02/26/2015 Echo--EF 60-65%, grade 1 DD, no WMA -TSH 2.125 -AM bmp Chest pain and dyspnea on exertion -02/26/2015 heart catheterization--patent coronaries; EF 50% -Troponins negative -Appreciate cardiology follow-up -Patient initially placed on intravenous heparin which has been since discontinued after the heart catheterization -check D-Dimer--0.52 -V/Q scan--very low probability Diabetes mellitus type 2 -Hold metformin -02/25/2015 hemoglobin A1c 6.6 Hypertension -olmesartan on hold -continue metoprolol Acute kidney injury -serum creatinine 1.5 at outside  hospital -baseline creatinine ~1.0-1.1  Family Communication: Wife updated at beside Disposition Plan: Home 1-2 days     Procedures/Studies: Dg Chest 2 View  02/27/2015  CLINICAL DATA:  Patient with shortness of breath for 5 days. EXAM: CHEST  2 VIEW COMPARISON:  Chest radiograph 10/04/2009 FINDINGS: Stable cardiac and mediastinal contours. No consolidative pulmonary opacities. No pleural effusion or pneumothorax. Thoracic spine degenerative changes. IMPRESSION: No active cardiopulmonary disease. Electronically Signed   By: Lovey Newcomer M.D.   On: 02/27/2015 17:22   Nm Pulmonary Perf And Vent  02/27/2015  CLINICAL DATA:  Patient with shortness of breath for multiple days. EXAM: NUCLEAR MEDICINE VENTILATION - PERFUSION LUNG SCAN TECHNIQUE: Ventilation images were obtained in multiple projections using inhaled aerosol Tc-73m DTPA. Perfusion images were obtained in multiple projections after intravenous injection of Tc-84m MAA. RADIOPHARMACEUTICALS:  0000000 millicuries [id number] DTPA aerosol inhalation and 4.1 millicuries [id number] MAA IV COMPARISON:  Chest radiograph 02/27/2015 FINDINGS: Ventilation: Multiple defects are demonstrated within the right and left lungs. Perfusion: Multiple matched defects are demonstrated throughout the right and left lungs, suggestive of COPD. No non matched defects are identified. IMPRESSION: Very low probability for pulmonary embolism (PA absent). Electronically Signed   By: Lovey Newcomer M.D.   On: 02/27/2015 17:34         Subjective: Pt is c/o intermitten chest discomfort and fluttering sensation when in SVT.  Denies cp, sob, n/v/d, abdominal pain, dysuria, HA  Objective: Filed Vitals:   02/27/15 2100 02/27/15 2312 02/28/15 0500 02/28/15 1042  BP: 113/71 126/73 140/63 166/94  Pulse: 63 62 59 118  Temp: 97.6 F (36.4 C)  98 F (36.7 C)   TempSrc:      Resp: 20     Height:      Weight:   89.812 kg (198 lb)   SpO2: 100%  100%  Intake/Output Summary (Last 24 hours) at 02/28/15 1100 Last data filed at 02/28/15 0900  Gross per 24 hour  Intake    480 ml  Output    575 ml  Net    -95 ml   Weight change: -0.907 kg (-2 lb) Exam:   General:  Pt is alert, follows commands appropriately, not in acute distress  HEENT: No icterus, No thrush, No neck mass, [place]/AT  Cardiovascular: RRR, S1/S2, no rubs, no gallops  Respiratory: CTA bilaterally, no wheezing, no crackles, no rhonchi  Abdomen: Soft/+BS, non tender, non distended, no guarding  Extremities: trace LE edema, No lymphangitis, No petechiae, No rashes, no synovitis  Data Reviewed: Basic Metabolic Panel:  Recent Labs Lab 02/25/15 2219 02/26/15 0452 02/26/15 1558 02/27/15 0913  NA  --  141  --  140  K  --  3.3*  --  4.0  CL  --  106  --  104  CO2  --  25  --  24  GLUCOSE  --  129*  --  168*  BUN  --  12  --  14  CREATININE  --  1.29* 1.11 1.31*  CALCIUM  --  9.0  --  9.4  MG 1.8  --   --   --    Liver Function Tests:  Recent Labs Lab 02/26/15 0452  AST 18  ALT 11*  ALKPHOS 51  BILITOT 1.1  PROT 5.8*  ALBUMIN 2.9*   No results for input(s): LIPASE, AMYLASE in the last 168 hours. No results for input(s): AMMONIA in the last 168 hours. CBC:  Recent Labs Lab 02/26/15 0452 02/26/15 0939 02/26/15 1558 02/27/15 0408 02/28/15 0400  WBC 8.3 6.9 7.9 7.1 7.4  NEUTROABS 4.3  --   --   --   --   HGB 12.4* 12.5* 12.6* 11.8* 11.8*  HCT 36.9* 37.9* 37.9* 35.9* 35.8*  MCV 82.2 82.6 82.4 83.1 83.6  PLT 195 201 202 185 189   Cardiac Enzymes:  Recent Labs Lab 02/25/15 2219 02/26/15 0452 02/26/15 0939  TROPONINI <0.03 <0.03 <0.03   BNP: Invalid input(s): POCBNP CBG:  Recent Labs Lab 02/27/15 0744 02/27/15 1117 02/27/15 1637 02/27/15 2056 02/28/15 0817  GLUCAP 136* 107* 123* 118* 132*    No results found for this or any previous visit (from the past 240 hour(s)).   Scheduled Meds: . aspirin EC  81 mg Oral Daily  .  atorvastatin  40 mg Oral q1800  . calcium-vitamin D   Oral BID WC  . heparin  5,000 Units Subcutaneous 3 times per day  . insulin aspart  0-9 Units Subcutaneous TID WC  . metoprolol  5 mg Intravenous Once  . metoprolol tartrate  100 mg Oral BID  . off the beat book   Does not apply Once  . olopatadine  1 drop Both Eyes BID  . omega-3 acid ethyl esters  1 g Oral Daily  . pantoprazole  40 mg Oral Daily  . sodium chloride flush  3 mL Intravenous Q12H  . vitamin E  800 Units Oral Daily   Continuous Infusions:    Madhavi Hamblen, DO  [facility] Pager [phone]  If 7PM-7AM, please contact night-coverage [email or web address] Password [email or web address] 02/28/2015, 11:00 AM   LOS: 3 days

## 2015-02-28 NOTE — Plan of Care (Signed)
Problem: Education: Goal: Knowledge of [facility] Education information/materials will improve Outcome: Progressing Pain rating discussed pt acknowledged understanding. Pt denies any pain.

## 2015-02-28 NOTE — Plan of Care (Signed)
Problem: Education: Goal: Understanding of medication regimen will improve Outcome: Progressing Explain medications and medication uses each time of administration

## 2015-03-01 ENCOUNTER — Other Ambulatory Visit: Payer: Self-pay | Admitting: Physician Assistant

## 2015-03-01 ENCOUNTER — Encounter (HOSPITAL_COMMUNITY): Payer: Self-pay | Admitting: Interventional Cardiology

## 2015-03-01 DIAGNOSIS — I471 Supraventricular tachycardia, unspecified: Secondary | ICD-10-CM

## 2015-03-01 LAB — CBC
HEMATOCRIT: 36.6 % — AB (ref 39.0–52.0)
HEMOGLOBIN: 12.3 g/dL — AB (ref 13.0–17.0)
MCH: 27.7 pg (ref 26.0–34.0)
MCHC: 33.6 g/dL (ref 30.0–36.0)
MCV: 82.4 fL (ref 78.0–100.0)
Platelets: 195 10*3/uL (ref 150–400)
RBC: 4.44 MIL/uL (ref 4.22–5.81)
RDW: 17.7 % — AB (ref 11.5–15.5)
WBC: 6.8 10*3/uL (ref 4.0–10.5)

## 2015-03-01 LAB — GLUCOSE, CAPILLARY
GLUCOSE-CAPILLARY: 87 mg/dL (ref 65–99)
GLUCOSE-CAPILLARY: 104 mg/dL — AB (ref 65–99)
Glucose-Capillary: 115 mg/dL — ABNORMAL HIGH (ref 65–99)

## 2015-03-01 LAB — BASIC METABOLIC PANEL
ANION GAP: 9 (ref 5–15)
BUN: 21 mg/dL — ABNORMAL HIGH (ref 6–20)
CALCIUM: 8.8 mg/dL — AB (ref 8.9–10.3)
CHLORIDE: 108 mmol/L (ref 101–111)
CO2: 22 mmol/L (ref 22–32)
Creatinine, Ser: 1.19 mg/dL (ref 0.61–1.24)
GFR calc non Af Amer: 56 mL/min — ABNORMAL LOW (ref >60)
GLUCOSE: 120 mg/dL — AB (ref 65–99)
Potassium: 3.7 mmol/L (ref 3.5–5.1)
Sodium: 139 mmol/L (ref 135–145)

## 2015-03-01 LAB — PSA: PSA: 0.89 ng/mL (ref 0.00–4.00)

## 2015-03-01 MED ORDER — METOPROLOL TARTRATE 50 MG PO TABS: 50.0000 mg | ORAL_TABLET | Freq: Two times a day (BID) | ORAL | Status: DC

## 2015-03-01 MED ORDER — METOPROLOL TARTRATE 50 MG PO TABS
50.0000 mg | ORAL_TABLET | Freq: Two times a day (BID) | ORAL | Status: DC
  Administered 2015-03-01: 50 mg via ORAL
  Filled 2015-03-01: qty 1

## 2015-03-01 MED ORDER — AMIODARONE HCL 200 MG PO TABS: 400.0000 mg | ORAL_TABLET | Freq: Every day | ORAL | Status: DC

## 2015-03-01 MED ORDER — ATORVASTATIN CALCIUM 40 MG PO TABS: 40.0000 mg | ORAL_TABLET | Freq: Every day | ORAL | Status: DC

## 2015-03-01 MED ORDER — AMIODARONE HCL 200 MG PO TABS
400.0000 mg | ORAL_TABLET | Freq: Every day | ORAL | Status: DC
  Administered 2015-03-01: 400 mg via ORAL
  Filled 2015-03-01: qty 2

## 2015-03-01 MED ORDER — ASPIRIN 81 MG PO TBEC: 81.0000 mg | DELAYED_RELEASE_TABLET | Freq: Every day | ORAL | Status: AC

## 2015-03-01 MED FILL — Heparin Sodium (Porcine) 2 Unit/ML in Sodium Chloride 0.9%: INTRAMUSCULAR | Qty: 500 | Status: AC

## 2015-03-01 NOTE — Discharge Instructions (Signed)
Paroxysmal Supraventricular Tachycardia Paroxysmal supraventricular tachycardia (PSVT) is a type of abnormal heart rhythm. It causes your heart to beat very quickly and then suddenly stop beating so quickly. A normal heart rate is 60-100 beats per minute. During an episode of PSVT, your heart rate may be 150-250 beats per minute. This can make you feel light-headed and short of breath. An episode of PSVT can be frightening. It is usually not dangerous. The heart has four chambers. All chambers need to work together for the heart to beat effectively. A normal heartbeat usually starts in the right upper chamber of the heart (atrium) when an area (sinoatrial node) puts out an electrical signal that spreads to the other chambers. People with PSVT may have abnormal electrical pathways, or they may have other areas in the upper chambers that send out electrical signals. The result is a very rapid heartbeat. When your heart beats very quickly, it does not have time to fill completely with blood. When PSVT happens often or it lasts for long periods, it can lead to heart weakness and failure. Most people with PSVT do not have any other heart disease. CAUSES Abnormal electrical activity in the heart causes PSVT. It is not known why some people get PSVT and others do not. RISK FACTORS You may be more likely to have PSVT if:  You are 20-30 years old.  You are a woman. Other factors that may increase your chances of an attack include:  Stress.  Being tired.  Smoking.  Stimulant drugs.  Alcoholic drinks.  Caffeine.  Pregnancy. SIGNS AND SYMPTOMS A mild episode of PSVT may cause no symptoms. If you do have signs and symptoms, they may include:  A pounding heart.  Feeling of skipped heartbeats (palpitations).  Weakness.  Shortness of breath.  Tightness or pain in your chest.  Light-headedness.  Anxiety.  Dizziness.  Sweating.  Nausea.  A fainting spell. DIAGNOSIS Your health care  provider may suspect PSVT if you have symptoms that come and go. The health care provider will do a physical exam. If you are having an episode during the exam, the health care provider may be able to diagnose PSVT by listening to your heart and feeling your pulse. Tests may also be done, including:  An electrical study of your heart (electrocardiogram, or ECG).  A test in which you wear a portable ECG monitor all day (Holter monitor) or for several days (event monitor).  A test that involves taking an image of your heart using sound waves (echocardiogram) to rule out other causes of a fast heart rate. TREATMENT You may not need treatment if episodes of PSVT do not happen often or if they do not cause symptoms. If PSVT episodes do cause symptoms, your health care provider may first suggest trying a self-treatment called vagus nerve stimulation. The vagus nerve extends down from the brain. It regulates certain body functions. Stimulating this nerve can slow down the heart. Your health care provider can teach you ways to do this. You may need to try a few ways to find what works best for you. Options include:  Holding your breath and pushing, as though you are having a bowel movement.  Massaging an area on one side of your neck below your jaw.  Bending forward with your head between your legs.  Bending forward with your head between your legs and coughing.  Massaging your eyeballs with your eyes closed. If vagus nerve stimulation does not work, other treatment options include:    Medicines to prevent an attack.  Being treated in the hospital with medicine or electric shock to stop an attack (cardioversion). This treatment can include:  Getting medicine through an IV line.  Having a small electric shock delivered to your heart. You will be given medicine to make you sleep through this procedure.  If you have frequent episodes with symptoms, you may need a procedure to get rid of the faulty  areas of your heart (radiofrequency ablation) and end the episodes of PSVT. In this procedure:  A long, thin tube (catheter) is passed through one of your veins into your heart.  Energy directed through the catheter eliminates the areas of your heart that are causing abnormal electric stimulation. HOME CARE INSTRUCTIONS  Take medicines only as directed by your health care provider.  Do not use caffeine in any form if caffeine triggers episodes of PSVT. Otherwise, consume caffeine in moderation. This means no more than a few cups of coffee or the equivalent each day.  Do not drink alcohol if alcohol triggers episodes of PSVT. Otherwise, limit alcohol intake to no more than 1 drink per day for nonpregnant women and 2 drinks per day for men. One drink equals 12 ounces of beer, 5 ounces of wine, or 1 ounces of hard liquor.  Do not use any tobacco products, including cigarettes, chewing tobacco, or electronic cigarettes. If you need help quitting, ask your health care provider.  Try to get at least 7 hours of sleep each night.  Find healthy ways to manage stress.  Perform vagus nerve stimulation as directed by your health care provider.  Maintain a healthy weight.  Get some exercise on most days. Ask your health care provider to suggest some good activities for you. SEEK MEDICAL CARE IF:  You are having episodes of PSVT more often, or they are lasting longer.  Vagus nerve stimulation is no longer helping.  You have new symptoms during an episode. SEEK IMMEDIATE MEDICAL CARE IF:  You have chest pain or trouble breathing.  You have an episode of PSVT that has lasted longer than 20 minutes.  You have passed out from an episode of PSVT. These symptoms may represent a serious problem that is an emergency. Do not wait to see if the symptoms will go away. Get medical help right away. Call your local emergency services (911 in the [place] Do not drive yourself to the hospital.   This  information is not intended to replace advice given to you by your health care provider. Make sure you discuss any questions you have with your health care provider.   Document Released: 01/09/2005 Document Revised: 01/30/2014 Document Reviewed: 06/19/2013 Elsevier Interactive Patient Education 2016 [employer]  

## 2015-03-01 NOTE — Discharge Summary (Signed)
Physician Discharge Summary  Robert Mcknight [id number] DOB: 1933/03/07 DOA: 02/25/2015  PCP: Donnajean Lopes, MD  Admit date: 02/25/2015 Discharge date: 03/01/2015  Recommendations for Outpatient Follow-up:  1. Pt will need to follow up with PCP in 2 weeks post discharge 2. Please obtain BMP in one week  Discharge Diagnoses:  Atrial tachyarrhythmia--?atrial tachycardia/junctional tachycardia -Appreciate cardiology follow up -Continue metoprolol tartrate -Still having episodes SVT after increase metoprolol 02/27/15-->increased metoprolol tartrate to 50mg  bid -02/26/2015 Echo--EF 60-65%, grade 1 DD, no WMA -TSH 2.125 -The patient was started on amiodarone 400 mg daily for 14 days, then 200 mg daily -The patient will be discharged to see electrophysiology (03/29/15) in the outpatient setting with plans for Holter monitor after discharge Chest pain and dyspnea on exertion -02/26/2015 heart catheterization--patent coronaries; EF 50% -Troponins negative -Appreciate cardiology follow-up -Patient initially placed on intravenous heparin which has been since discontinued after the heart catheterization -check D-Dimer--0.52 -V/Q scan--very low probability Diabetes mellitus type 2 -Hold metformin -02/25/2015 hemoglobin A1c 6.6 Hypertension -olmesartan/HCTZ on hold--will not restart--also discontinue potassium -continue metoprolol--50 mg twice a day Acute kidney injury -serum creatinine 1.5 at outside hospital -baseline creatinine ~1.0-1.1 -Improved with hydration. Serum creatinine 1.19 on the day of discharge  Discharge Condition: stabke  Disposition: home Follow-up Information    Follow up with Will Meredith Leeds, MD On 03/29/2015.   Specialty:  Cardiology   Why:  at 9:30AM    Contact information:   [place] [id number] [phone]       Follow up with [place] on 03/11/2015.   Specialty:  Cardiology   Why:  @2 :30 with Lonn Georgia, PA-C for post hospital and holter monitor set up   Contact information:   [address] [place] [employer] [phone]      Diet:heart healthy Wt Readings from Last 3 Encounters:  03/01/15 89.994 kg (198 lb 6.4 oz)  03/20/13 88.2 kg (194 lb 7.1 oz)  11/21/10 94.802 kg (209 lb)    History of present illness:  82 y/o male with history of essential hypertension, type 2 diabetes, and obstructive sleep apnea who presented to [facility] with several days of chest tightness with palpitations and dyspnea. Patient reports that he had been in his usual state of health with no recent fever, chills, nausea, vomiting, or diarrhea. Approximately 3 days prior to admission, he complained of transient episodes marked by left-sided chest tightness with palpitations and mild dyspnea. His episodes resolve spontaneously over the course of several minutes. At the outside hospital, the patient was noted to have rapid junctional type rhythm with ST depression in V2 to V3 that subsequently developed into an atrial tachyarrhythmia. The patient was given 10 mg pushes of diltiazem 3 and returned to sinus rhythm. The patient was transferred to [facility] for definitive care. Cardiology was consulted, and the patient underwent heart catheterization on 12/26/2015 which revealed patent coronaries.  Consultants: cardiology  Discharge Exam: Filed Vitals:   03/01/15 1343 03/01/15 1640  BP:  152/79  Pulse: 68 56  Temp:  98.3 F (36.8 C)  Resp:  18   Filed Vitals:   03/01/15 0537 03/01/15 0548 03/01/15 1343 03/01/15 1640  BP: 171/71 154/68  152/79  Pulse: 58  68 56  Temp: 97.7 F (36.5 C)   98.3 F (36.8 C)  TempSrc: Oral   Oral  Resp: 14   18  Height:      Weight:  89.994  kg (198 lb 6.4 oz)    SpO2: 99%   100%   General: A&O x 3, NAD, pleasant, cooperative Cardiovascular: RRR, no rub, no gallop, no S3 Respiratory: CTAB, no wheeze, no rhonchi Abdomen:soft,  nontender, nondistended, positive bowel sounds Extremities: No edema, No lymphangitis, no petechiae  Discharge Instructions      Discharge Instructions    Diet - low sodium heart healthy    Complete by:  As directed      Increase activity slowly    Complete by:  As directed             Medication List    STOP taking these medications        amLODipine 5 MG tablet  Commonly known as:  NORVASC     naproxen sodium 220 MG tablet  Commonly known as:  ANAPROX     olmesartan-hydrochlorothiazide 40-12.5 MG tablet  Commonly known as:  BENICAR HCT     Potassium 99 MG Tabs     simvastatin 80 MG tablet  Commonly known as:  ZOCOR      TAKE these medications        amiodarone 200 MG tablet  Commonly known as:  PACERONE  Take 2 tablets (400 mg total) by mouth daily. X 13 days, then one tablet (200mg ) daily starting day 14  Start taking on:  03/02/2015     aspirin 81 MG EC tablet  Take 1 tablet (81 mg total) by mouth daily.     atorvastatin 40 MG tablet  Commonly known as:  LIPITOR  Take 1 tablet (40 mg total) by mouth daily at 6 PM.     BEPREVE 1.5 % Soln  Generic drug:  Bepotastine Besilate  Place 1 drop into both eyes 2 (two) times daily as needed. Vision not clear     CALCIUM CARBONATE-VITAMIN D3 PO  Take 1 tablet by mouth 2 (two) times daily. Calcium 600 mg and Vitamin  D 800 units     Fish Oil 1000 MG Cpdr  Take 2 capsules by mouth 2 (two) times daily.     metFORMIN 500 MG (MOD) 24 hr tablet  Commonly known as:  GLUMETZA  Take 500 mg by mouth 2 (two) times daily with a meal.     metoprolol 50 MG tablet  Commonly known as:  LOPRESSOR  Take 1 tablet (50 mg total) by mouth 2 (two) times daily.     omeprazole 20 MG capsule  Commonly known as:  PRILOSEC  Take 20 mg by mouth daily.     vitamin E 1000 UNIT capsule  Generic drug:  vitamin E  Take 1,000 Units by mouth daily.         The results of significant diagnostics from this hospitalization (including  imaging, microbiology, ancillary and laboratory) are listed below for reference.    Significant Diagnostic Studies: Dg Chest 2 View  02/27/2015  CLINICAL DATA:  Patient with shortness of breath for 5 days. EXAM: CHEST  2 VIEW COMPARISON:  Chest radiograph 10/04/2009 FINDINGS: Stable cardiac and mediastinal contours. No consolidative pulmonary opacities. No pleural effusion or pneumothorax. Thoracic spine degenerative changes. IMPRESSION: No active cardiopulmonary disease. Electronically Signed   By: Lovey Newcomer M.D.   On: 02/27/2015 17:22   Nm Pulmonary Perf And Vent  02/27/2015  CLINICAL DATA:  Patient with shortness of breath for multiple days. EXAM: NUCLEAR MEDICINE VENTILATION - PERFUSION LUNG SCAN TECHNIQUE: Ventilation images were obtained in multiple projections using inhaled aerosol Tc-65m DTPA. Perfusion  images were obtained in multiple projections after intravenous injection of Tc-24m MAA. RADIOPHARMACEUTICALS:  0000000 millicuries [id number] DTPA aerosol inhalation and 4.1 millicuries [id number] MAA IV COMPARISON:  Chest radiograph 02/27/2015 FINDINGS: Ventilation: Multiple defects are demonstrated within the right and left lungs. Perfusion: Multiple matched defects are demonstrated throughout the right and left lungs, suggestive of COPD. No non matched defects are identified. IMPRESSION: Very low probability for pulmonary embolism (PA absent). Electronically Signed   By: Lovey Newcomer M.D.   On: 02/27/2015 17:34     Microbiology: No results found for this or any previous visit (from the past 240 hour(s)).   Labs: Basic Metabolic Panel:  Recent Labs Lab 02/25/15 2219  02/26/15 0452 02/26/15 1558 02/27/15 0913 03/01/15 0553  NA  --   --  141  --  140 139  K  --   < > 3.3*  --  4.0 3.7  CL  --   --  106  --  104 108  CO2  --   --  25  --  24 22  GLUCOSE  --   --  129*  --  168* 120*  BUN  --   --  12  --  14 21*  CREATININE  --   --  1.29* 1.11 1.31* 1.19  CALCIUM  --   --   9.0  --  9.4 8.8*  MG 1.8  --   --   --   --   --   < > = values in this interval not displayed. Liver Function Tests:  Recent Labs Lab 02/26/15 0452  AST 18  ALT 11*  ALKPHOS 51  BILITOT 1.1  PROT 5.8*  ALBUMIN 2.9*   No results for input(s): LIPASE, AMYLASE in the last 168 hours. No results for input(s): AMMONIA in the last 168 hours. CBC:  Recent Labs Lab 02/26/15 0452 02/26/15 0939 02/26/15 1558 02/27/15 0408 02/28/15 0400 03/01/15 0553  WBC 8.3 6.9 7.9 7.1 7.4 6.8  NEUTROABS 4.3  --   --   --   --   --   HGB 12.4* 12.5* 12.6* 11.8* 11.8* 12.3*  HCT 36.9* 37.9* 37.9* 35.9* 35.8* 36.6*  MCV 82.2 82.6 82.4 83.1 83.6 82.4  PLT 195 201 202 185 189 195   Cardiac Enzymes:  Recent Labs Lab 02/25/15 2219 02/26/15 0452 02/26/15 0939  TROPONINI <0.03 <0.03 <0.03   BNP: Invalid input(s): POCBNP CBG:  Recent Labs Lab 02/28/15 1614 02/28/15 2114 03/01/15 0730 03/01/15 1151 03/01/15 1638  GLUCAP 99 126* 115* 87 104*    Time coordinating discharge:  Greater than 30 minutes  Signed:  Essam Lowdermilk, DO [facility] Pager: [phone] 03/01/2015, 7:11 PM

## 2015-03-01 NOTE — Care Management Note (Signed)
Case Management Note  Patient Details  Name: Robert Mcknight MRN: [id number] Date of Birth: [birth year]  Subjective/Objective:    Pt admitted for Tachyarrhythmia. Pt was a transfer from [facility]. Pt is from home with family support.          Action/Plan:No needs identified by CM at this time. Will continue to monitor for disposition needs.   Expected Discharge Date:                  Expected Discharge Plan:  Home/Self Care  In-House Referral:  NA  Discharge planning Services  CM Consult  Post Acute Care Choice:    Choice offered to:     DME Arranged:    DME Agency:     HH Arranged:    HH Agency:     Status of Service:  In process, will continue to follow  Medicare Important Message Given:  Yes Date Medicare IM Given:    Medicare IM give by:    Date Additional Medicare IM Given:    Additional Medicare Important Message give by:     If discussed at [place] of Stay Meetings, dates discussed:    Additional Comments:  Bethena Roys, RN 03/01/2015, 12:20 PM

## 2015-03-01 NOTE — Progress Notes (Signed)
Pt to receive Metoprolol 100 mg tonight -orders to hold for HR<55. Pts HR fluctuating between 52 and 57, pt sitting up watching football game.  Metoprolol held and will continue to monitor. Jessie Foot, RN

## 2015-03-01 NOTE — Progress Notes (Signed)
Notified by CCMD pts HR dropped to 38 non sustained.  Pt sleeping-HR sustaining upper 40's to 50's. Will continue to monitor. Jessie Foot, RN

## 2015-03-01 NOTE — Progress Notes (Addendum)
    Subjective:  Feeling better but still having occasional episodes of chest tightness and tachycardia palpitations.  Objective:  Vital Signs in the last 24 hours: Temp:  [97.7 F (36.5 C)-98 F (36.7 C)] 97.7 F (36.5 C) (02/06 0537) Pulse Rate:  [58-118] 58 (02/06 0537) Resp:  [14-18] 14 (02/06 0537) BP: (139-171)/(64-94) 154/68 mmHg (02/06 0548) SpO2:  [99 %-100 %] 99 % (02/06 0537) Weight:  [89.994 kg (198 lb 6.4 oz)] 89.994 kg (198 lb 6.4 oz) (02/06 0548)  Intake/Output from previous day: 02/05 0701 - 02/06 0700 In: 1140 [P.O.:1140] Out: 1550 [Urine:1550]  Physical Exam: Pt is alert and oriented, pleasant elderly male in NAD HEENT: normal Neck: JVP - normal Lungs: CTA bilaterally CV: RRR without murmur or gallop Abd: soft, NT, Positive BS, no hepatomegaly Ext: no C/C/E, distal pulses intact and equal Skin: warm/dry no rash   Lab Results:  Recent Labs  02/28/15 0400 03/01/15 0553  WBC 7.4 6.8  HGB 11.8* 12.3*  PLT 189 195    Recent Labs  02/27/15 0913 03/01/15 0553  NA 140 139  K 4.0 3.7  CL 104 108  CO2 24 22  GLUCOSE 168* 120*  BUN 14 21*  CREATININE 1.31* 1.19    Recent Labs  02/26/15 0939  TROPONINI <0.03    Cardiac Studies: Cardiac catheterization: Normal coronary arteries, LVEF estimated at 50%. Normal hemodynamics.  Tele: Sinus rhythm/sinus bradycardia with episodes of narrow complex tachycardia heart rate 120 bpm suspect atrial tachycardia or junctional tachycardia.  Assessment/Plan:  1. Supraventricular tachycardia: Suspect junctional tachycardia. His metoprolol is been increased to 100 mg twice daily and he is now having resting bradycardia but continues to have episodic runs of SVT. Reviewed options with him. I think amiodarone would be a reasonable consideration. I reviewed the side effect profile and required monitoring. Will ask for formal EP consultation for review of potential treatment options.  2. Aortic stenosis,  mild  3. Essential hypertension, controlled  Disposition: Pending EP evaluation. Might be reasonable to consider amiodarone, decrease metoprolol, followed by outpatient monitoring.  Sherren Mocha, M.D. 03/01/2015, 8:50 AM  Reviewed with Dr Caryl Comes and EP team. Plan initiate Amiodarone 400 mg daily x 2 weeks, then 200 mg daily. Will decrease metoprolol and will arrange FU visit 1-2 weeks with PA/NP.  At that time pt should be set up with 48 hour Holter then can see Dr Curt Bears for EP follow-up. Will take care of all cardiology FU arrangements. Pt and family have questions of anticoagulation. With no evidence of AFib or flutter, there is no indication for oral anticoagulation.  thx  Sherren Mocha 03/01/2015 1:00 PM

## 2015-03-11 ENCOUNTER — Encounter: Payer: Self-pay | Admitting: Physician Assistant

## 2015-03-11 ENCOUNTER — Ambulatory Visit (INDEPENDENT_AMBULATORY_CARE_PROVIDER_SITE_OTHER): Payer: Medicare Other

## 2015-03-11 ENCOUNTER — Ambulatory Visit (INDEPENDENT_AMBULATORY_CARE_PROVIDER_SITE_OTHER): Payer: Medicare Other | Admitting: Physician Assistant

## 2015-03-11 VITALS — BP 160/80 | HR 60 | Ht 69.0 in | Wt 205.0 lb

## 2015-03-11 DIAGNOSIS — I10 Essential (primary) hypertension: Secondary | ICD-10-CM | POA: Diagnosis not present

## 2015-03-11 DIAGNOSIS — I471 Supraventricular tachycardia, unspecified: Secondary | ICD-10-CM

## 2015-03-11 NOTE — Addendum Note (Signed)
Addended by: Lonn Georgia on: 03/11/2015 05:06 PM   Modules accepted: Level of Service

## 2015-03-11 NOTE — Progress Notes (Signed)
Cardiology Office Note   Date:  03/11/2015   ID:  Robert Mcknight, DOB August 04, 1933, MRN [id number]  PCP:  Robert Lopes, MD  Cardiologist:  Dr Robert Ide, PA-C   Chief Complaint  Patient presents with  . Follow-up    no chest pain, no swelling, no shortness of breath, no cramping, no dizziness or lightheadedness    History of Present Illness: Robert Mcknight is a 82 y.o. male with a history of DM, HTN, GERD, prostate CA, SEM, OSA.  D/C 02/6 after admit for atrial arrhythmia, possible atrial tach/SVT. D/c on amio and BB.   Robert Mcknight presents for post-hospital f/u  Since d/c from the hospital, Robert Mcknight has felt well. He has been tracking his BP/HR. His BP has generally been elevated, frequently > 150, rarely under 140. A couple of readings were < 120. His blood sugar was 90 this am. He has had no chest pain. He has had no DOE, LE edema, orthopnea or PND.   He has not had prolonged palpitations, presyncope. He has had a few episodes of brief flutters, but only a few seconds.    Past Medical History  Diagnosis Date  . Diabetes mellitus   . Hypertension   . Seasonal allergies   . GERD (gastroesophageal reflux disease)   . Prostate cancer ([place]) 2011  . Heart murmur   . Sleep apnea     Past Surgical History  Procedure Laterality Date  . Inguinal hernia repair  1992    left  . Transurethral resection of prostate    . Flexible sigmoidoscopy N/A 03/23/2013    Procedure: FLEXIBLE SIGMOIDOSCOPY;  Surgeon: Robert Beams, MD;  Location: [facility];  Service: Endoscopy;  Laterality: N/A;  . Nasal sinus surgery  02/01/15  . Cardiac catheterization N/A 02/26/2015    Procedure: Left Heart Cath and Coronary Angiography;  Surgeon: Robert Crome, MD;  Location: [facility];  Service: Cardiovascular;  Laterality: N/A;    Medication Sig  . amiodarone (PACERONE) 200 MG tablet Take 2 tablets (400 mg total) by mouth daily. X 13 days, then one tablet (200mg )  daily starting day 14  . aspirin EC 81 MG EC tablet Take 1 tablet (81 mg total) by mouth daily.  [name] Kitchen atorvastatin (LIPITOR) 40 MG tablet Take 1 tablet (40 mg total) by mouth daily at 6 PM.  . Bepotastine Besilate (BEPREVE) 1.5 % SOLN Place 1 drop into both eyes 2 (two) times daily as needed. Vision not clear  . Calcium Carb-Cholecalciferol (CALCIUM CARBONATE-VITAMIN D3 PO) Take 1 tablet by mouth 2 (two) times daily. Calcium 600 mg and Vitamin  D 800 units  . metFORMIN (GLUMETZA) 500 MG (MOD) 24 hr tablet Take 500 mg by mouth 2 (two) times daily with a meal.  . metoprolol (LOPRESSOR) 50 MG tablet Take 1 tablet (50 mg total) by mouth 2 (two) times daily.  . Omega-3 Fatty Acids (FISH OIL) 1000 MG CPDR Take 2 capsules by mouth 2 (two) times daily.   [name] Kitchen omeprazole (PRILOSEC) 20 MG capsule Take 20 mg by mouth daily.    . vitamin E (VITAMIN E) 1000 UNIT capsule Take 1,000 Units by mouth daily.   No current facility-administered medications for this visit.    Allergies:   Review of patient's allergies indicates no known allergies.    Social History:  The patient  reports that he has never smoked. He has never used smokeless tobacco. He reports that he does  not drink alcohol or use illicit drugs.   Family History:  The patient's family history includes Colon cancer in his brother; Colon cancer (age of onset: 15) in his brother. There is no history of Stomach cancer.    ROS:  Please see the history of present illness. All other systems are reviewed and negative.    PHYSICAL EXAM: VS:  BP 160/80 mmHg  Pulse 60  Ht 5\' 9"  (1.753 m)  Wt 205 lb (92.987 kg)  BMI 30.26 kg/m2 , BMI Body mass index is 30.26 kg/(m^2). GEN: Well nourished, well developed, male in no acute distress HEENT: normal for age  Neck: no JVD, no carotid bruit, no masses Cardiac: RRR; 2/6 murmur, no rubs, or gallops Respiratory:  clear to auscultation bilaterally, normal work of breathing GI: soft, nontender, nondistended, +  BS MS: no deformity or atrophy; no edema; distal pulses are 2+ in all 4 extremities  Skin: warm and dry, no rash Neuro:  Strength and sensation are intact Psych: euthymic mood, full affect   EKG:  EKG is not ordered today.   Recent Labs: 02/25/2015: B Natriuretic Peptide 128.7*; Magnesium 1.8; TSH 2.125 02/26/2015: ALT 11* 03/01/2015: BUN 21*; Creatinine, Ser 1.19; Hemoglobin 12.3*; Platelets 195; Potassium 3.7; Sodium 139    Lipid Panel    Component Value Date/Time   CHOL 189 02/26/2015 0939   TRIG 61 02/26/2015 0939   HDL 65 02/26/2015 0939   CHOLHDL 2.9 02/26/2015 0939   VLDL 12 02/26/2015 0939   LDLCALC 112* 02/26/2015 0939     Wt Readings from Last 3 Encounters:  03/11/15 205 lb (92.987 kg)  03/01/15 198 lb 6.4 oz (89.994 kg)  03/20/13 194 lb 7.1 oz (88.2 kg)     Other studies Reviewed: Additional studies/ records that were reviewed today include: Hospital records and testing.  ASSESSMENT AND PLAN:  1.  Atrial tach: He has had a few palpitations, but not prolonged. A Holter monitor will help determine his arrhythmia burden. His heart rate is low but he is asymptomatic. Continue the amio taper as ordered. No BB change unless he gets symptoms from the bradycardia.  2. HTN: His BP is consistently elevated. I wanted to add amlodipine 5 mg to meds but his wife feels he eats too much salt (no volume overload on exam) and eats poorly otherwise, Give it a month and if BP does not improve, add the Norvasc 5 mg at that time.  Current medicines are reviewed at length with the patient today.  The patient does not have concerns regarding medicines.  The following changes have been made:  no change  Labs/ tests ordered today include:  No orders of the defined types were placed in this encounter.     Disposition:   FU with Dr Claiborne Mcknight in 3 months  Signed, Robert Mcknight  03/11/2015 5:03 PM    [facility] [address], [place], [place]   [address] Phone: [phone]; Fax: [phone]

## 2015-03-11 NOTE — Patient Instructions (Signed)
Your physician has recommended you make the following change in your medication: continue to decrease the amiodarone as directed.  Your physician has recommended that you wear a holter monitor. Holter monitors are medical devices that record the heart's electrical activity. Doctors most often use these monitors to diagnose arrhythmias. Arrhythmias are problems with the speed or rhythm of the heartbeat. The monitor is a small, portable device. You can wear one while you do your normal daily activities. This is usually used to diagnose what is causing palpitations/syncope (passing out). This will be worn for 48 hours. You will return the monitor to our office on Monday February 20th.  Your physician recommends that you schedule a follow-up appointment in: 3 months with Dr Claiborne Billings.

## 2015-03-12 ENCOUNTER — Ambulatory Visit (INDEPENDENT_AMBULATORY_CARE_PROVIDER_SITE_OTHER): Payer: Medicare Other | Admitting: Urology

## 2015-03-12 DIAGNOSIS — N3040 Irradiation cystitis without hematuria: Secondary | ICD-10-CM | POA: Diagnosis not present

## 2015-03-12 DIAGNOSIS — Z8546 Personal history of malignant neoplasm of prostate: Secondary | ICD-10-CM | POA: Diagnosis not present

## 2015-03-12 DIAGNOSIS — N3941 Urge incontinence: Secondary | ICD-10-CM | POA: Diagnosis not present

## 2015-03-22 ENCOUNTER — Telehealth: Payer: Self-pay | Admitting: Physician Assistant

## 2015-03-22 MED ORDER — AMLODIPINE BESYLATE 5 MG PO TABS: 5.0000 mg | ORAL_TABLET | Freq: Every day | ORAL | Status: DC

## 2015-03-22 NOTE — Telephone Encounter (Signed)
Yes, that is exactly right. He may need to be on 10 mg /day.  If he does not have an upcoming appointment and is doing well, just have him call in some BPs in a month or so. Can increase the amlodipine to 10 mg daily if still too high. Thank you

## 2015-03-22 NOTE — Telephone Encounter (Signed)
Reviewed patient's last office note  AMLODIPINE 5 MG to be started   Blood pressure reading 2/23  181/85  66,  146/79 56      178/84 56, 151/77 59     167/82   60 ,133/71 60     174/89  62, 161/79  56 Patient aware will e-sent medication .     Will send to [facility]

## 2015-03-22 NOTE — Telephone Encounter (Signed)
Pt have decided he will take the new blood pressure medicine that Suanne Marker wanted to prescribe.[phone].

## 2015-03-25 NOTE — Telephone Encounter (Signed)
PATIENT AWARE TO CALL BACK WITH READINGS  PATIENT STATES READING HAS NOT CHANGE MUCH SINCE STARTING THEM LAST WEEK  RN INFORMED PATIENT TO CONTINUE FOR ANOTHER FEW WEEKS AN CALL BACK  VERBALIZED UNDERSTANDING.

## 2015-03-29 ENCOUNTER — Telehealth: Payer: Self-pay | Admitting: Cardiovascular Disease

## 2015-03-29 ENCOUNTER — Encounter: Payer: Medicare Other | Admitting: Cardiology

## 2015-03-29 MED ORDER — METOPROLOL TARTRATE 50 MG PO TABS: 50.0000 mg | ORAL_TABLET | Freq: Two times a day (BID) | ORAL | Status: DC

## 2015-03-29 MED ORDER — AMIODARONE HCL 200 MG PO TABS: 200.0000 mg | ORAL_TABLET | Freq: Every day | ORAL | Status: DC

## 2015-03-29 NOTE — Telephone Encounter (Signed)
Meds refilled, pt given instruction on f/u visit and time.

## 2015-03-29 NOTE — Telephone Encounter (Signed)
New message      Pt saw Suanne Marker on 03-11-15.  Please call and give holter monitor results.  Should pt keep his 03-31-15 appt or was that replaced with Rhonda's visit?   *STAT* If patient is at the pharmacy, call can be transferred to refill team.   1. Which medications need to be refilled? (please list name of each medication and dose if known) amiodarone and metoprolol 2. Which pharmacy/location (including street and city if local pharmacy) is medication to be sent to? [email or web address]  [email or web address] 3. Do they need a 30 day or 90 day supply? 30 day supply

## 2015-03-31 ENCOUNTER — Encounter: Payer: Self-pay | Admitting: Cardiology

## 2015-03-31 ENCOUNTER — Ambulatory Visit (INDEPENDENT_AMBULATORY_CARE_PROVIDER_SITE_OTHER): Payer: Medicare Other | Admitting: Cardiology

## 2015-03-31 VITALS — BP 192/90 | HR 60 | Ht 69.0 in | Wt 199.2 lb

## 2015-03-31 DIAGNOSIS — Z79899 Other long term (current) drug therapy: Secondary | ICD-10-CM

## 2015-03-31 DIAGNOSIS — I10 Essential (primary) hypertension: Secondary | ICD-10-CM | POA: Diagnosis not present

## 2015-03-31 DIAGNOSIS — I471 Supraventricular tachycardia, unspecified: Secondary | ICD-10-CM

## 2015-03-31 MED ORDER — LISINOPRIL 20 MG PO TABS: 20.0000 mg | ORAL_TABLET | Freq: Every day | ORAL | Status: DC

## 2015-03-31 MED ORDER — AMLODIPINE BESYLATE 10 MG PO TABS: 10.0000 mg | ORAL_TABLET | Freq: Every day | ORAL | Status: DC

## 2015-03-31 NOTE — Progress Notes (Signed)
Electrophysiology Office Note   Date:  03/31/2015   ID:  Robert Mcknight, DOB 05-21-1933, MRN [id number]  PCP:  Robert Lopes, MD  Cardiologist:  Robert Mcknight Primary Electrophysiologist:  Robert Mcknight Robert Leeds, MD    Chief Complaint  Patient presents with  . Hospitalization Follow-up     History of Present Illness: Robert Mcknight is a 82 y.o. male who presents today for electrophysiology evaluation.   He was admitted on 2/6 for an atrial arrhythmia.  He was discharged on amiodarone and beta blockers.  He also had chest pain with exertion, found to have normal coronary arteries.  Since discharge,  He is felt well without complaint. He has no chest pain, shortness of breath, PND, orthopnea, or palpitations. His blood pressure is elevated today. He has no headaches , or blurry vision.   Today, he denies symptoms of palpitations, chest pain, shortness of breath, orthopnea, PND, lower extremity edema, claudication, dizziness, presyncope, syncope, bleeding, or neurologic sequela. The patient is tolerating medications without difficulties and is otherwise without complaint today.    Past Medical History  Diagnosis Date  . Diabetes mellitus   . Hypertension   . Seasonal allergies   . GERD (gastroesophageal reflux disease)   . Prostate cancer ([name]) 2011  . Heart murmur   . Sleep apnea    Past Surgical History  Procedure Laterality Date  . Inguinal hernia repair  1992    left  . Transurethral resection of prostate    . Flexible sigmoidoscopy N/A 03/23/2013    Procedure: FLEXIBLE SIGMOIDOSCOPY;  Surgeon: Robert Beams, MD;  Location: [facility];  Service: Endoscopy;  Laterality: N/A;  . Nasal sinus surgery  02/01/15  . Cardiac catheterization N/A 02/26/2015    Procedure: Left Heart Cath and Coronary Angiography;  Surgeon: Robert Crome, MD;  Location: [facility];  Service: Cardiovascular;  Laterality: N/A;     Current Outpatient Prescriptions  Medication Sig Dispense Refill  .  amiodarone (PACERONE) 200 MG tablet Take 1 tablet (200 mg total) by mouth daily. 30 tablet 1  . amLODipine (NORVASC) 5 MG tablet Take 1 tablet (5 mg total) by mouth daily. 30 tablet 6  . aspirin EC 81 MG EC tablet Take 1 tablet (81 mg total) by mouth daily. 30 tablet 0  . atorvastatin (LIPITOR) 40 MG tablet Take 1 tablet (40 mg total) by mouth daily at 6 PM. 30 tablet 1  . Bepotastine Besilate (BEPREVE) 1.5 % SOLN Place 1 drop into both eyes 2 (two) times daily as needed. Vision not clear    . Calcium Carb-Cholecalciferol (CALCIUM CARBONATE-VITAMIN D3 PO) Take 1 tablet by mouth 2 (two) times daily. Calcium 600 mg and Vitamin  D 800 units    . metFORMIN (GLUMETZA) 500 MG (MOD) 24 hr tablet Take 500 mg by mouth 2 (two) times daily with a meal.    . metoprolol (LOPRESSOR) 50 MG tablet Take 1 tablet (50 mg total) by mouth 2 (two) times daily. 60 tablet 1  . Omega-3 Fatty Acids (FISH OIL) 1000 MG CPDR Take 2 capsules by mouth 2 (two) times daily.     [name] Kitchen omeprazole (PRILOSEC) 20 MG capsule Take 20 mg by mouth daily.      . vitamin E (VITAMIN E) 1000 UNIT capsule Take 1,000 Units by mouth daily.     No current facility-administered medications for this visit.    Allergies:   Review of patient's allergies indicates no known allergies.   Social History:  The patient  reports that he has never smoked. He has never used smokeless tobacco. He reports that he does not drink alcohol or use illicit drugs.   Family History:  The patient's family history includes Colon cancer in his brother; Colon cancer (age of onset: 18) in his brother. There is no history of Stomach cancer.    ROS:  Please see the history of present illness.   Otherwise, review of systems is positive for cough.   All other systems are reviewed and negative.    PHYSICAL EXAM: VS:  BP 192/90 mmHg  Pulse 60  Ht 5\' 9"  (1.753 m)  Wt 199 lb 3.2 oz (90.357 kg)  BMI 29.40 kg/m2 , BMI Body mass index is 29.4 kg/(m^2). GEN: Well nourished,  well developed, in no acute distress HEENT: normal Neck: no JVD, carotid bruits, or masses Cardiac: RRR; no murmurs, rubs, or gallops,no edema  Respiratory:  clear to auscultation bilaterally, normal work of breathing GI: soft, nontender, nondistended, + BS MS: no deformity or atrophy Skin: warm and dry Neuro:  Strength and sensation are intact Psych: euthymic mood, full affect  EKG:  EKG is ordered today. The ekg ordered today shows sinus rhythm, 1 degree AV block  Recent Labs: 02/25/2015: B Natriuretic Peptide 128.7*; Magnesium 1.8; TSH 2.125 02/26/2015: ALT 11* 03/01/2015: BUN 21*; Creatinine, Ser 1.19; Hemoglobin 12.3*; Platelets 195; Potassium 3.7; Sodium 139    Lipid Panel     Component Value Date/Time   CHOL 189 02/26/2015 0939   TRIG 61 02/26/2015 0939   HDL 65 02/26/2015 0939   CHOLHDL 2.9 02/26/2015 0939   VLDL 12 02/26/2015 0939   LDLCALC 112* 02/26/2015 0939     Wt Readings from Last 3 Encounters:  03/31/15 199 lb 3.2 oz (90.357 kg)  03/11/15 205 lb (92.987 kg)  03/01/15 198 lb 6.4 oz (89.994 kg)      Other studies Reviewed: Additional studies/ records that were reviewed today include: Cardiac cath 02/26/15, TTE 02/26/15  Review of the above records today demonstrates:   Widely patent and essentially normal coronary arteries.  Normal left ventricular systolic function with estimated ejection fraction of 50%. Normal left ventricular hemodynamics.  - Left ventricle: The cavity size was normal. Wall thickness was  increased in a pattern of mild LVH. Systolic function was normal.  The estimated ejection fraction was in the range of 60% to 65%.  Wall motion was normal; there were no regional wall motion  abnormalities. Doppler parameters are consistent with abnormal  left ventricular relaxation (grade 1 diastolic dysfunction). - Aortic valve: There was mild stenosis. Valve area (VTI): 1.43  cm^2. Valve area (Vmax): 1.41 cm^2. Valve area (Vmean): 1.28   cm^2.  ASSESSMENT AND PLAN:  1.  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  Discussed possible treatments including ablation and medical management with continued amiodarone.  Discussed risks and benefits of ablation including bleeding, infection, tamponade, pneumothorax.  Patient understands the risks and has agreed to the procedure.   We Houa Nie schedule the procedure for a month and a half, as he has been on amiodarone and Trinidi Toppins take this long to washout.  2.  Hypertension:  Blood pressure is quite elevated today. He is on amlodipine 5 mg. We'll double that to 10 mg and and 20 mg of lisinopril. He Devanta Daniel come back in a week for a blood pressure check as well as a metabolic check his kidneys.   Current medicines are reviewed at length with the patient  today.   The patient does not have concerns regarding his medicines.  The following changes were made today:  Norvasc 10 mg, lisinopril 20 mg  Labs/ tests ordered today include:  No orders of the defined types were placed in this encounter.     Disposition:   FU with Tzipporah Nagorski 1 months  Signed, Ariza Evans Robert Leeds, MD  03/31/2015 4:05 PM     [address] [place] [address] [phone] (office) [phone] (fax)

## 2015-03-31 NOTE — Patient Instructions (Signed)
Medication Instructions:  Your physician has recommended you make the following change in your medication: 1) INCREASE Amlodipine to 10 mg daily 2) START Lisinopril 20 mg daily  Labwork: Your physician recommends that you return for lab work in: one week for BMET  Testing/Procedures: Your physician has recommended that you have an SVT ablation on 05/03/2015. Catheter ablation is a medical procedure used to treat some cardiac arrhythmias (irregular heartbeats). During catheter ablation, a long, thin, flexible tube is put into a blood vessel in your groin (upper thigh), or neck. This tube is called an ablation catheter. It is then guided to your heart through the blood vessel. Radio frequency waves destroy small areas of heart tissue where abnormal heartbeats may cause an arrhythmia to start.   We will review instructions at next month's follow up visit.  Follow-Up: Your physician recommends that you schedule a follow-up appointment in: 1 week for a blood pressure check.  Your physician recommends that you schedule a follow-up appointment in: 1 month with Dr. Curt Bears.  If you need a refill on your cardiac medications before your next appointment, please call your pharmacy.  Thank you for choosing [facility]!!   Trinidad Curet, RN [phone]   Any Other Special Instructions Will Be Listed Below (If Applicable).  Cardiac Ablation Cardiac ablation is a procedure to disable a small amount of heart tissue in very specific places. The heart has many electrical connections. Sometimes these connections are abnormal and can cause the heart to beat very fast or irregularly. By disabling some of the problem areas, heart rhythm can be improved or made normal. Ablation is done for people who:   Have Wolff-Parkinson-White syndrome.   Have other fast heart rhythms (tachycardia).   Have taken medicines for an abnormal heart rhythm (arrhythmia) that resulted in:   No success.   Side  effects.   May have a high-risk heartbeat that could result in death.  LET [facility] CARE PROVIDER KNOW ABOUT:   Any allergies you have or any previous reactions you have had to X-ray dye, food (such as seafood), medicine, or tape.   All medicines you are taking, including vitamins, herbs, eye drops, creams, and over-the-counter medicines.   Previous problems you or members of your family have had with the use of anesthetics.   Any blood disorders you have.   Previous surgeries or procedures (such as a kidney transplant) you have had.   Medical conditions you have (such as kidney failure).  RISKS AND COMPLICATIONS Generally, cardiac ablation is a safe procedure. However, problems can occur and include:   Increased risk of cancer. Depending on how long it takes to do the ablation, the dose of radiation can be high.  Bruising and bleeding where a thin, flexible tube (catheter) was inserted during the procedure.   Bleeding into the chest, especially into the sac that surrounds the heart (serious).  Need for a permanent pacemaker if the normal electrical system is damaged.   The procedure may not be fully effective, and this may not be recognized for months. Repeat ablation procedures are sometimes required. BEFORE THE PROCEDURE   Follow any instructions from your health care provider regarding eating and drinking before the procedure.   Take your medicines as directed at regular times with water, unless instructed otherwise by your health care provider. If you are taking diabetes medicine, including insulin, ask how you are to take it and if there are any special instructions you should follow. It is common  to adjust insulin dosing the day of the ablation.  PROCEDURE  An ablation is usually performed in a catheterization laboratory with the guidance of fluoroscopy. Fluoroscopy is a type of X-ray that helps your health care provider see images of your heart during the  procedure.   An ablation is a minimally invasive procedure. This means a small cut (incision) is made in either your neck or groin. Your health care provider will decide where to make the incision based on your medical history and physical exam.  An IV tube will be started before the procedure begins. You will be given an anesthetic or medicine to help you relax (sedative).  The skin on your neck or groin will be numbed. A needle will be inserted into a large vein in your neck or groin and catheters will be threaded to your heart.  A special dye that shows up on fluoroscopy pictures may be injected through the catheter. The dye helps your health care provider see the area of the heart that needs treatment.  The catheter has electrodes on the tip. When the area of heart tissue that is causing the arrhythmia is found, the catheter tip will send an electrical current to the area and "scar" the tissue. Three types of energy can be used to ablate the heart tissue:   Heat (radiofrequency energy).   Laser energy.   Extreme cold (cryoablation).   When the area of the heart has been ablated, the catheter will be taken out. Pressure will be held on the insertion site. This will help the insertion site clot and keep it from bleeding. A bandage will be placed on the insertion site.  AFTER THE PROCEDURE   After the procedure, you will be taken to a recovery area where your vital signs (blood pressure, heart rate, and breathing) will be monitored. The insertion site will also be monitored for bleeding.   You will need to lie still for 4-6 hours. This is to ensure you do not bleed from the catheter insertion site.    This information is not intended to replace advice given to you by your health care provider. Make sure you discuss any questions you have with your health care provider.   Document Released: 05/28/2008 Document Revised: 01/30/2014 Document Reviewed: 06/03/2012 Elsevier Interactive  Patient Education [employer].

## 2015-04-09 ENCOUNTER — Ambulatory Visit (INDEPENDENT_AMBULATORY_CARE_PROVIDER_SITE_OTHER): Payer: Medicare Other | Admitting: Pharmacist

## 2015-04-09 ENCOUNTER — Other Ambulatory Visit (INDEPENDENT_AMBULATORY_CARE_PROVIDER_SITE_OTHER): Payer: Medicare Other | Admitting: *Deleted

## 2015-04-09 VITALS — BP 180/80

## 2015-04-09 DIAGNOSIS — I10 Essential (primary) hypertension: Secondary | ICD-10-CM | POA: Diagnosis not present

## 2015-04-09 DIAGNOSIS — Z79899 Other long term (current) drug therapy: Secondary | ICD-10-CM

## 2015-04-09 LAB — BASIC METABOLIC PANEL
BUN: 13 mg/dL (ref 7–25)
CO2: 24 mmol/L (ref 20–31)
Calcium: 9.5 mg/dL (ref 8.6–10.3)
Chloride: 104 mmol/L (ref 98–110)
Creat: 1.08 mg/dL (ref 0.70–1.11)
GLUCOSE: 123 mg/dL — AB (ref 65–99)
POTASSIUM: 3.9 mmol/L (ref 3.5–5.3)
SODIUM: 138 mmol/L (ref 135–146)

## 2015-04-09 NOTE — Patient Instructions (Signed)
Continue to check your blood pressure every day.  You want your numbers to be <150/90.   If you blood pressure is > 150/90 most days, please call Gay Filler at [phone]  Switch to decaffeinated coffee and try not to use any extra salt on your food.    We will call you on Monday with your lab results.

## 2015-04-12 NOTE — Progress Notes (Signed)
HPI: Robert Mcknight is a 82 y.o. male referred by Dr. Curt Bears to HTN clinic.  He was seen by Dr. Curt Bears on 03/31/15 for evaluation for SVT ablation.  BP at that visit was elevated at 192/90.  He was taking metoprolol 50mg  BID and amlodipine 5mg  daily.  Dr. Curt Bears instructed him to increase amlodipine to 10mg  daily and added lisinopril 20mg  daily.  Pt is here today for follow up.  His PMH is significant for HTN, DM and SVT.  He reports no problems with the medication changes.   Current HTN meds:  Amlodipine 10mg  daily Lisinopril 20mg  daily Metoprolol 50 mg BID   BP goal: <150/90 mmHg  Diet:  Pt does admit to drinking 1 cup of caffinated coffee each day.  He does add salt to his food at the table.  Wife states she does try to cook with Ms. Dash rather than salt.   Home BP readings:  Pt does check his BP at home in the AM and PM.  He did not bring his cuff in for verification today.  His readings prior to Dr. Curt Bears appointment had been consistently in the 160s and 170s.  With the medication changes, he is consistently in the 150s with some as low as [id number] systolic.  HR is in the low 60s.    Wt Readings from Last 3 Encounters:  03/31/15 199 lb 3.2 oz (90.357 kg)  03/11/15 205 lb (92.987 kg)  03/01/15 198 lb 6.4 oz (89.994 kg)   BP Readings from Last 3 Encounters:  04/09/15 180/80  03/31/15 192/90  03/11/15 160/80   Pulse Readings from Last 3 Encounters:  03/31/15 60  03/11/15 60  03/01/15 56    Renal function: Estimated Creatinine Clearance: 60.6 mL/min (by C-G formula based on Cr of 1.08).  Past Medical History  Diagnosis Date  . Diabetes mellitus   . Hypertension   . Seasonal allergies   . GERD (gastroesophageal reflux disease)   . Prostate cancer ([place]) 2011  . Heart murmur   . Sleep apnea     Current Outpatient Prescriptions on File Prior to Visit  Medication Sig Dispense Refill  . amLODipine (NORVASC) 10 MG tablet Take 1 tablet (10 mg total) by mouth daily. 30  tablet 6  . aspirin EC 81 MG EC tablet Take 1 tablet (81 mg total) by mouth daily. 30 tablet 0  . atorvastatin (LIPITOR) 40 MG tablet Take 1 tablet (40 mg total) by mouth daily at 6 PM. 30 tablet 1  . Bepotastine Besilate (BEPREVE) 1.5 % SOLN Place 1 drop into both eyes 2 (two) times daily as needed. Vision not clear    . Calcium Carb-Cholecalciferol (CALCIUM CARBONATE-VITAMIN D3 PO) Take 1 tablet by mouth 2 (two) times daily. Calcium 600 mg and Vitamin  D 800 units    . lisinopril (PRINIVIL,ZESTRIL) 20 MG tablet Take 1 tablet (20 mg total) by mouth daily. 30 tablet 6  . metFORMIN (GLUMETZA) 500 MG (MOD) 24 hr tablet Take 500 mg by mouth 2 (two) times daily with a meal.    . metoprolol (LOPRESSOR) 50 MG tablet Take 1 tablet (50 mg total) by mouth 2 (two) times daily. 60 tablet 1  . Omega-3 Fatty Acids (FISH OIL) 1000 MG CPDR Take 2 capsules by mouth 2 (two) times daily.     [name] Kitchen omeprazole (PRILOSEC) 20 MG capsule Take 20 mg by mouth daily.      . vitamin E (VITAMIN E) 1000 UNIT capsule Take  1,000 Units by mouth daily.     No current facility-administered medications on file prior to visit.    No Known Allergies   Assessment/Plan: 1.  Hypertension- Pt's BP improved with the increase in amlodipine and addition of lisinopril.  His BP is elevated in the office but home readings have been much better.  Only needs ~5 mmHg reduction to reach goal of < 150.  Discussed lifestyle changes with patient, including decreasing caffeine and added salt.  Pt's wife states she will help with these changes.   Labs checked today for addition of ACE-I.   Have instructed patient to call if BP > 150 on a consistent basis.  He has follow up with Dr. Curt Bears within the next 3 weeks.  If BP remains elevated will need to increase lisinopril to 40mg  daily.

## 2015-04-13 NOTE — Progress Notes (Signed)
Dr. Curt Bears reviewed holter monitor results. Informed patient.

## 2015-04-21 ENCOUNTER — Encounter: Payer: Self-pay | Admitting: Cardiology

## 2015-04-26 ENCOUNTER — Telehealth: Payer: Self-pay | Admitting: Cardiology

## 2015-04-26 NOTE — Telephone Encounter (Signed)
Left message to call back.  Informed that I would be working him in on Thursday's schedule and discussing time w/ Dr. Curt Bears tomorrow. Patient understands I will call him tomorrow after speaking w/ Camnitz.

## 2015-04-26 NOTE — Telephone Encounter (Signed)
NEW MESSAGE   Pt is calling to speak to rn about rescheduling his appt 04/27/15 that was canceled   I offered 05/05/15 and he declined because he states he is stupose to see Dr. Before 05/03/15 surgery

## 2015-04-27 ENCOUNTER — Ambulatory Visit: Payer: Medicare Other | Admitting: Cardiology

## 2015-04-27 NOTE — Telephone Encounter (Signed)
Patient scheduled to see Dr. Curt Bears, Thursday 4/6 at 8:45am Patient agreeable to plan.

## 2015-04-28 NOTE — Progress Notes (Signed)
Electrophysiology Office Note   Date:  04/29/2015   ID:  Robert Mcknight, DOB [birth year], MRN [id number]  PCP:  Donnajean Lopes, MD  Cardiologist:  Tamala Julian Primary Electrophysiologist:  Miamarie Moll Meredith Leeds, MD    Chief Complaint  Patient presents with  . Palpitations     History of Present Illness: Robert Mcknight is a 82 y.o. male who presents today for electrophysiology evaluation.   He was admitted on 2/6 for an atrial arrhythmia.  He was discharged on amiodarone and beta blockers.  He also had chest pain with exertion, found to have normal coronary arteries.  Since discharge,  He is felt well without complaint. He has no chest pain, shortness of breath, PND, orthopnea, or palpitations. He does say that he had a funny feeling in his left chest last night when he was laying down to go to sleep. He has not had any pain with exertion. He mowed the lawn yesterday and did not have any discomfort with that. He feels that it might be chest wall related.  Today, he denies symptoms of palpitations, chest pain, shortness of breath, orthopnea, PND, lower extremity edema, claudication, dizziness, presyncope, syncope, bleeding, or neurologic sequela. The patient is tolerating medications without difficulties and is otherwise without complaint today.    Past Medical History  Diagnosis Date  . Diabetes mellitus   . Hypertension   . Seasonal allergies   . GERD (gastroesophageal reflux disease)   . Prostate cancer ([place]) 2011  . Heart murmur   . Sleep apnea    Past Surgical History  Procedure Laterality Date  . Inguinal hernia repair  1992    left  . Transurethral resection of prostate    . Flexible sigmoidoscopy N/A 03/23/2013    Procedure: FLEXIBLE SIGMOIDOSCOPY;  Surgeon: Beryle Beams, MD;  Location: [facility];  Service: Endoscopy;  Laterality: N/A;  . Nasal sinus surgery  02/01/15  . Cardiac catheterization N/A 02/26/2015    Procedure: Left Heart Cath and Coronary Angiography;  Surgeon:  Belva Crome, MD;  Location: [facility];  Service: Cardiovascular;  Laterality: N/A;     Current Outpatient Prescriptions  Medication Sig Dispense Refill  . amLODipine (NORVASC) 10 MG tablet Take 1 tablet (10 mg total) by mouth daily. 30 tablet 6  . aspirin EC 81 MG EC tablet Take 1 tablet (81 mg total) by mouth daily. 30 tablet 0  . atorvastatin (LIPITOR) 40 MG tablet Take 1 tablet (40 mg total) by mouth daily at 6 PM. 30 tablet 11  . Bepotastine Besilate (BEPREVE) 1.5 % SOLN Place 1 drop into both eyes 2 (two) times daily as needed. Vision not clear    . Calcium Carb-Cholecalciferol (CALCIUM CARBONATE-VITAMIN D3 PO) Take 1 tablet by mouth 2 (two) times daily. Calcium 600 mg and Vitamin  D 800 units    . cetirizine (ZYRTEC) 10 MG tablet Take 10 mg by mouth daily.    [name] Kitchen lisinopril (PRINIVIL,ZESTRIL) 40 MG tablet Take 1 tablet (40 mg total) by mouth daily. 30 tablet 3  . metFORMIN (GLUMETZA) 500 MG (MOD) 24 hr tablet Take 500 mg by mouth 2 (two) times daily with a meal.    . Omega-3 Fatty Acids (FISH OIL) 1000 MG CPDR Take 2,000 mg by mouth 2 (two) times daily.     [name] Kitchen omeprazole (PRILOSEC) 20 MG capsule Take 20 mg by mouth 2 (two) times daily before a meal.     . vitamin E (VITAMIN E) 1000 UNIT capsule  Take 1,000 Units by mouth daily.    . carvedilol (COREG) 12.5 MG tablet Take 1 tablet (12.5 mg total) by mouth 2 (two) times daily. 60 tablet 3   No current facility-administered medications for this visit.    Allergies:   Pollen extract   Social History:  The patient  reports that he has never smoked. He has never used smokeless tobacco. He reports that he does not drink alcohol or use illicit drugs.   Family History:  The patient's family history includes Colon cancer in his brother; Colon cancer (age of onset: 17) in his brother. There is no history of Stomach cancer.    ROS:  Please see the history of present illness.   Otherwise, review of systems is positive for none.   All  other systems are reviewed and negative.    PHYSICAL EXAM: VS:  BP 170/84 mmHg  Pulse 64  Ht 5\' 9"  (1.753 m)  Wt 194 lb 6.4 oz (88.179 kg)  BMI 28.69 kg/m2 , BMI Body mass index is 28.69 kg/(m^2). GEN: Well nourished, well developed, in no acute distress HEENT: normal Neck: no JVD, carotid bruits, or masses Cardiac: RRR; no murmurs, rubs, or gallops,no edema  Respiratory:  clear to auscultation bilaterally, normal work of breathing GI: soft, nontender, nondistended, + BS MS: no deformity or atrophy Skin: warm and dry Neuro:  Strength and sensation are intact Psych: euthymic mood, full affect  EKG:  EKG is not ordered today.  Recent Labs: 02/25/2015: B Natriuretic Peptide 128.7*; Magnesium 1.8; TSH 2.125 02/26/2015: ALT 11* 03/01/2015: Hemoglobin 12.3*; Platelets 195 04/09/2015: BUN 13; Creat 1.08; Potassium 3.9; Sodium 138    Lipid Panel     Component Value Date/Time   CHOL 189 02/26/2015 0939   TRIG 61 02/26/2015 0939   HDL 65 02/26/2015 0939   CHOLHDL 2.9 02/26/2015 0939   VLDL 12 02/26/2015 0939   LDLCALC 112* 02/26/2015 0939     Wt Readings from Last 3 Encounters:  04/29/15 194 lb 6.4 oz (88.179 kg)  03/31/15 199 lb 3.2 oz (90.357 kg)  03/11/15 205 lb (92.987 kg)      Other studies Reviewed: Additional studies/ records that were reviewed today include: Cardiac cath 02/26/15, TTE 02/26/15  Review of the above records today demonstrates:   Widely patent and essentially normal coronary arteries.  Normal left ventricular systolic function with estimated ejection fraction of 50%. Normal left ventricular hemodynamics.  - Left ventricle: The cavity size was normal. Wall thickness was  increased in a pattern of mild LVH. Systolic function was normal.  The estimated ejection fraction was in the range of 60% to 65%.  Wall motion was normal; there were no regional wall motion  abnormalities. Doppler parameters are consistent with abnormal  left ventricular relaxation  (grade 1 diastolic dysfunction). - Aortic valve: There was mild stenosis. Valve area (VTI): 1.43  cm^2. Valve area (Vmax): 1.41 cm^2. Valve area (Vmean): 1.28  cm^2.  ASSESSMENT AND PLAN:  1.  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 He says that he would like to try ablation at this time. I have told him that if we do not get the rhythm started, the medical management may be a good option for him. Discussed risks and benefits of ablation including bleeding, Tamponade, heart block, stroke, among others.  Patient understands the risks and has agreed to the procedure.     2.  Hypertension:  Blood pressure is quite elevated today. We'll increase his lisinopril to 40 mg and switch his metoprolol to Coreg 12.5 mg   Current medicines are reviewed at length with the patient today.   The patient does not have concerns regarding his medicines.  The following changes were made today:  Switch metoprolol to Coreg, increase lisinopril to 40 mg  Labs/ tests ordered today include:  No orders of the defined types were placed in this encounter.     Disposition:   FU with Verlie Liotta post ablation  Signed, Tamy Accardo Meredith Leeds, MD  04/29/2015 8:45 AM     [facility] [address] [place] [address] [phone] (office) [phone] (fax)

## 2015-04-29 ENCOUNTER — Ambulatory Visit (INDEPENDENT_AMBULATORY_CARE_PROVIDER_SITE_OTHER): Payer: Medicare Other | Admitting: Cardiology

## 2015-04-29 ENCOUNTER — Encounter: Payer: Self-pay | Admitting: *Deleted

## 2015-04-29 ENCOUNTER — Encounter: Payer: Self-pay | Admitting: Cardiology

## 2015-04-29 VITALS — BP 170/84 | HR 64 | Ht 69.0 in | Wt 194.4 lb

## 2015-04-29 DIAGNOSIS — I471 Supraventricular tachycardia, unspecified: Secondary | ICD-10-CM

## 2015-04-29 DIAGNOSIS — Z01812 Encounter for preprocedural laboratory examination: Secondary | ICD-10-CM

## 2015-04-29 LAB — CBC WITH DIFFERENTIAL/PLATELET
BASOS ABS: 72 {cells}/uL (ref 0–200)
Basophils Relative: 1 %
Eosinophils Absolute: 360 cells/uL (ref 15–500)
Eosinophils Relative: 5 %
HEMATOCRIT: 38.3 % — AB (ref 38.5–50.0)
Hemoglobin: 12.4 g/dL — ABNORMAL LOW (ref 13.2–17.1)
LYMPHS PCT: 29 %
Lymphs Abs: 2088 cells/uL (ref 850–3900)
MCH: 26.3 pg — AB (ref 27.0–33.0)
MCHC: 32.4 g/dL (ref 32.0–36.0)
MCV: 81.3 fL (ref 80.0–100.0)
MONO ABS: 720 {cells}/uL (ref 200–950)
MPV: 10.8 fL (ref 7.5–12.5)
Monocytes Relative: 10 %
NEUTROS PCT: 55 %
Neutro Abs: 3960 cells/uL (ref 1500–7800)
Platelets: 248 10*3/uL (ref 140–400)
RBC: 4.71 MIL/uL (ref 4.20–5.80)
RDW: 17.7 % — AB (ref 11.0–15.0)
WBC: 7.2 10*3/uL (ref 3.8–10.8)

## 2015-04-29 LAB — BASIC METABOLIC PANEL
BUN: 16 mg/dL (ref 7–25)
CALCIUM: 9.3 mg/dL (ref 8.6–10.3)
CO2: 24 mmol/L (ref 20–31)
Chloride: 105 mmol/L (ref 98–110)
Creat: 1.03 mg/dL (ref 0.70–1.11)
GLUCOSE: 107 mg/dL — AB (ref 65–99)
POTASSIUM: 4 mmol/L (ref 3.5–5.3)
Sodium: 139 mmol/L (ref 135–146)

## 2015-04-29 MED ORDER — LISINOPRIL 40 MG PO TABS: 40.0000 mg | ORAL_TABLET | Freq: Every day | ORAL | Status: DC

## 2015-04-29 MED ORDER — ATORVASTATIN CALCIUM 40 MG PO TABS: 40.0000 mg | ORAL_TABLET | Freq: Every day | ORAL | Status: DC

## 2015-04-29 MED ORDER — CARVEDILOL 12.5 MG PO TABS: 12.5000 mg | ORAL_TABLET | Freq: Two times a day (BID) | ORAL | Status: DC

## 2015-04-29 NOTE — Patient Instructions (Addendum)
Medication Instructions:  Your physician has recommended you make the following change in your medication: 1) INCREASE Lisinopril to 40 mg daily 2) STOP Metoprolol 3) START Carvedilol 12.5 mg twice daily  Labwork: Pre procedure labs today: BMET & CBCD  Testing/Procedures: Your physician has recommended that you have an SVT ablation on 05/03/15. Catheter ablation is a medical procedure used to treat some cardiac arrhythmias (irregular heartbeats). During catheter ablation, a long, thin, flexible tube is put into a blood vessel in your groin (upper thigh), or neck. This tube is called an ablation catheter. It is then guided to your heart through the blood vessel. Radio frequency waves destroy small areas of heart tissue where abnormal heartbeats may cause an arrhythmia to start. Please see the instruction sheet given to you today.  Follow-Up: Your physician recommends that you schedule a follow-up appointment in: 4 weeks, after procedure on 05/03/15, with Dr. Curt Bears.  If you need a refill on your cardiac medications before your next appointment, please call your pharmacy.  Thank you for choosing [facility]!!   Trinidad Curet, RN [phone]    Carvedilol tablets What is this medicine? CARVEDILOL (KAR ve dil ol) is a beta-blocker. Beta-blockers reduce the workload on the heart and help it to beat more regularly. This medicine is used to treat high blood pressure and heart failure. This medicine may be used for other purposes; ask your health care provider or pharmacist if you have questions. What should I tell my health care provider before I take this medicine? They need to know if you have any of these conditions: -circulation problems -diabetes -history of heart attack or heart disease -liver disease -lung or breathing disease, like asthma or emphysema -pheochromocytoma -slow or irregular heartbeat -thyroid disease -an unusual or allergic reaction to carvedilol, other  beta-blockers, medicines, foods, dyes, or preservatives -pregnant or trying to get pregnant -breast-feeding How should I use this medicine? Take this medicine by mouth with a glass of water. Follow the directions on the prescription label. It is best to take the tablets with food. Take your doses at regular intervals. Do not take your medicine more often than directed. Do not stop taking except on the advice of your doctor or health care professional. Talk to your pediatrician regarding the use of this medicine in children. Special care may be needed. Overdosage: If you think you have taken too much of this medicine contact a poison control center or emergency room at once. NOTE: This medicine is only for you. Do not share this medicine with others. What if I miss a dose? If you miss a dose, take it as soon as you can. If it is almost time for your next dose, take only that dose. Do not take double or extra doses. What may interact with this medicine? This medicine may interact with the following medications: -certain medicines for blood pressure, heart disease, irregular heart beat -certain medicines for depression, like fluoxetine or paroxetine -certain medicines for diabetes, like glipizide or glyburide -cimetidine -clonidine -cyclosporine -digoxin -MAOIs like Carbex, Eldepryl, Marplan, Nardil, and Parnate -reserpine -rifampin This list may not describe all possible interactions. Give your health care provider a list of all the medicines, herbs, non-prescription drugs, or dietary supplements you use. Also tell them if you smoke, drink alcohol, or use illegal drugs. Some items may interact with your medicine. What should I watch for while using this medicine? Check your heart rate and blood pressure regularly while you are taking this medicine. Ask  your doctor or health care professional what your heart rate and blood pressure should be, and when you should contact him or her. Do not stop  taking this medicine suddenly. This could lead to serious heart-related effects. Contact your doctor or health care professional if you have difficulty breathing while taking this drug. Check your weight daily. Ask your doctor or health care professional when you should notify him/her of any weight gain. You may get drowsy or dizzy. Do not drive, use machinery, or do anything that requires mental alertness until you know how this medicine affects you. To reduce the risk of dizzy or fainting spells, do not sit or stand up quickly. Alcohol can make you more drowsy, and increase flushing and rapid heartbeats. Avoid alcoholic drinks. If you have diabetes, check your blood sugar as directed. Tell your doctor if you have changes in your blood sugar while you are taking this medicine. If you are going to have surgery, tell your doctor or health care professional that you are taking this medicine. What side effects may I notice from receiving this medicine? Side effects that you should report to your doctor or health care professional as soon as possible: -allergic reactions like skin rash, itching or hives, swelling of the face, lips, or tongue -breathing problems -dark urine -irregular heartbeat -swollen legs or ankles -vomiting -yellowing of the eyes or skin Side effects that usually do not require medical attention (report to your doctor or health care professional if they continue or are bothersome): -change in sex drive or performance -diarrhea -dry eyes (especially if wearing contact lenses) -dry, itching skin -headache -nausea -unusually tired This list may not describe all possible side effects. Call your doctor for medical advice about side effects. You may report side effects to FDA at 1-800-FDA-1088. Where should I keep my medicine? Keep out of the reach of children. Store at room temperature below 30 degrees C (86 degrees F). Protect from moisture. Keep container tightly closed. Throw  away any unused medicine after the expiration date. NOTE: This sheet is a summary. It may not cover all possible information. If you have questions about this medicine, talk to your doctor, pharmacist, or health care provider.    2016, [employer]vier/[employer] Standard. (2012-09-15 14:12:02)

## 2015-05-03 ENCOUNTER — Ambulatory Visit (HOSPITAL_COMMUNITY)
Admission: RE | Admit: 2015-05-03 | Discharge: 2015-05-03 | Disposition: A | Discharge status: Home / Self Care | Payer: Medicare Other | Source: Ambulatory Visit | Attending: Cardiology | Admitting: Cardiology

## 2015-05-03 ENCOUNTER — Encounter (HOSPITAL_COMMUNITY)
Admission: RE | Disposition: A | Discharge status: Home / Self Care | Payer: Self-pay | Source: Ambulatory Visit | Attending: Cardiology

## 2015-05-03 DIAGNOSIS — I471 Supraventricular tachycardia, unspecified: Secondary | ICD-10-CM | POA: Insufficient documentation

## 2015-05-03 HISTORY — PX: ELECTROPHYSIOLOGIC STUDY: SHX172A

## 2015-05-03 LAB — GLUCOSE, CAPILLARY
GLUCOSE-CAPILLARY: 108 mg/dL — AB (ref 65–99)
GLUCOSE-CAPILLARY: 103 mg/dL — AB (ref 65–99)

## 2015-05-03 SURGERY — A-FLUTTER/A-TACH/SVT ABLATION

## 2015-05-03 MED ORDER — CARVEDILOL 12.5 MG PO TABS
12.5000 mg | ORAL_TABLET | Freq: Two times a day (BID) | ORAL | Status: DC
  Administered 2015-05-03: 12.5 mg via ORAL
  Filled 2015-05-03 (×3): qty 1

## 2015-05-03 MED ORDER — FENTANYL CITRATE (PF) 100 MCG/2ML IJ SOLN
INTRAMUSCULAR | Status: AC
  Filled 2015-05-03: qty 2

## 2015-05-03 MED ORDER — AMLODIPINE BESYLATE 10 MG PO TABS
10.0000 mg | ORAL_TABLET | Freq: Every day | ORAL | Status: DC
  Administered 2015-05-03: 10 mg via ORAL
  Filled 2015-05-03 (×2): qty 1

## 2015-05-03 MED ORDER — MIDAZOLAM HCL 5 MG/5ML IJ SOLN
INTRAMUSCULAR | Status: DC | PRN
  Administered 2015-05-03: 1 mg via INTRAVENOUS
  Administered 2015-05-03: 2 mg via INTRAVENOUS

## 2015-05-03 MED ORDER — BUPIVACAINE HCL (PF) 0.25 % IJ SOLN
INTRAMUSCULAR | Status: AC
  Filled 2015-05-03: qty 60

## 2015-05-03 MED ORDER — SODIUM CHLORIDE 0.9 % IV SOLN
INTRAVENOUS | Status: DC | PRN
  Administered 2015-05-03: 50 mL/h via INTRAVENOUS

## 2015-05-03 MED ORDER — FENTANYL CITRATE (PF) 100 MCG/2ML IJ SOLN
INTRAMUSCULAR | Status: DC | PRN
  Administered 2015-05-03: 12.5 ug via INTRAVENOUS
  Administered 2015-05-03: 25 ug via INTRAVENOUS

## 2015-05-03 MED ORDER — MIDAZOLAM HCL 5 MG/5ML IJ SOLN
INTRAMUSCULAR | Status: AC
  Filled 2015-05-03: qty 5

## 2015-05-03 MED ORDER — ISOPROTERENOL HCL 0.2 MG/ML IJ SOLN
INTRAMUSCULAR | Status: AC
  Filled 2015-05-03: qty 5

## 2015-05-03 MED ORDER — ACETAMINOPHEN 325 MG PO TABS: 650.0000 mg | ORAL_TABLET | ORAL | Status: DC | PRN

## 2015-05-03 MED ORDER — LISINOPRIL 40 MG PO TABS
40.0000 mg | ORAL_TABLET | Freq: Every day | ORAL | Status: DC
  Administered 2015-05-03: 40 mg via ORAL
  Filled 2015-05-03 (×2): qty 1

## 2015-05-03 MED ORDER — PANTOPRAZOLE SODIUM 40 MG PO TBEC: 40.0000 mg | DELAYED_RELEASE_TABLET | Freq: Every day | ORAL | Status: DC

## 2015-05-03 MED ORDER — SODIUM CHLORIDE 0.9 % IV SOLN: 250.0000 mL | INTRAVENOUS | Status: DC | PRN

## 2015-05-03 MED ORDER — HEPARIN (PORCINE) IN NACL 2-0.9 UNIT/ML-% IJ SOLN
INTRAMUSCULAR | Status: AC
  Filled 2015-05-03: qty 500

## 2015-05-03 MED ORDER — ATORVASTATIN CALCIUM 40 MG PO TABS: 40.0000 mg | ORAL_TABLET | Freq: Every day | ORAL | Status: DC

## 2015-05-03 MED ORDER — SODIUM CHLORIDE 0.9 % IV SOLN
1.0000 mg | INTRAVENOUS | Status: DC | PRN
  Administered 2015-05-03: 2 ug/min via INTRAVENOUS

## 2015-05-03 MED ORDER — SODIUM CHLORIDE 0.9% FLUSH: 3.0000 mL | INTRAVENOUS | Status: DC | PRN

## 2015-05-03 MED ORDER — ASPIRIN EC 81 MG PO TBEC: 81.0000 mg | DELAYED_RELEASE_TABLET | Freq: Every day | ORAL | Status: DC

## 2015-05-03 MED ORDER — VITAMIN E 45 MG (100 UNIT) PO CAPS: 1000.0000 [IU] | ORAL_CAPSULE | Freq: Every day | ORAL | Status: DC

## 2015-05-03 MED ORDER — METFORMIN HCL ER 500 MG PO TB24: 500.0000 mg | ORAL_TABLET | Freq: Two times a day (BID) | ORAL | Status: DC

## 2015-05-03 MED ORDER — HEPARIN (PORCINE) IN NACL 2-0.9 UNIT/ML-% IJ SOLN
INTRAMUSCULAR | Status: DC | PRN
  Administered 2015-05-03: 08:00:00

## 2015-05-03 MED ORDER — ONDANSETRON HCL 4 MG/2ML IJ SOLN: 4.0000 mg | Freq: Four times a day (QID) | INTRAMUSCULAR | Status: DC | PRN

## 2015-05-03 MED ORDER — BUPIVACAINE HCL (PF) 0.25 % IJ SOLN
INTRAMUSCULAR | Status: DC | PRN
  Administered 2015-05-03: 52 mL

## 2015-05-03 MED ORDER — SODIUM CHLORIDE 0.9% FLUSH: 3.0000 mL | Freq: Two times a day (BID) | INTRAVENOUS | Status: DC

## 2015-05-03 SURGICAL SUPPLY — 11 items
BAG SNAP BAND KOVER 36X36 (MISCELLANEOUS) ×3 IMPLANT
CATH JOSEPHSON QUAD-ALLRED 6FR (CATHETERS) ×9 IMPLANT
CATH WEBSTER BI DIR CS D-F CRV (CATHETERS) ×2 IMPLANT
PACK EP LATEX FREE (CUSTOM PROCEDURE TRAY) ×3
PACK EP LF (CUSTOM PROCEDURE TRAY) ×1 IMPLANT
PAD DEFIB LIFELINK (PAD) ×3 IMPLANT
PATCH CARTO3 (PAD) ×2 IMPLANT
SHEATH PINNACLE 6F 10CM (SHEATH) ×4 IMPLANT
SHEATH PINNACLE 7F 10CM (SHEATH) ×2 IMPLANT
SHEATH PINNACLE 8F 10CM (SHEATH) ×2 IMPLANT
SHIELD RADPAD SCOOP 12X17 (MISCELLANEOUS) ×3 IMPLANT

## 2015-05-03 NOTE — Progress Notes (Signed)
Sheath removal, right Femoral vein, 7 Fr and 8 FR removed.  10 min manual pressure held.  Site unremarkable, distal pulses palpable, bilateral.  Level 0.  Post instructions given to patient. Left Femoral vein, 6 FR x2 removed.  10 min manual pressure .  Bedrest begins at 10:30.  Post instructions repeated. Level 0

## 2015-05-03 NOTE — Progress Notes (Signed)
The patient is seen by Dr. Curt Bears post-procedure.  Activity restrictions discussed, procedure sites stable.  OK to discharge once bed rest is completed and procedure sites stable after ambulation.  Follow-up visit has been arranged.  Tommye Standard, PA-C  Allegra Lai, MD

## 2015-05-03 NOTE — Discharge Instructions (Signed)
No driving for 4 days. No lifting over 5 lbs for 1 week. No vigorous or sexual activity for 1 week. You may return to work on 05/10/15. Keep procedure site clean & dry. If you notice increased pain, swelling, bleeding or pus, call/return!  You may shower, but no soaking baths/hot tubs/pools for 1 week.

## 2015-05-03 NOTE — H&P (Signed)
Robert Mcknight presents to the hospital today with a history of SVT.  He has an ablation scheduled.  He has tried amiodarone which has controlled his palpitations but wishes to be off of this medication due to side effects.  Explained the risks and benefits of ablation.  Risks include but are not limited to bleeding, tamponade, heart block, stroke, and death.  I discussed the possibility of not being able to get the tachycardia started and the consequence of a negative EP study.  The patient understands the risks and has agreed to the procedure.  Bessy Reaney Curt Bears, MD 05/03/2015 7:11 AM

## 2015-05-04 ENCOUNTER — Encounter (HOSPITAL_COMMUNITY): Payer: Self-pay | Admitting: Cardiology

## 2015-05-30 NOTE — Progress Notes (Signed)
Electrophysiology Office Note   Date:  05/31/2015   ID:  Robert Mcknight, DOB 07-28-1933, MRN [id number]  PCP:  Donnajean Lopes, MD  Cardiologist:  Claiborne Billings Primary Electrophysiologist:  Sohum Delillo Meredith Leeds, MD    Chief Complaint  Patient presents with  . Follow-up    post SVT ablation     History of Present Illness: Robert Mcknight is a 82 y.o. male who presents today for electrophysiology evaluation.   He was admitted on 2/6 for an atrial arrhythmia.  He was discharged on amiodarone and beta blockers.  He had a negative EP study on 05/03/15. Since that time, he is felt well. He has not noticed any palpitations. He has no other major complaints.  Today, he denies symptoms of palpitations, chest pain, shortness of breath, orthopnea, PND, lower extremity edema, claudication, dizziness, presyncope, syncope, bleeding, or neurologic sequela. The patient is tolerating medications without difficulties and is otherwise without complaint today.    Past Medical History  Diagnosis Date  . Diabetes mellitus   . Hypertension   . Seasonal allergies   . GERD (gastroesophageal reflux disease)   . Prostate cancer ([place]) 2011  . Heart murmur   . Sleep apnea    Past Surgical History  Procedure Laterality Date  . Inguinal hernia repair  1992    left  . Transurethral resection of prostate    . Flexible sigmoidoscopy N/A 03/23/2013    Procedure: FLEXIBLE SIGMOIDOSCOPY;  Surgeon: Beryle Beams, MD;  Location: [facility];  Service: Endoscopy;  Laterality: N/A;  . Nasal sinus surgery  02/01/15  . Cardiac catheterization N/A 02/26/2015    Procedure: Left Heart Cath and Coronary Angiography;  Surgeon: Belva Crome, MD;  Location: [facility];  Service: Cardiovascular;  Laterality: N/A;  . Electrophysiologic study N/A 05/03/2015    Procedure: SVT Ablation;  Surgeon: Kyrie Fludd Meredith Leeds, MD;  Location: [facility];  Service: Cardiovascular;  Laterality: N/A;     Current Outpatient  Prescriptions  Medication Sig Dispense Refill  . amLODipine (NORVASC) 10 MG tablet Take 1 tablet (10 mg total) by mouth daily. 30 tablet 6  . aspirin EC 81 MG EC tablet Take 1 tablet (81 mg total) by mouth daily. 30 tablet 0  . atorvastatin (LIPITOR) 40 MG tablet Take 1 tablet (40 mg total) by mouth daily at 6 PM. 30 tablet 11  . Bepotastine Besilate (BEPREVE) 1.5 % SOLN Place 1 drop into both eyes 2 (two) times daily as needed. Vision not clear    . Calcium Carb-Cholecalciferol (CALCIUM CARBONATE-VITAMIN D3 PO) Take 1 tablet by mouth 2 (two) times daily. Calcium 600 mg and Vitamin  D 800 units    . carvedilol (COREG) 12.5 MG tablet Take 1 tablet (12.5 mg total) by mouth 2 (two) times daily. 60 tablet 3  . cetirizine (ZYRTEC) 10 MG tablet Take 10 mg by mouth daily.    [name] Kitchen doxycycline (VIBRA-TABS) 100 MG tablet Take 100 mg by mouth 2 (two) times daily.  0  . lisinopril (PRINIVIL,ZESTRIL) 40 MG tablet Take 1 tablet (40 mg total) by mouth daily. 30 tablet 3  . metFORMIN (GLUMETZA) 500 MG (MOD) 24 hr tablet Take 500 mg by mouth 2 (two) times daily with a meal.    . Omega-3 Fatty Acids (FISH OIL) 1000 MG CPDR Take 2,000 mg by mouth 2 (two) times daily.     [name] Kitchen omeprazole (PRILOSEC) 20 MG capsule Take 20 mg by mouth 2 (two) times daily before  a meal.     . vitamin E (VITAMIN E) 1000 UNIT capsule Take 1,000 Units by mouth daily.     No current facility-administered medications for this visit.    Allergies:   Pollen extract   Social History:  The patient  reports that he has never smoked. He has never used smokeless tobacco. He reports that he does not drink alcohol or use illicit drugs.   Family History:  The patient's family history includes Colon cancer in his brother; Colon cancer (age of onset: 10) in his brother. There is no history of Stomach cancer.    ROS:  Please see the history of present illness.   Otherwise, review of systems is positive for none.   All other systems are reviewed and  negative.    PHYSICAL EXAM: VS:  BP 160/90 mmHg  Pulse 59  Ht 5\' 8"  (1.727 m)  Wt 196 lb 9.6 oz (89.177 kg)  BMI 29.90 kg/m2 , BMI Body mass index is 29.9 kg/(m^2). GEN: Well nourished, well developed, in no acute distress HEENT: normal Neck: no JVD, carotid bruits, or masses Cardiac: Blood pressure and problems. It is a history he is medicines 1 were adequately all her was he is in her mild as you a little bit more but not so much RRR; no murmurs, rubs, or gallops,no edema  Respiratory:  clear to auscultation bilaterally, normal work of breathing GI: soft, nontender, nondistended, + BS MS: no deformity or atrophy Skin: warm and dry Neuro:  Strength and sensation are intact Psych: euthymic mood, full affect  EKG:  EKG is ordered today. ECG today shows sinu bradycardia, 1 degree AV block, voltage criteria for LVH  Recent Labs: 02/25/2015: B Natriuretic Peptide 128.7*; Magnesium 1.8; TSH 2.125 02/26/2015: ALT 11* 04/29/2015: BUN 16; Creat 1.03; Hemoglobin 12.4*; Platelets 248; Potassium 4.0; Sodium 139    Lipid Panel     Component Value Date/Time   CHOL 189 02/26/2015 0939   TRIG 61 02/26/2015 0939   HDL 65 02/26/2015 0939   CHOLHDL 2.9 02/26/2015 0939   VLDL 12 02/26/2015 0939   LDLCALC 112* 02/26/2015 0939     Wt Readings from Last 3 Encounters:  05/31/15 196 lb 9.6 oz (89.177 kg)  05/03/15 193 lb (87.544 kg)  04/29/15 194 lb 6.4 oz (88.179 kg)      Other studies Reviewed: Additional studies/ records that were reviewed today include: Cardiac cath 02/26/15, TTE 02/26/15  Review of the above records today demonstrates:   Widely patent and essentially normal coronary arteries.  Normal left ventricular systolic function with estimated ejection fraction of 50%. Normal left ventricular hemodynamics.  - Left ventricle: The cavity size was normal. Wall thickness was  increased in a pattern of mild LVH. Systolic function was normal.  The estimated ejection fraction was in the  range of 60% to 65%.  Wall motion was normal; there were no regional wall motion  abnormalities. Doppler parameters are consistent with abnormal  left ventricular relaxation (grade 1 diastolic dysfunction). - Aortic valve: There was mild stenosis. Valve area (VTI): 1.43  cm^2. Valve area (Vmax): 1.41 cm^2. Valve area (Vmean): 1.28  cm^2.  ASSESSMENT AND PLAN:  1.  SVT: Negative EP study 05/03/15. No further SVT noted. We'll continue to monitor.  2.  Hypertension:  Blood pressure is quite elevated today. Currently takes lisinopril, Coreg, and amlodipine. Criss Pallone add HCTZ to his medication list.  Current medicines are reviewed at length with the patient today.   The patient does  not have concerns regarding his medicines.  The following changes were made today:  HCTZ 25 mg  Labs/ tests ordered today include:  No orders of the defined types were placed in this encounter.     Disposition:   FU with Keagen Heinlen 6 months  Signed, Rylyn Ranganathan Meredith Leeds, MD  05/31/2015 2:29 PM     [facility] [address] [place] [address] [phone] (office) [phone] (fax)

## 2015-05-31 ENCOUNTER — Encounter: Payer: Self-pay | Admitting: Cardiology

## 2015-05-31 ENCOUNTER — Ambulatory Visit (INDEPENDENT_AMBULATORY_CARE_PROVIDER_SITE_OTHER): Payer: Medicare Other | Admitting: Cardiology

## 2015-05-31 VITALS — BP 160/90 | HR 59 | Ht 68.0 in | Wt 196.6 lb

## 2015-05-31 DIAGNOSIS — I471 Supraventricular tachycardia, unspecified: Secondary | ICD-10-CM

## 2015-05-31 MED ORDER — HYDROCHLOROTHIAZIDE 25 MG PO TABS: 25.0000 mg | ORAL_TABLET | Freq: Every day | ORAL | Status: DC

## 2015-05-31 NOTE — Patient Instructions (Addendum)
Medication Instructions:  Your physician has recommended you make the following change in your medication:  1) START Hydrochlorothiazide 25 mg daily  Labwork: None ordered  Testing/Procedures: None ordered  Follow-Up: Your physician wants you to follow-up in: 6 months with Dr. Curt Bears. You will receive a reminder letter in the mail two months in advance. If you don't receive a letter, please call our office to schedule the follow-up appointment.  If you need a refill on your cardiac medications before your next appointment, please call your pharmacy.  Thank you for choosing [facility]!!   Trinidad Curet, RN [phone]

## 2015-06-10 ENCOUNTER — Ambulatory Visit: Payer: Medicare Other | Admitting: Cardiovascular Disease

## 2015-08-28 ENCOUNTER — Other Ambulatory Visit: Payer: Self-pay | Admitting: Cardiology

## 2015-08-30 ENCOUNTER — Other Ambulatory Visit: Payer: Self-pay | Admitting: *Deleted

## 2015-08-30 MED ORDER — LISINOPRIL 40 MG PO TABS: 40.0000 mg | ORAL_TABLET | Freq: Every day | ORAL | 6 refills | Status: DC

## 2015-09-10 ENCOUNTER — Ambulatory Visit (INDEPENDENT_AMBULATORY_CARE_PROVIDER_SITE_OTHER): Payer: Medicare Other | Admitting: Urology

## 2015-09-10 DIAGNOSIS — N3941 Urge incontinence: Secondary | ICD-10-CM | POA: Diagnosis not present

## 2015-09-10 DIAGNOSIS — Z8546 Personal history of malignant neoplasm of prostate: Secondary | ICD-10-CM

## 2015-09-10 DIAGNOSIS — R81 Glycosuria: Secondary | ICD-10-CM

## 2015-09-10 DIAGNOSIS — N3040 Irradiation cystitis without hematuria: Secondary | ICD-10-CM

## 2015-09-18 ENCOUNTER — Other Ambulatory Visit: Payer: Self-pay | Admitting: Cardiology

## 2015-09-23 ENCOUNTER — Other Ambulatory Visit: Payer: Self-pay | Admitting: Cardiology

## 2015-10-04 ENCOUNTER — Telehealth: Payer: Self-pay | Admitting: Cardiology

## 2015-10-04 NOTE — Telephone Encounter (Signed)
Reports cough throughout the day.  States cough did not start until medication/s started several months ago.  Informed patient Dr. Curt Bears is in the hospital today and that I would call pt today/tomorrow with recommendations from the physician. Patient verbalized understanding and agreeable to plan.

## 2015-10-04 NOTE — Telephone Encounter (Signed)
Stop metoprolol, start losartan

## 2015-10-04 NOTE — Telephone Encounter (Signed)
New message     Pt c/o medication issue:  1. Name of Medication: Lisinoprol 40mg   2. How are you currently taking this medication (dosage and times per day)? 1x day  3. Are you having a reaction (difficulty breathing--STAT)? no  4. What is your medication issue? Pt cant stop couching and its irritating his throat

## 2015-10-06 MED ORDER — LOSARTAN POTASSIUM 100 MG PO TABS: 100.0000 mg | ORAL_TABLET | Freq: Every day | ORAL | 3 refills | Status: DC

## 2015-10-06 NOTE — Telephone Encounter (Addendum)
Reviewed previous message w/ Camnitz for clarification of which medication to stop: Per Dr. Curt Bears -  Advised patient to stop Lisinopril and start Losartan 100 mg daily. [facility] [place]. Advised to call office back in several weeks if no improvement in cough. Patient verbalized understanding and agreeable to plan.

## 2015-10-27 ENCOUNTER — Other Ambulatory Visit: Payer: Self-pay | Admitting: Cardiology

## 2015-11-01 ENCOUNTER — Encounter: Payer: Self-pay | Admitting: Internal Medicine

## 2015-11-05 ENCOUNTER — Other Ambulatory Visit: Payer: Self-pay | Admitting: Cardiology

## 2015-11-05 NOTE — Telephone Encounter (Signed)
amLODipine (NORVASC) 10 MG tablet  Medication  Date: 10/27/2015 Department: [facility] Office Ordering/Authorizing: Will Meredith Leeds, MD  Order Providers   Prescribing Provider Encounter Provider  Will Meredith Leeds, MD Will Meredith Leeds, MD  Medication Detail    Disp Refills Start End   amLODipine (NORVASC) 10 MG tablet 30 tablet 11 10/27/2015    Sig: TAKE 1 TABLET EVERY DAY   E-Prescribing Status: Receipt confirmed by pharmacy (10/27/2015 8:19 AM EDT)   Pharmacy   [id number] [address], [place].

## 2015-12-08 ENCOUNTER — Encounter: Payer: Self-pay | Admitting: Cardiology

## 2015-12-08 ENCOUNTER — Telehealth: Payer: Self-pay | Admitting: *Deleted

## 2015-12-08 ENCOUNTER — Ambulatory Visit (INDEPENDENT_AMBULATORY_CARE_PROVIDER_SITE_OTHER): Payer: Medicare Other | Admitting: Cardiology

## 2015-12-08 VITALS — BP 154/84 | HR 80

## 2015-12-08 DIAGNOSIS — I471 Supraventricular tachycardia, unspecified: Secondary | ICD-10-CM

## 2015-12-08 NOTE — Telephone Encounter (Signed)
Patient called and stated that he was seen in the office today and was told to call the office to let the nurse know if he is taking the lisinopril. He is taking lisinopril 40 mg qd. [facility]. Please advise. Thanks, MI

## 2015-12-08 NOTE — Telephone Encounter (Signed)
I spoke with pt. He reports he did not stop Lisinopril (see phone note dated 10/04/15).  He has been taking lisinopril and losartan.  Cough still present.  I told pt he should stop lisinopril.

## 2015-12-08 NOTE — Patient Instructions (Signed)
Medication Instructions:  Your physician recommends that you continue on your current medications as directed. Please refer to the Current Medication list given to you today.   Labwork: none  Testing/Procedures: none  Follow-Up: Your physician recommends that you schedule a follow-up appointment as needed with Dr. Curt Bears    Any Other Special Instructions Will Be Listed Below (If Applicable).     If you need a refill on your cardiac medications before your next appointment, please call your pharmacy.

## 2015-12-08 NOTE — Progress Notes (Signed)
Electrophysiology Office Note   Date:  12/08/2015   ID:  DEL BLESSINGTON, DOB 19-Dec-1933, MRN [id number]  PCP:  Donnajean Lopes, MD  Cardiologist:  Claiborne Billings Primary Electrophysiologist:  [name], MD    Chief Complaint  Patient presents with  . Follow-up    SVT     History of Present Illness: Robert Mcknight is a 82 y.o. male who presents today for electrophysiology evaluation.   He was admitted on 2/6 for an atrial arrhythmia.  He was discharged on amiodarone and beta blockers.  He had a negative EP study on 05/03/15. Since that time, he is felt well. He has not noticed any palpitations. He says that he has been feeling well and able to do all of his daily activities.  Today, he denies symptoms of palpitations, chest pain, shortness of breath, orthopnea, PND, lower extremity edema, claudication, dizziness, presyncope, syncope, bleeding, or neurologic sequela. The patient is tolerating medications without difficulties and is otherwise without complaint today.    Past Medical History:  Diagnosis Date  . Diabetes mellitus   . GERD (gastroesophageal reflux disease)   . Heart murmur   . Hypertension   . Prostate cancer ([place]) 2011  . Seasonal allergies   . Sleep apnea    Past Surgical History:  Procedure Laterality Date  . CARDIAC CATHETERIZATION N/A 02/26/2015   Procedure: Left Heart Cath and Coronary Angiography;  Surgeon: Belva Crome, MD;  Location: [facility];  Service: Cardiovascular;  Laterality: N/A;  . ELECTROPHYSIOLOGIC STUDY N/A 05/03/2015   Procedure: SVT Ablation;  Surgeon: Sujay Grundman Meredith Leeds, MD;  Location: [facility];  Service: Cardiovascular;  Laterality: N/A;  . FLEXIBLE SIGMOIDOSCOPY N/A 03/23/2013   Procedure: FLEXIBLE SIGMOIDOSCOPY;  Surgeon: Beryle Beams, MD;  [facility];  Service: Endoscopy;  Laterality: N/A;  . INGUINAL HERNIA REPAIR  1992   left  . NASAL SINUS SURGERY  02/01/15  . TRANSURETHRAL RESECTION OF PROSTATE        Current Outpatient Prescriptions  Medication Sig Dispense Refill  . amLODipine (NORVASC) 10 MG tablet TAKE 1 TABLET EVERY DAY 30 tablet 11  . aspirin EC 81 MG EC tablet Take 1 tablet (81 mg total) by mouth daily. 30 tablet 0  . atorvastatin (LIPITOR) 40 MG tablet Take 1 tablet (40 mg total) by mouth daily at 6 PM. 30 tablet 11  . Bepotastine Besilate (BEPREVE) 1.5 % SOLN Place 1 drop into both eyes 2 (two) times daily as needed. Vision not clear    . Calcium Carb-Cholecalciferol (CALCIUM CARBONATE-VITAMIN D3 PO) Take 1 tablet by mouth 2 (two) times daily. Calcium 600 mg and Vitamin  D 800 units    . carvedilol (COREG) 12.5 MG tablet TAKE 1 TABLET TWICE A DAY 60 tablet 9  . hydrochlorothiazide (HYDRODIURIL) 25 MG tablet Take 1 tablet (25 mg total) by mouth daily. 30 tablet 6  . loratadine-pseudoephedrine (CLARITIN-D 24-HOUR) 10-240 MG 24 hr tablet Take 1 tablet by mouth daily.    [name] Kitchen losartan (COZAAR) 100 MG tablet Take 1 tablet (100 mg total) by mouth daily. 90 tablet 3  . metFORMIN (GLUMETZA) 500 MG (MOD) 24 hr tablet Take 500 mg by mouth 2 (two) times daily with a meal.    . Omega-3 Fatty Acids (FISH OIL) 1000 MG CPDR Take 2,000 mg by mouth 2 (two) times daily.     [name] Kitchen omeprazole (PRILOSEC) 20 MG capsule Take 20 mg by mouth 2 (two) times daily before a  meal.     . vitamin E (VITAMIN E) 1000 UNIT capsule Take 1,000 Units by mouth daily.     No current facility-administered medications for this visit.   1  Allergies:   Pollen extract   Social History:  The patient  reports that he has never smoked. He has never used smokeless tobacco. He reports that he does not drink alcohol or use drugs.   Family History:  The patient's family history includes Colon cancer in his brother; Colon cancer (age of onset: 31) in his brother.    ROS:  Please see the history of present illness.   Otherwise, review of systems is positive for cough.   All other systems are reviewed and negative.    PHYSICAL  EXAM: VS:  BP (!) 154/84   Pulse 80  , BMI There is no height or weight on file to calculate BMI. GEN: Well nourished, well developed, in no acute distress  HEENT: normal  Neck: no JVD, carotid bruits, or masses Cardiac: RRR; no murmurs, rubs, or gallops,no edema  Respiratory:  clear to auscultation bilaterally, normal work of breathing GI: soft, nontender, nondistended, + BS MS: no deformity or atrophy  Skin: warm and dry Neuro:  Strength and sensation are intact Psych: euthymic mood, full affect  EKG:  EKG is ordered today. Personal review of the ECG 05/31/15 shows sinus bradycardia, 1 degree AV block, voltage criteria for LVH  Recent Labs: 02/25/2015: B Natriuretic Peptide 128.7; Magnesium 1.8; TSH 2.125 02/26/2015: ALT 11 04/29/2015: BUN 16; Creat 1.03; Hemoglobin 12.4; Platelets 248; Potassium 4.0; Sodium 139    Lipid Panel     Component Value Date/Time   CHOL 189 02/26/2015 0939   TRIG 61 02/26/2015 0939   HDL 65 02/26/2015 0939   CHOLHDL 2.9 02/26/2015 0939   VLDL 12 02/26/2015 0939   LDLCALC 112 (H) 02/26/2015 0939     Wt Readings from Last 3 Encounters:  05/31/15 196 lb 9.6 oz (89.2 kg)  05/03/15 193 lb (87.5 kg)  04/29/15 194 lb 6.4 oz (88.2 kg)      Other studies Reviewed: Additional studies/ records that were reviewed today include: Cardiac cath 02/26/15, TTE 02/26/15  Review of the above records today demonstrates:   Widely patent and essentially normal coronary arteries.  Normal left ventricular systolic function with estimated ejection fraction of 50%. Normal left ventricular hemodynamics.  - Left ventricle: The cavity size was normal. Wall thickness was  increased in a pattern of mild LVH. Systolic function was normal.  The estimated ejection fraction was in the range of 60% to 65%.  Wall motion was normal; there were no regional wall motion  abnormalities. Doppler parameters are consistent with abnormal  left ventricular relaxation (grade 1 diastolic  dysfunction). - Aortic valve: There was mild stenosis. Valve area (VTI): 1.43  cm^2. Valve area (Vmax): 1.41 cm^2. Valve area (Vmean): 1.28  cm^2.  ASSESSMENT AND PLAN:  1.  SVT: Negative EP study 05/03/15. No further SVT noted. He has been feeling well without any major complaints. We'll continue to monitor. He Sani Madariaga call us back if he has any further issues.  2.  Hypertension:  He is blood pressure is elevated today in clinic, but he does bring in his blood pressure recordings from home which showed normal blood pressures. We'll continue him on his Coreg, lisinopril, HCTZ, and amlodipine.   Current medicines are reviewed at length with the patient today.   The patient does not have concerns regarding his medicines.  The following changes were made today:  HCTZ 25 mg  Labs/ tests ordered today include:  No orders of the defined types were placed in this encounter.    Disposition:   FU with Graham Hyun PRN  Signed, Tamula Morrical Meredith Leeds, MD  12/08/2015 11:37 AM     [place] [address] [phone] (office) [phone] (fax)

## 2015-12-19 ENCOUNTER — Other Ambulatory Visit: Payer: Self-pay | Admitting: Cardiology

## 2016-01-22 ENCOUNTER — Other Ambulatory Visit: Payer: Self-pay | Admitting: Cardiology

## 2016-01-25 ENCOUNTER — Other Ambulatory Visit: Payer: Self-pay

## 2016-01-25 MED ORDER — HYDROCHLOROTHIAZIDE 25 MG PO TABS: 25.0000 mg | ORAL_TABLET | Freq: Every day | ORAL | 3 refills | Status: DC

## 2016-02-03 ENCOUNTER — Telehealth: Payer: Self-pay | Admitting: Cardiology

## 2016-02-03 NOTE — Telephone Encounter (Signed)
Pt tells me that Dr. Redmond Baseman started/increased his Prilosec.  I advised pt to call his office for refills.  I gave him phone number to their office per pt request.

## 2016-02-03 NOTE — Telephone Encounter (Signed)
New message   Pt verbalized that he wants to speak to rn because Dr.Bates and Dr.Camnitz has written the same medication Oeprazole 40mg   Pt said that he has increased the medication two twice a day and he has ran out.

## 2016-03-10 ENCOUNTER — Ambulatory Visit (INDEPENDENT_AMBULATORY_CARE_PROVIDER_SITE_OTHER): Payer: Medicare Other | Admitting: Urology

## 2016-03-10 DIAGNOSIS — N401 Enlarged prostate with lower urinary tract symptoms: Secondary | ICD-10-CM

## 2016-03-10 DIAGNOSIS — Z8546 Personal history of malignant neoplasm of prostate: Secondary | ICD-10-CM

## 2016-03-10 DIAGNOSIS — N3941 Urge incontinence: Secondary | ICD-10-CM | POA: Diagnosis not present

## 2016-04-16 ENCOUNTER — Other Ambulatory Visit: Payer: Self-pay | Admitting: Cardiology

## 2016-04-26 ENCOUNTER — Other Ambulatory Visit: Payer: Self-pay | Admitting: Cardiology

## 2016-06-02 ENCOUNTER — Ambulatory Visit (INDEPENDENT_AMBULATORY_CARE_PROVIDER_SITE_OTHER): Payer: Medicare Other | Admitting: Urology

## 2016-06-02 DIAGNOSIS — N401 Enlarged prostate with lower urinary tract symptoms: Secondary | ICD-10-CM | POA: Diagnosis not present

## 2016-06-02 DIAGNOSIS — Z8546 Personal history of malignant neoplasm of prostate: Secondary | ICD-10-CM | POA: Diagnosis not present

## 2016-07-18 ENCOUNTER — Other Ambulatory Visit: Payer: Self-pay | Admitting: Cardiology

## 2016-09-18 ENCOUNTER — Other Ambulatory Visit: Payer: Self-pay | Admitting: Cardiology

## 2016-10-23 DIAGNOSIS — K047 Periapical abscess without sinus: Secondary | ICD-10-CM

## 2016-10-23 HISTORY — DX: Periapical abscess without sinus: K04.7

## 2016-10-29 ENCOUNTER — Other Ambulatory Visit: Payer: Self-pay | Admitting: Cardiology

## 2016-11-01 ENCOUNTER — Other Ambulatory Visit: Payer: Self-pay | Admitting: Cardiology

## 2016-11-05 ENCOUNTER — Encounter (HOSPITAL_COMMUNITY): Payer: Self-pay | Admitting: Emergency Medicine

## 2016-11-05 ENCOUNTER — Emergency Department (HOSPITAL_COMMUNITY)
Admission: EM | Admit: 2016-11-05 | Discharge: 2016-11-05 | Disposition: A | Discharge status: Home / Self Care | Payer: Medicare Other | Attending: Emergency Medicine | Admitting: Emergency Medicine

## 2016-11-05 DIAGNOSIS — Z79899 Other long term (current) drug therapy: Secondary | ICD-10-CM | POA: Insufficient documentation

## 2016-11-05 DIAGNOSIS — Z7984 Long term (current) use of oral hypoglycemic drugs: Secondary | ICD-10-CM | POA: Diagnosis not present

## 2016-11-05 DIAGNOSIS — E119 Type 2 diabetes mellitus without complications: Secondary | ICD-10-CM | POA: Diagnosis not present

## 2016-11-05 DIAGNOSIS — I10 Essential (primary) hypertension: Secondary | ICD-10-CM | POA: Insufficient documentation

## 2016-11-05 DIAGNOSIS — Z7982 Long term (current) use of aspirin: Secondary | ICD-10-CM | POA: Insufficient documentation

## 2016-11-05 DIAGNOSIS — K921 Melena: Secondary | ICD-10-CM

## 2016-11-05 LAB — CBC
HEMATOCRIT: 33.9 % — AB (ref 39.0–52.0)
Hemoglobin: 11.2 g/dL — ABNORMAL LOW (ref 13.0–17.0)
MCH: 26.7 pg (ref 26.0–34.0)
MCHC: 33 g/dL (ref 30.0–36.0)
MCV: 80.9 fL (ref 78.0–100.0)
Platelets: 218 10*3/uL (ref 150–400)
RBC: 4.19 MIL/uL — AB (ref 4.22–5.81)
RDW: 18.4 % — ABNORMAL HIGH (ref 11.5–15.5)
WBC: 8.7 10*3/uL (ref 4.0–10.5)

## 2016-11-05 LAB — COMPREHENSIVE METABOLIC PANEL
ALT: 18 U/L (ref 17–63)
ANION GAP: 11 (ref 5–15)
AST: 21 U/L (ref 15–41)
Albumin: 3.5 g/dL (ref 3.5–5.0)
Alkaline Phosphatase: 60 U/L (ref 38–126)
BUN: 17 mg/dL (ref 6–20)
CHLORIDE: 98 mmol/L — AB (ref 101–111)
CO2: 23 mmol/L (ref 22–32)
Calcium: 8.6 mg/dL — ABNORMAL LOW (ref 8.9–10.3)
Creatinine, Ser: 1.3 mg/dL — ABNORMAL HIGH (ref 0.61–1.24)
GFR, EST AFRICAN AMERICAN: 58 mL/min — AB (ref >60)
GFR, EST NON AFRICAN AMERICAN: 50 mL/min — AB (ref >60)
Glucose, Bld: 152 mg/dL — ABNORMAL HIGH (ref 65–99)
POTASSIUM: 3.2 mmol/L — AB (ref 3.5–5.1)
Sodium: 132 mmol/L — ABNORMAL LOW (ref 135–145)
Total Bilirubin: 0.8 mg/dL (ref 0.3–1.2)
Total Protein: 6.3 g/dL — ABNORMAL LOW (ref 6.5–8.1)

## 2016-11-05 LAB — TYPE AND SCREEN
ABO/RH(D): A POS
Antibody Screen: NEGATIVE

## 2016-11-05 LAB — POC OCCULT BLOOD, ED: FECAL OCCULT BLD: POSITIVE — AB

## 2016-11-05 NOTE — Discharge Instructions (Signed)
As discussed, your evaluation today has been largely reassuring.  But, it is important that you monitor your condition carefully, and do not hesitate to return to the ED if you develop new, or concerning changes in your condition. ° °Otherwise, please follow-up with your physicians for appropriate ongoing care. ° °

## 2016-11-05 NOTE — ED Provider Notes (Signed)
[facility] DEPT Provider Note   CSN: [id number] Arrival date & time: 11/05/16  1943     History   Chief Complaint Chief Complaint  Patient presents with  . Blood In Stools    HPI Robert Mcknight is a 83 y.o. male.  HPI Patient presents with concern of bloody stool. He notes prior history of similar bleeding, with diagnosis of diverticulosis about 3 years ago. Since that time he has had colonoscopy, does not recall remarkable findings. He was in his usual state of health until earlier today, when he had the first of 3 episodes of bloody stool. Episodes occur after a brief period of tenesmus, but with no abdominal pain, and currently has no repair No lightheadedness, no chest pain, no syncope, no dyspnea. Patient has history of prostate cancer, has a brachytherapy, no surgery. Since onset earlier today no clear alleviating, exacerbating, precipitating conditions. Past Medical History:  Diagnosis Date  . Diabetes mellitus   . GERD (gastroesophageal reflux disease)   . Heart murmur   . Hypertension   . Prostate cancer ([place]) 2011  . Seasonal allergies   . Sleep apnea     Patient Active Problem List   Diagnosis Date Noted  . SVT (supraventricular tachycardia) ([place])   . Aortic stenosis, mild   . Paroxysmal SVT (supraventricular tachycardia) ([place]) 02/27/2015  . Hypokalemia 02/26/2015  . Tachyarrhythmia 02/25/2015  . Chest pain 02/25/2015  . 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 Bradycardia 03/24/2013    Class: Acute  . Obstructive sleep apnea 03/24/2013    Class: Chronic  . Type II diabetes mellitus ([place]) 03/22/2013  . Essential hypertension, benign 03/22/2013  . Other and unspecified hyperlipidemia 03/22/2013  . Diverticular hemorrhage 03/20/2013    Past Surgical History:  Procedure Laterality Date  . CARDIAC CATHETERIZATION N/A 02/26/2015   Procedure: Left Heart Cath and Coronary Angiography;  Surgeon: Belva Crome, MD;  Location: [facility];  Service:  Cardiovascular;  Laterality: N/A;  . ELECTROPHYSIOLOGIC STUDY N/A 05/03/2015   Procedure: SVT Ablation;  Surgeon: Will Meredith Leeds, MD;  Location: [facility];  Service: Cardiovascular;  Laterality: N/A;  . FLEXIBLE SIGMOIDOSCOPY N/A 03/23/2013   Procedure: FLEXIBLE SIGMOIDOSCOPY;  Surgeon: Beryle Beams, MD;  Location: [facility];  Service: Endoscopy;  Laterality: N/A;  . INGUINAL HERNIA REPAIR  1992   left  . NASAL SINUS SURGERY  02/01/15  . TRANSURETHRAL RESECTION OF PROSTATE         Home Medications    Prior to Admission medications   Medication Sig Start Date End Date Taking? Authorizing Provider  amLODipine (NORVASC) 10 MG tablet TAKE 1 TABLET EVERY DAY 10/30/16   Camnitz, Ocie Doyne, MD  aspirin EC 81 MG EC tablet Take 1 tablet (81 mg total) by mouth daily. 03/01/15   Orson Eva, MD  atorvastatin (LIPITOR) 40 MG tablet TAKE 1 TABLET BY MOUTH DAILY AT 6PM 04/26/16   Camnitz, Ocie Doyne, MD  Bepotastine Besilate (BEPREVE) 1.5 % SOLN Place 1 drop into both eyes 2 (two) times daily as needed. Vision not clear    [provider]  Calcium Carb-Cholecalciferol (CALCIUM CARBONATE-VITAMIN D3 PO) Take 1 tablet by mouth 2 (two) times daily. Calcium 600 mg and Vitamin  D 800 units    [provider]  carvedilol (COREG) 12.5 MG tablet TAKE 1 TABLET TWICE A DAY 07/18/16   Camnitz, Will Hassell Done, MD  hydrochlorothiazide (HYDRODIURIL) 25 MG tablet Take 1 tablet (25 mg total) by mouth daily. 01/25/16  Camnitz, Will Hassell Done, MD  loratadine-pseudoephedrine (CLARITIN-D 24-HOUR) 10-240 MG 24 hr tablet Take 1 tablet by mouth daily.    [provider]  losartan (COZAAR) 100 MG tablet TAKE 1 TABLET BY MOUTH EVERY DAY 09/18/16   Camnitz, Ocie Doyne, MD  metFORMIN (GLUMETZA) 500 MG (MOD) 24 hr tablet Take 500 mg by mouth 2 (two) times daily with a meal.    [provider]  Omega-3 Fatty Acids (FISH OIL) 1000 MG CPDR Take 2,000 mg by mouth 2 (two) times daily.      [provider]  omeprazole (PRILOSEC) 20 MG capsule Take 20 mg by mouth 2 (two) times daily before a meal.     [provider]  vitamin E (VITAMIN E) 1000 UNIT capsule Take 1,000 Units by mouth daily.    [provider]    Family History Family History  Problem Relation Age of Onset  . Colon cancer Brother 51  . Colon cancer Brother   . Stomach cancer Neg Hx     Social History Social History  Substance Use Topics  . Smoking status: Never Smoker  . Smokeless tobacco: Never Used  . Alcohol use No     Allergies   Pollen extract   Review of Systems Review of Systems  Constitutional:       Per HPI, otherwise negative  HENT:       Per HPI, otherwise negative  Respiratory:       Per HPI, otherwise negative  Cardiovascular:       Per HPI, otherwise negative  Gastrointestinal: Positive for blood in stool. Negative for vomiting.  Endocrine:       Negative aside from HPI  Genitourinary:       Neg aside from HPI   Musculoskeletal:       Per HPI, otherwise negative  Skin: Negative.   Neurological: Negative for syncope.     Physical Exam Updated Vital Signs BP (!) 155/85 (BP Location: Right Arm)   Pulse 71   Temp 97.6 F (36.4 C) (Oral)   Resp 16   Ht 5\' 9"  (1.753 m)   Wt 86.2 kg (190 lb)   SpO2 98%   BMI 28.06 kg/m   Physical Exam  Constitutional: He is oriented to person, place, and time. He appears well-developed. No distress.  HENT:  Head: Normocephalic and atraumatic.  Eyes: Conjunctivae and EOM are normal.  Cardiovascular: Normal rate and regular rhythm.   Pulmonary/Chest: Effort normal. No stridor. No respiratory distress.  Abdominal: He exhibits no distension. There is no tenderness. There is no guarding.  Genitourinary: Rectal exam shows guaiac positive stool.  Musculoskeletal: He exhibits no edema.  Neurological: He is alert and oriented to person, place, and time.  Skin: Skin is warm and dry.  Psychiatric: He has a  normal mood and affect.  Nursing note and vitals reviewed.    ED Treatments / Results  Labs (all labs ordered are listed, but only abnormal results are displayed) Labs Reviewed  COMPREHENSIVE METABOLIC PANEL - Abnormal; Notable for the following:       Result Value   Sodium 132 (*)    Potassium 3.2 (*)    Chloride 98 (*)    Glucose, Bld 152 (*)    Creatinine, Ser 1.30 (*)    Calcium 8.6 (*)    Total Protein 6.3 (*)    GFR calc non Af Amer 50 (*)    GFR calc Af Amer 58 (*)    All other  components within normal limits  CBC - Abnormal; Notable for the following:    RBC 4.19 (*)    Hemoglobin 11.2 (*)    HCT 33.9 (*)    RDW 18.4 (*)    All other components within normal limits  POC OCCULT BLOOD, ED - Abnormal; Notable for the following:    Fecal Occult Bld POSITIVE (*)    All other components within normal limits  TYPE AND SCREEN  ABO/RH    Procedures Procedures (including critical care time)  Medications Ordered in ED Medications - No data to display   Initial Impression / Assessment and Plan / ED Course  I have reviewed the triage vital signs and the nursing notes.  Pertinent labs & imaging results that were available during my care of the patient were reviewed by me and considered in my medical decision making (see chart for details).  On repeat exam the patient is in no distress, awake, alert. I discussed all findings with him and his companion. Labs reassuring, no anemia. He remains hemodynamically stable. Patient had Hemoccult-positive status, but has no evidence for substantial exsanguination, and given his history of diverticulosis, and colonoscopy within the past 2 years, is low suspicion for new mass, diverticulitis. With reassuring vital signs, the patient was discharged to follow-up with his gastroenterologist.  Final Clinical Impressions(s) / ED Diagnoses  Rectal bleeding   Carmin Muskrat, MD 11/05/16 2332

## 2016-11-05 NOTE — ED Triage Notes (Signed)
Pt reports blood in stool starting today around 1700.  Pt reports his stools have been loose and he has been having light cramping.  Denies any fever, loc, increased weakness.

## 2016-11-06 LAB — ABO/RH: ABO/RH(D): A POS

## 2016-11-07 ENCOUNTER — Encounter (INDEPENDENT_AMBULATORY_CARE_PROVIDER_SITE_OTHER): Payer: Self-pay | Admitting: Physical Medicine and Rehabilitation

## 2016-11-07 ENCOUNTER — Ambulatory Visit (INDEPENDENT_AMBULATORY_CARE_PROVIDER_SITE_OTHER): Payer: Medicare Other | Admitting: Physical Medicine and Rehabilitation

## 2016-11-07 VITALS — BP 139/76 | Temp 97.8°F

## 2016-11-07 DIAGNOSIS — M48061 Spinal stenosis, lumbar region without neurogenic claudication: Secondary | ICD-10-CM | POA: Diagnosis not present

## 2016-11-07 DIAGNOSIS — M5416 Radiculopathy, lumbar region: Secondary | ICD-10-CM

## 2016-11-07 NOTE — Progress Notes (Deleted)
Lower back pain, right side, with radiating pain down right leg. Most pain with bending. Increased pain 3 weeks.  No surgeries on back or hip.  Previous cortisone injection in right hip, helpful  No blood thinners. Has driver. No contrast allergies.  Prednisone, meloxicam, flexeril

## 2016-11-08 ENCOUNTER — Encounter: Payer: Self-pay | Admitting: Physician Assistant

## 2016-11-16 ENCOUNTER — Other Ambulatory Visit (INDEPENDENT_AMBULATORY_CARE_PROVIDER_SITE_OTHER): Payer: Medicare Other

## 2016-11-16 ENCOUNTER — Ambulatory Visit (INDEPENDENT_AMBULATORY_CARE_PROVIDER_SITE_OTHER): Payer: Medicare Other | Admitting: Physician Assistant

## 2016-11-16 ENCOUNTER — Encounter: Payer: Self-pay | Admitting: Physician Assistant

## 2016-11-16 VITALS — BP 130/70 | HR 71 | Ht 69.0 in | Wt 204.0 lb

## 2016-11-16 DIAGNOSIS — Z8719 Personal history of other diseases of the digestive system: Secondary | ICD-10-CM

## 2016-11-16 DIAGNOSIS — D649 Anemia, unspecified: Secondary | ICD-10-CM | POA: Diagnosis not present

## 2016-11-16 DIAGNOSIS — K5730 Diverticulosis of large intestine without perforation or abscess without bleeding: Secondary | ICD-10-CM | POA: Diagnosis not present

## 2016-11-16 DIAGNOSIS — Z1211 Encounter for screening for malignant neoplasm of colon: Secondary | ICD-10-CM

## 2016-11-16 DIAGNOSIS — Z86010 Personal history of colonic polyps: Secondary | ICD-10-CM

## 2016-11-16 LAB — CBC WITH DIFFERENTIAL/PLATELET
BASOS ABS: 0.1 10*3/uL (ref 0.0–0.1)
Basophils Relative: 0.9 % (ref 0.0–3.0)
EOS ABS: 0.4 10*3/uL (ref 0.0–0.7)
EOS PCT: 4.9 % (ref 0.0–5.0)
HCT: 31.5 % — ABNORMAL LOW (ref 39.0–52.0)
Hemoglobin: 10 g/dL — ABNORMAL LOW (ref 13.0–17.0)
LYMPHS ABS: 1.6 10*3/uL (ref 0.7–4.0)
Lymphocytes Relative: 20.7 % (ref 12.0–46.0)
MCHC: 31.9 g/dL (ref 30.0–36.0)
MCV: 82 fl (ref 78.0–100.0)
MONO ABS: 0.8 10*3/uL (ref 0.1–1.0)
Monocytes Relative: 10 % (ref 3.0–12.0)
NEUTROS PCT: 63.5 % (ref 43.0–77.0)
Neutro Abs: 5 10*3/uL (ref 1.4–7.7)
Platelets: 233 10*3/uL (ref 150.0–400.0)
RBC: 3.84 Mil/uL — AB (ref 4.22–5.81)
RDW: 19 % — ABNORMAL HIGH (ref 11.5–15.5)
WBC: 7.8 10*3/uL (ref 4.0–10.5)

## 2016-11-16 MED ORDER — NA SULFATE-K SULFATE-MG SULF 17.5-3.13-1.6 GM/177ML PO SOLN: ORAL | 0 refills | Status: DC

## 2016-11-16 NOTE — Patient Instructions (Addendum)
Please go to the basement level to have your labs drawn.   You have been scheduled for a colonoscopy. Please follow written instructions given to you at your visit today.  Please pick up your prep supplies at the pharmacy within the next 1-3 days.  [facility], [place], [place].  If you use inhalers (even only as needed), please bring them with you on the day of your procedure. Your physician has requested that you go to www.startemmi.com and enter the access code given to you at your visit today. This web site gives a general overview about your procedure. However, you should still follow specific instructions given to you by our office regarding your preparation for the procedure.   If you are age 71 or older, your body mass index should be between 23-30. Your Body mass index is 30.13 kg/m. If this is out of the aforementioned range listed, please consider follow up with your Primary Care Provider.

## 2016-11-16 NOTE — Progress Notes (Signed)
Subjective:    Patient ID: Robert Mcknight, male    DOB: 07-15-1933, 83 y.o.   MRN: [id number]  HPI Robert Mcknight is a pleasant 83 year old African-American male, known to Dr. Henrene Pastor who is referred today by Dr. Bevelyn Buckles after recent episode of GI bleeding. Patient was last seen in our office in 2012, at which time he had colonoscopy for history of polyps. He had 2 diminutive polyps removed from the transverse and ascending colon which were both tubular adenomas. Also noted to have severe diverticulosis in the left colon. He was indicated for 5 year interval follow-up if medically fit. Patient has history of previous diverticular bleed and was hospitalized in March 2015. He did undergo flexible sigmoidoscopy at that time per Dr. 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 Patient says he has done well since hospitalization in 2015 for diverticular bleeding until he had onset of bleeding on 11/05/2016. He says he had 3 episodes at home of grossly bloody stool prior to ER evaluation. He was allowed discharged to home from the emergency room as he was felt to be very stable. Hemoglobin was 11.2 hematocrit 33.9, as compared to hemoglobin in 2017 of 12.4 and hematocrit of 38. Patient says the following morning after the ER visit she's passed a small amount of blood and then had no further bleeding thereafter. He denies any abdominal pain or cramping. Bowel movements have been normal since. He does mention that he's had a lot of gas, flatulence and some bloating over the past couple of months. Other medical problems include history of SVT and sleep apnea, hypertension and adult-onset diabetes mellitus.  Review of Systems Pertinent positive and negative review of systems were noted in the above HPI section.  All other review of systems was otherwise negative.  Outpatient Encounter Prescriptions as of 11/16/2016  Medication Sig    . amLODipine (NORVASC) 10 MG tablet TAKE 1 TABLET EVERY DAY  . aspirin EC 81 MG EC tablet Take 1 tablet (81 mg total) by mouth daily.  [name] Kitchen atorvastatin (LIPITOR) 40 MG tablet TAKE 1 TABLET BY MOUTH DAILY AT 6PM  . Bepotastine Besilate 1.5 % SOLN Place 1 drop into both eyes as needed.  . Calcium Carb-Cholecalciferol (CALCIUM CARBONATE-VITAMIN D3 PO) Take 1 tablet by mouth 2 (two) times daily. Calcium 600 mg and Vitamin  D 800 units  . carvedilol (COREG) 12.5 MG tablet TAKE 1 TABLET TWICE A DAY  . hydrochlorothiazide (HYDRODIURIL) 25 MG tablet Take 25 mg by mouth daily.  [name] Kitchen losartan (COZAAR) 100 MG tablet Take 100 mg by mouth daily.  . metFORMIN (GLUMETZA) 500 MG (MOD) 24 hr tablet Take 500 mg by mouth 2 (two) times daily with a meal.  . Omega-3 Fatty Acids (FISH OIL) 1000 MG CPDR Take 1,200 mg by mouth 2 (two) times daily.   [name] Kitchen omeprazole (PRILOSEC) 20 MG capsule Take 20 mg by mouth 2 (two) times daily before a meal.   . predniSONE (DELTASONE) 10 MG tablet Take 10 mg by mouth daily with breakfast.  . Sodium Fluoride (CLINPRO 5000) 1.1 % PSTE Place 1 application onto teeth 2 (two) times daily.  . vitamin E (VITAMIN E) 1000 UNIT capsule Take 400 Units by mouth daily.   . Na Sulfate-K Sulfate-Mg Sulf 17.5-3.13-1.6 GM/177ML SOLN Take as directed for colonoscopy.  . [DISCONTINUED] Bepotastine Besilate (BEPREVE) 1.5 % SOLN Place 1 drop into both eyes 2 (two) times daily as needed (  blurry eyes). Vision not clear   . [DISCONTINUED] cyclobenzaprine (FLEXERIL) 10 MG tablet Take 10 mg by mouth 3 (three) times daily as needed for muscle spasms.  . [DISCONTINUED] hydrochlorothiazide (HYDRODIURIL) 25 MG tablet Take 1 tablet (25 mg total) by mouth daily.  . [DISCONTINUED] loratadine-pseudoephedrine (CLARITIN-D 24-HOUR) 10-240 MG 24 hr tablet Take 1 tablet by mouth daily as needed for allergies.   . [DISCONTINUED] losartan (COZAAR) 100 MG tablet TAKE 1 TABLET BY MOUTH EVERY DAY  . [DISCONTINUED] meloxicam (MOBIC)  15 MG tablet Take 15 mg by mouth daily.   No facility-administered encounter medications on file as of 11/16/2016.    Allergies  Allergen Reactions  . Pollen Extract Other (See Comments)    Runny nose, congestion, cough   Patient Active Problem List   Diagnosis Date Noted  . SVT (supraventricular tachycardia) ([place])   . Aortic stenosis, mild   . Paroxysmal SVT (supraventricular tachycardia) ([place]) 02/27/2015  . Hypokalemia 02/26/2015  . Tachyarrhythmia 02/25/2015  . Chest pain 02/25/2015  . 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 Bradycardia 03/24/2013    Class: Acute  . Obstructive sleep apnea 03/24/2013    Class: Chronic  . Type II diabetes mellitus ([place]) 03/22/2013  . Essential hypertension, benign 03/22/2013  . Other and unspecified hyperlipidemia 03/22/2013  . Diverticular hemorrhage 03/20/2013   Social History   Social History  . Marital status: Married    Spouse name: N/A  . Number of children: N/A  . Years of education: N/A   Occupational History  . Not on file.   Social History Main Topics  . Smoking status: Never Smoker  . Smokeless tobacco: Never Used  . Alcohol use No  . Drug use: No  . Sexual activity: No   Other Topics Concern  . Not on file   Social History Narrative  . No narrative on file    Mr. Upadhyay's family history includes Colon cancer in his brother; Colon cancer (age of onset: 64) in his brother.      Objective:    Vitals:   11/16/16 0954  BP: 130/70  Pulse: 71    Physical Exam  well-developed elderly African-American male in no acute distress, pleasant gentleman in stated age blood pressure 130/70 pulse 71, BMI 30.1. HEENT; nontraumatic normocephalic EOMI PERRLA sclera anicteric, Cardiovascular; regular rate and rhythm with S1-S2 no murmur or gallop, Pulmonary; clear bilaterally, Abdomen ;soft, nontender nondistended bowel sounds are active there is no palpable mass or hepatosplenomegaly, Rectal; exam not done, Ext; no clubbing cyanosis or  edema skin warm and dry, Neuropsych; mood and affect appropriate       Assessment & Plan:   #47 83 year old African-American male with recent self-limited diverticular bleed, with ER visit on 11/05/2016, not admitted to the hospital. No further active bleeding over the past 10 days. #2 mild normocytic anemia secondary to above Prior history of diverticular bleed 2015 #4 history of adenomatous colon polyps-due for follow-up colonoscopy  #5 severe left colon diverticulosis #6 sleep apnea #7 history of SVT #8 hypertension #9 adult-onset diabetes mellitus  Plan; repeat CBC today Patient will be scheduled for colonoscopy with Dr. Henrene Pastor. Procedure discussed in detail with patient including risks and benefits and he is agreeable to proceed. Is advised to use Gas-X on an as-needed basis for gas and bloating.  Amy Genia Harold PA-C 11/16/2016   Cc: Leanna Battles, MD

## 2016-11-17 ENCOUNTER — Encounter (INDEPENDENT_AMBULATORY_CARE_PROVIDER_SITE_OTHER): Payer: Self-pay | Admitting: Physical Medicine and Rehabilitation

## 2016-11-17 NOTE — Patient Instructions (Signed)
.  fn 

## 2016-11-17 NOTE — Progress Notes (Signed)
Robert Mcknight - 83 y.o. male MRN [id number]  Date of birth: 01-07-1934  Office Visit Note: Visit Date: 11/07/2016 PCP: Leanna Battles, MD Referred by: Leanna Battles, MD  Subjective: Chief Complaint  Patient presents with  . Lower Back - Pain  . Right Hip - Pain  . Left Leg - Pain, Numbness  . Right Leg - Pain, Numbness   HPI: Robert Mcknight is a very pleasant 83 year old gentleman who lives in [place] but comes in today at the request for consultation by Dr. Janie Morning.  Robert Mcknight endorses many years of low back pain and in particular right-sided low back pain.  There is had increasing pain for over 3 weeks.  He describes this pain as a catching pain on the right side but it will radiate down into the right leg and somewhat of an L4 and L5 distribution.  He does feel like he can get pain down both legs with some numbness and weakness at times.  He reports this is a sharp stabbing pain bilateral knees more than to the feet.  Again more symptoms on the right than left.  He does not endorse any specific injury.  He has had no focal weakness but is felt weak.  He has had no bowel or bladder is.  He has had no fevers chills or night sweats.  He has had no unexplained weight loss.  He has having a hard time trying to rest at night.  He was taking Aleve initially this was not helping.  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  He does not carry a diagnosis of polyneuropathy.  He was initially started on meloxicam as well as Flexeril.  He is also given a Solu-Medrol injection hip musculature.  He reports the injections seem to help quite a bit.  Lumbar spine MRI was obtained and reviewed below.  The MRI basically shows moderate multifactorial stenosis at L4-5 as well as a very small listhesis of L5 on S1 with some bilateral foraminal narrowing.  Either 1 of these could probably cause some of his symptoms.  After the MRI he was started on a Medrol taper and is finishing up with  this now.    Review of Systems  Constitutional: Negative for chills, fever, malaise/fatigue and weight loss.  HENT: Negative for hearing loss and sinus pain.   Eyes: Negative for blurred vision, double vision and photophobia.  Respiratory: Negative for cough and shortness of breath.   Cardiovascular: Negative for chest pain, palpitations and leg swelling.  Gastrointestinal: Negative for abdominal pain, nausea and vomiting.  Genitourinary: Negative for flank pain.  Musculoskeletal: Positive for back pain. Negative for myalgias.       Bilateral right more than left hip and leg pain  Skin: Negative for itching and rash.  Neurological: Positive for tingling. Negative for tremors, focal weakness and weakness.  Endo/Heme/Allergies: Negative.   Psychiatric/Behavioral: Negative for depression.  All other systems reviewed and are negative.  Otherwise per HPI.  Assessment & Plan: Visit Diagnoses:  1. Lumbar radiculopathy   2. Spinal stenosis of lumbar region without neurogenic claudication     Plan: Findings:  Chronic history of back pain which is likely related to facet arthropathy and degenerative changes but also now he is having symptoms likely related to the moderate central stenosis and some foraminal stenosis.  He is getting more right radicular pain but he is doing well overall currently.  He has had a Solu-Medrol intramuscular injection as  well as a steroid taper.  He is a type II diabetic.  He has been watching his blood sugars.  Overall the pain has subsided since it first started a few weeks ago.  He is still having pain but is not nearly as bad as it was.  When asked if he can function the way he is right now he said he could and he is doing okay.  We had a long talk today about the natural history of lumbar stenosis and facet back pain as well as foraminal stenosis.  We talked about the use of surgery in this instance as well as the use of injections.  I think at this point given the  amount of time that it has been flared up has been very short we should play this out and see how he does over the next few weeks.  He will continue to take the anti-inflammatory after the Medrol.  If his symptoms seem to rebound or be exacerbated I would look at either an interlaminar or transforaminal epidural steroid injection for the more nerve component.  Eventually we could look at something like lumbar radiofrequency ablation of the facet joints.  We have told him to remain active and moving.  He would undoubtedly not be a great surgical candidate.  But he does have some osteoporosis as well as the type 2 diabetes.  Right now we will have a follow-up he is to call us in a few weeks if his symptoms recur or are persistent.  Again we would look at interventional procedure at that point.      Meds & Orders: No orders of the defined types were placed in this encounter.  No orders of the defined types were placed in this encounter.   Follow-up: Return if symptoms worsen or fail to improve.   Procedures: No procedures performed  No notes on file   Clinical History: Lumbar spine MRI dated 10/16/2016 this was completed in [place]  By report there is mottled bone marrow likely from osteoporosis.  40%.  Endplate compression fracture at T12 which is old without signal intensity on STIR images.  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  L3-4 shows mild to moderate disc space loss with circumferential osteophyte disc bulge more focal in the foramen but with no significant spinal or foraminal stenosis.  There is mild facet arthropathy.  L4-5 again shows moderate disc space narrowing and significant osteophyte formation with disc bulge and moderate facet arthropathy.  This produces moderate central canal stenosis and moderate right foraminal stenosis without focal nerve root impingement.  L5-S1 shows 2 mm anterior listhesis with severe facet joint arthritis.  Moderate diffuse bulging of  the disc.  There is no significant spinal stenosis but moderate bilateral foraminal stenosis.  There is some loss of the epidural fat suggesting some nerve root impingement at L5-S1.  He reports that he has never smoked. He has never used smokeless tobacco. No results for input(s): HGBA1C, LABURIC in the last 8760 hours.  Objective:  VS:  HT:    WT:   BMI:     BP:139/76  HR: bpm  TEMP:97.8 F (36.6 C)( )  RESP:  Physical Exam  Constitutional: He is oriented to person, place, and time. He appears well-developed and well-nourished. No distress.  HENT:  Head: Normocephalic and atraumatic.  Nose: Nose normal.  Mouth/Throat: Oropharynx is clear and moist.  Eyes: Pupils are equal, round, and reactive to light. Conjunctivae are normal.  Neck: Normal  range of motion. Neck supple.  Cardiovascular: Regular rhythm and intact distal pulses.   Pulmonary/Chest: Effort normal and breath sounds normal.  Abdominal: Soft. He exhibits no distension.  Musculoskeletal: He exhibits no deformity.  Patient ambulates with a forward flexed or spine.  He has difficulty arising from a seated position.  He has pain with extension of the lumbar spine.  He has no pain with hip rotation internal or external.  He has mild pain over the greater trochanters.  He has good distal strength without clonus.  Neurological: He is alert and oriented to person, place, and time. He exhibits normal muscle tone. Coordination normal.  Skin: Skin is warm. No rash noted.  Psychiatric: He has a normal mood and affect. His behavior is normal.  Nursing note and vitals reviewed.   Ortho Exam Imaging: No results found.  Past Medical/Family/Surgical/Social History: Medications & Allergies reviewed per EMR Patient Active Problem List   Diagnosis Date Noted  . SVT (supraventricular tachycardia) ([place])   . Aortic stenosis, mild   . Paroxysmal SVT (supraventricular tachycardia) ([place]) 02/27/2015  . Hypokalemia 02/26/2015  .  Tachyarrhythmia 02/25/2015  . Chest pain 02/25/2015  . 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 Bradycardia 03/24/2013    Class: Acute  . Obstructive sleep apnea 03/24/2013    Class: Chronic  . Type II diabetes mellitus ([place]) 03/22/2013  . Essential hypertension, benign 03/22/2013  . Other and unspecified hyperlipidemia 03/22/2013  . Diverticular hemorrhage 03/20/2013   Past Medical History:  Diagnosis Date  . Arthritis   . Colon polyps   . Diabetes mellitus   . GERD (gastroesophageal reflux disease)   . Heart murmur   . Hyperlipemia   . Hypertension   . Prostate cancer ([place]) 2011  . Seasonal allergies   . Sleep apnea    Family History  Problem Relation Age of Onset  . Colon cancer Brother 99  . Colon cancer Brother   . Stomach cancer Neg Hx    Past Surgical History:  Procedure Laterality Date  . CARDIAC CATHETERIZATION N/A 02/26/2015   Procedure: Left Heart Cath and Coronary Angiography;  Surgeon: Belva Crome, MD;  Location: [facility];  Service: Cardiovascular;  Laterality: N/A;  . ELECTROPHYSIOLOGIC STUDY N/A 05/03/2015   Procedure: SVT Ablation;  Surgeon: Will Meredith Leeds, MD;  Location: [facility];  Service: Cardiovascular;  Laterality: N/A;  . FLEXIBLE SIGMOIDOSCOPY N/A 03/23/2013   Procedure: FLEXIBLE SIGMOIDOSCOPY;  Surgeon: Beryle Beams, MD;  Location: [facility];  Service: Endoscopy;  Laterality: N/A;  . INGUINAL HERNIA REPAIR  1992   left  . NASAL SINUS SURGERY  02/01/15  . TRANSURETHRAL RESECTION OF PROSTATE     Social History   Occupational History  . Not on file.   Social History Main Topics  . Smoking status: Never Smoker  . Smokeless tobacco: Never Used  . Alcohol use No  . Drug use: No  . Sexual activity: No

## 2016-11-21 NOTE — Progress Notes (Signed)
Assessment and plans reviewed  

## 2016-12-01 ENCOUNTER — Other Ambulatory Visit: Payer: Self-pay

## 2016-12-01 ENCOUNTER — Other Ambulatory Visit (INDEPENDENT_AMBULATORY_CARE_PROVIDER_SITE_OTHER): Payer: Medicare Other

## 2016-12-01 DIAGNOSIS — D500 Iron deficiency anemia secondary to blood loss (chronic): Secondary | ICD-10-CM

## 2016-12-01 LAB — CBC WITH DIFFERENTIAL/PLATELET
BASOS ABS: 0.1 10*3/uL (ref 0.0–0.1)
Basophils Relative: 0.9 % (ref 0.0–3.0)
EOS PCT: 5.3 % — AB (ref 0.0–5.0)
Eosinophils Absolute: 0.5 10*3/uL (ref 0.0–0.7)
HEMATOCRIT: 33.2 % — AB (ref 39.0–52.0)
HEMOGLOBIN: 10.6 g/dL — AB (ref 13.0–17.0)
LYMPHS ABS: 2 10*3/uL (ref 0.7–4.0)
LYMPHS PCT: 22.6 % (ref 12.0–46.0)
MCHC: 31.9 g/dL (ref 30.0–36.0)
MCV: 80 fl (ref 78.0–100.0)
MONOS PCT: 10.5 % (ref 3.0–12.0)
Monocytes Absolute: 0.9 10*3/uL (ref 0.1–1.0)
Neutro Abs: 5.4 10*3/uL (ref 1.4–7.7)
Neutrophils Relative %: 60.7 % (ref 43.0–77.0)
Platelets: 266 10*3/uL (ref 150.0–400.0)
RBC: 4.16 Mil/uL — AB (ref 4.22–5.81)
RDW: 18.4 % — ABNORMAL HIGH (ref 11.5–15.5)
WBC: 8.8 10*3/uL (ref 4.0–10.5)

## 2016-12-08 ENCOUNTER — Ambulatory Visit: Payer: Medicare Other | Admitting: Urology

## 2016-12-08 DIAGNOSIS — N401 Enlarged prostate with lower urinary tract symptoms: Secondary | ICD-10-CM | POA: Diagnosis not present

## 2016-12-08 DIAGNOSIS — N3941 Urge incontinence: Secondary | ICD-10-CM | POA: Diagnosis not present

## 2016-12-08 DIAGNOSIS — N3040 Irradiation cystitis without hematuria: Secondary | ICD-10-CM | POA: Diagnosis not present

## 2016-12-08 DIAGNOSIS — Z8546 Personal history of malignant neoplasm of prostate: Secondary | ICD-10-CM

## 2016-12-15 ENCOUNTER — Other Ambulatory Visit: Payer: Self-pay | Admitting: Cardiology

## 2016-12-18 NOTE — Telephone Encounter (Signed)
This is Dr. Evette Georges pt. Dr. Claiborne Billings is the primary cardiologist and pt is in need of a yearly appt. Please address

## 2016-12-18 NOTE — Telephone Encounter (Signed)
REFILL 

## 2016-12-22 HISTORY — PX: MOUTH SURGERY: SHX715

## 2017-01-01 ENCOUNTER — Encounter: Payer: Self-pay | Admitting: Internal Medicine

## 2017-01-04 ENCOUNTER — Telehealth: Payer: Self-pay | Admitting: Internal Medicine

## 2017-01-04 NOTE — Telephone Encounter (Signed)
Pt is calling back because he has a procedure scheduled for tomorrow and the dentist started him on Amoxicillin. Is asking if he can still have his procedure

## 2017-01-04 NOTE — Telephone Encounter (Signed)
Spoke with pt and let him know he should be fine to have procedure while taking the medication. Pt verbalized understanding.

## 2017-01-04 NOTE — Telephone Encounter (Signed)
Returned patients call. Patient saw his dentist yesterday and started taking amoxicillin. He is asking if he can still have his procedure tomorrow. Patient was informed that he could still continue with scheduled procedure.   Robert Sheer, LPN

## 2017-01-05 ENCOUNTER — Encounter: Payer: Self-pay | Admitting: Internal Medicine

## 2017-01-05 ENCOUNTER — Ambulatory Visit (AMBULATORY_SURGERY_CENTER): Payer: Medicare Other | Admitting: Internal Medicine

## 2017-01-05 ENCOUNTER — Other Ambulatory Visit: Payer: Self-pay

## 2017-01-05 VITALS — BP 148/70 | HR 59 | Temp 99.1°F | Resp 17 | Ht 69.0 in | Wt 204.0 lb

## 2017-01-05 DIAGNOSIS — K5731 Diverticulosis of large intestine without perforation or abscess with bleeding: Secondary | ICD-10-CM

## 2017-01-05 DIAGNOSIS — Z1211 Encounter for screening for malignant neoplasm of colon: Secondary | ICD-10-CM

## 2017-01-05 DIAGNOSIS — K635 Polyp of colon: Secondary | ICD-10-CM

## 2017-01-05 DIAGNOSIS — Z86010 Personal history of colonic polyps: Secondary | ICD-10-CM

## 2017-01-05 DIAGNOSIS — K625 Hemorrhage of anus and rectum: Secondary | ICD-10-CM

## 2017-01-05 DIAGNOSIS — D125 Benign neoplasm of sigmoid colon: Secondary | ICD-10-CM

## 2017-01-05 DIAGNOSIS — Z860101 Personal history of adenomatous and serrated colon polyps: Secondary | ICD-10-CM

## 2017-01-05 DIAGNOSIS — D509 Iron deficiency anemia, unspecified: Secondary | ICD-10-CM | POA: Insufficient documentation

## 2017-01-05 MED ORDER — SODIUM CHLORIDE 0.9 % IV SOLN: 500.0000 mL | INTRAVENOUS | Status: DC

## 2017-01-05 NOTE — Progress Notes (Signed)
A and O x3. Report to RN. Tolerated MAC anesthesia well.

## 2017-01-05 NOTE — Patient Instructions (Signed)
YOU HAD AN ENDOSCOPIC PROCEDURE TODAY AT [facility]:   Refer to the procedure report that was given to you for any specific questions about what was found during the examination.  If the procedure report does not answer your questions, please call your gastroenterologist to clarify.  If you requested that your care partner not be given the details of your procedure findings, then the procedure report has been included in a sealed envelope for you to review at your convenience later.  YOU SHOULD EXPECT: Some feelings of bloating in the abdomen. Passage of more gas than usual.  Walking can help get rid of the air that was put into your GI tract during the procedure and reduce the bloating. If you had a lower endoscopy (such as a colonoscopy or flexible sigmoidoscopy) you may notice spotting of blood in your stool or on the toilet paper. If you underwent a bowel prep for your procedure, you may not have a normal bowel movement for a few days.  Please Note:  You might notice some irritation and congestion in your nose or some drainage.  This is from the oxygen used during your procedure.  There is no need for concern and it should clear up in a day or so.  SYMPTOMS TO REPORT IMMEDIATELY:   Following lower endoscopy (colonoscopy or flexible sigmoidoscopy):  Excessive amounts of blood in the stool  Significant tenderness or worsening of abdominal pains  Swelling of the abdomen that is new, acute  Fever of 100F or higher   For urgent or emergent issues, a gastroenterologist can be reached at any hour by calling [phone].   DIET:  We do recommend a small meal at first, but then you may proceed to your regular diet.  Drink plenty of fluids but you should avoid alcoholic beverages for 24 hours.  ACTIVITY:  You should plan to take it easy for the rest of today and you should NOT DRIVE or use heavy machinery until tomorrow (because of the sedation medicines used during the test).     FOLLOW UP: Our staff will call the number listed on your records the next business day following your procedure to check on you and address any questions or concerns that you may have regarding the information given to you following your procedure. If we do not reach you, we will leave a message.  However, if you are feeling well and you are not experiencing any problems, there is no need to return our call.  We will assume that you have returned to your regular daily activities without incident.  If any biopsies were taken you will be contacted by phone or by letter within the next 1-3 weeks.  Please call us at [phone] if you have not heard about the biopsies in 3 weeks.    SIGNATURES/CONFIDENTIALITY: You and/or your care partner have signed paperwork which will be entered into your electronic medical record.  These signatures attest to the fact that that the information above on your After Visit Summary has been reviewed and is understood.  Full responsibility of the confidentiality of this discharge information lies with you and/or your care-partner.  Hemorrhoid information given. Diverticulosis information given.

## 2017-01-05 NOTE — Op Note (Signed)
[facility] Patient Name: Robert Mcknight Procedure Date: 01/05/2017 2:49 PM MRN: [id number] Endoscopist: Docia Chuck. Henrene Pastor , MD Age: 83 Referring MD:  Date of Birth: 09-09-1933 Gender: Male Account #: [id number] Procedure:                Colonoscopy, with cold snare polypectomy x 1 Indications:              Rectal bleeding. Patient has a history of                            adenomatous colon polyps on colonoscopy in 2012.Has                            a history of diverticular bleeding with flexible                            sigmoidoscopy in the hospital 2015 Medicines:                Monitored Anesthesia Care Procedure:                Pre-Anesthesia Assessment:                           - Prior to the procedure, a History and Physical                            was performed, and patient medications and                            allergies were reviewed. The patient's tolerance of                            previous anesthesia was also reviewed. The risks                            and benefits of the procedure and the sedation                            options and risks were discussed with the patient.                            All questions were answered, and informed consent                            was obtained. Prior Anticoagulants: The patient has                            taken no previous anticoagulant or antiplatelet                            agents. ASA Grade Assessment: II - A patient with                            mild systemic disease. After reviewing the risks  and benefits, the patient was deemed in                            satisfactory condition to undergo the procedure.                           After obtaining informed consent, the colonoscope                            was passed under direct vision. Throughout the                            procedure, the patient's blood pressure, pulse, and   oxygen saturations were monitored continuously. The                            Colonoscope was introduced through the anus and                            advanced to the the cecum, identified by                            appendiceal orifice and ileocecal valve. The                            ileocecal valve, appendiceal orifice, and rectum                            were photographed. The quality of the bowel                            preparation was excellent. The colonoscopy was                            performed without difficulty. The patient tolerated                            the procedure well. The bowel preparation used was                            SUPREP. Scope In: 2:57:38 PM Scope Out: 3:10:49 PM Scope Withdrawal Time: 0 hours 9 minutes 50 seconds  Total Procedure Duration: 0 hours 13 minutes 11 seconds  Findings:                 Multiple small and large-mouthed diverticula were                            found in the transverse colon and left colon.                           Internal hemorrhoids were found during                            retroflexion. The hemorrhoids were small.  The exam was otherwise without abnormality on                            direct and retroflexion views. Complications:            No immediate complications. Estimated blood loss:                            None. Estimated Blood Loss:     Estimated blood loss: none. Impression:               - Diverticulosis in the transverse colon and in the                            left colon.                           - Internal hemorrhoids.                           - The examination was otherwise normal on direct                            and retroflexion views.                           - No specimens collected. Recommendation:           - Repeat colonoscopy is not recommended for                            surveillance.                           - Patient has a contact  number available for                            emergencies. The signs and symptoms of potential                            delayed complications were discussed with the                            patient. Return to normal activities tomorrow.                            Written discharge instructions were provided to the                            patient.                           - Resume previous diet.                           - Continue present medications.                           -  Await pathology results. Docia Chuck. Henrene Pastor, MD 01/05/2017 3:14:59 PM This report has been signed electronically.

## 2017-01-05 NOTE — Progress Notes (Signed)
Called to room to assist during endoscopic procedure.  Patient ID and intended procedure confirmed with present staff. Received instructions for my participation in the procedure from the performing physician.  

## 2017-01-08 ENCOUNTER — Telehealth: Payer: Self-pay | Admitting: *Deleted

## 2017-01-08 NOTE — Telephone Encounter (Signed)
  Follow up Call-  Call back number 01/05/2017  Post procedure Call Back phone  # [phone]  Permission to leave phone message Yes  Some recent data might be hidden     Patient questions:  Do you have a fever, pain , or abdominal swelling? No. Pain Score  0 *  Have you tolerated food without any problems? Yes.    Have you been able to return to your normal activities? Yes.    Do you have any questions about your discharge instructions: Diet   No. Medications  No. Follow up visit  No.  Do you have questions or concerns about your Care? No.  Actions: * If pain score is 4 or above: No action needed, pain <4.

## 2017-01-11 ENCOUNTER — Other Ambulatory Visit: Payer: Self-pay | Admitting: Cardiology

## 2017-01-11 ENCOUNTER — Encounter: Payer: Self-pay | Admitting: Internal Medicine

## 2017-01-13 ENCOUNTER — Other Ambulatory Visit: Payer: Self-pay | Admitting: Cardiology

## 2017-01-25 ENCOUNTER — Encounter: Payer: BLUE CROSS/BLUE SHIELD | Admitting: Internal Medicine

## 2017-06-14 ENCOUNTER — Other Ambulatory Visit (HOSPITAL_COMMUNITY): Payer: Self-pay | Admitting: Internal Medicine

## 2017-06-14 DIAGNOSIS — R0989 Other specified symptoms and signs involving the circulatory and respiratory systems: Secondary | ICD-10-CM

## 2017-06-15 ENCOUNTER — Ambulatory Visit: Payer: Medicare Other | Admitting: Urology

## 2017-06-15 DIAGNOSIS — N401 Enlarged prostate with lower urinary tract symptoms: Secondary | ICD-10-CM

## 2017-06-15 DIAGNOSIS — Z8546 Personal history of malignant neoplasm of prostate: Secondary | ICD-10-CM | POA: Diagnosis not present

## 2017-06-15 DIAGNOSIS — N3941 Urge incontinence: Secondary | ICD-10-CM

## 2017-06-19 ENCOUNTER — Ambulatory Visit (HOSPITAL_COMMUNITY)
Admission: RE | Admit: 2017-06-19 | Discharge: 2017-06-19 | Disposition: A | Discharge status: Home / Self Care | Payer: Medicare Other | Source: Ambulatory Visit | Attending: Vascular Surgery | Admitting: Vascular Surgery

## 2017-06-19 DIAGNOSIS — R0989 Other specified symptoms and signs involving the circulatory and respiratory systems: Secondary | ICD-10-CM | POA: Insufficient documentation

## 2017-07-15 ENCOUNTER — Other Ambulatory Visit: Payer: Self-pay | Admitting: Cardiology

## 2017-07-27 ENCOUNTER — Other Ambulatory Visit: Payer: Self-pay | Admitting: Cardiology

## 2017-08-19 ENCOUNTER — Other Ambulatory Visit: Payer: Self-pay | Admitting: Cardiology

## 2017-11-20 ENCOUNTER — Ambulatory Visit: Payer: Medicare Other | Admitting: Internal Medicine

## 2017-11-20 ENCOUNTER — Encounter: Payer: Self-pay | Admitting: Internal Medicine

## 2017-11-20 VITALS — BP 158/78 | HR 76 | Ht 67.5 in | Wt 198.2 lb

## 2017-11-20 DIAGNOSIS — K635 Polyp of colon: Secondary | ICD-10-CM

## 2017-11-20 DIAGNOSIS — D649 Anemia, unspecified: Secondary | ICD-10-CM

## 2017-11-20 DIAGNOSIS — K5731 Diverticulosis of large intestine without perforation or abscess with bleeding: Secondary | ICD-10-CM | POA: Diagnosis not present

## 2017-11-20 DIAGNOSIS — K219 Gastro-esophageal reflux disease without esophagitis: Secondary | ICD-10-CM | POA: Diagnosis not present

## 2017-11-20 DIAGNOSIS — D125 Benign neoplasm of sigmoid colon: Secondary | ICD-10-CM

## 2017-11-20 DIAGNOSIS — D500 Iron deficiency anemia secondary to blood loss (chronic): Secondary | ICD-10-CM | POA: Diagnosis not present

## 2017-11-20 NOTE — Progress Notes (Signed)
HISTORY OF PRESENT ILLNESS:  Robert Mcknight is a 84 y.o. male with past medical history as listed below who was sent today by his primary care provider Dr. Philip Mcknight regarding iron deficiency (question anemia) and the need for upper endoscopy.  The patient has a history of adenomatous colon polyps and has undergone colonoscopy on several occasions in the past.  His last examination was January 05, 2017.  He was found to have diverticulosis and internal hemorrhoids.  A diminutive adenoma was removed.  No follow-up recommended secondary to age.  He is sent today by his primary care provider Dr. Sharlett Mcknight regarding iron deficiency anemia.  I have requested outside blood work.  Iron saturation is low at 7%.  Ferritin is low at 15.9.  I do not see a hemoglobin.  Hemoccult testing was negative.  Patient does have chronic reflux symptoms for which she takes omeprazole 20 mg daily.  Symptoms are well controlled.  He is not a very good historian.  He has meloxicam and aspirin on his medication list.  Other GI complaints include belching.  No relevant x-ray abnormalities noted  REVIEW OF SYSTEMS:  All non-GI ROS negative unless otherwise stated in the HPI except for muscle cramps, arthritis, back pain, urinary leakage  Past Medical History:  Diagnosis Date  . Arthritis   . Colon polyps   . Dental infection 10/2016  . Diabetes mellitus   . GERD (gastroesophageal reflux disease)   . Heart murmur   . Hyperlipemia   . Hypertension   . Prostate cancer ([place]) 2011  . Seasonal allergies   . Sleep apnea     Past Surgical History:  Procedure Laterality Date  . CARDIAC CATHETERIZATION N/A 02/26/2015   Procedure: Left Heart Cath and Coronary Angiography;  Surgeon: Belva Crome, MD;  Location: [facility];  Service: Cardiovascular;  Laterality: N/A;  . ELECTROPHYSIOLOGIC STUDY N/A 05/03/2015   Procedure: SVT Ablation;  Surgeon: Will Meredith Leeds, MD;  Location: [facility];  Service:  Cardiovascular;  Laterality: N/A;  . FLEXIBLE SIGMOIDOSCOPY N/A 03/23/2013   Procedure: FLEXIBLE SIGMOIDOSCOPY;  Surgeon: Beryle Beams, MD;  Location: [facility];  Service: Endoscopy;  Laterality: N/A;  . INGUINAL HERNIA REPAIR  1992   left  . MOUTH SURGERY Left 12/22/2016   Top  . NASAL SINUS SURGERY  02/01/15  . TRANSURETHRAL RESECTION OF PROSTATE      Social History Robert Mcknight  reports that he has never smoked. He has never used smokeless tobacco. He reports that he does not drink alcohol or use drugs.  family history includes Colon cancer in his brother; Colon cancer (age of onset: 80) in his brother.  Allergies  Allergen Reactions  . Pollen Extract Other (See Comments)    Runny nose, congestion, cough       PHYSICAL EXAMINATION: Vital signs: BP (!) 158/78 (BP Location: Left Arm, Patient Position: Sitting, Cuff Size: Normal)   Pulse 76 Comment: irregular  Ht 5' 7.5" (1.715 m) Comment: height measured without shoes  Wt 198 lb 4 oz (89.9 kg)   BMI 30.59 kg/m   Constitutional: generally well-appearing, no acute distress Psychiatric: alert and oriented x3, cooperative Eyes: extraocular movements intact, anicteric, conjunctiva pink Mouth: oral pharynx moist, no lesions Neck: supple no lymphadenopathy Cardiovascular: heart regular rate and rhythm, no murmur Lungs: clear to auscultation bilaterally Abdomen: soft, nontender, nondistended, no obvious ascites, no peritoneal signs, normal bowel sounds, no organomegaly Rectal: Omitted Extremities: no clubbing, cyanosis, or lower  extremity edema bilaterally Skin: no lesions on visible extremities Neuro: No focal deficits.  Cranial nerves intact  ASSESSMENT:  1.  Iron deficiency 2.  History of adenomatous colon polyps.  Last colonoscopy 10 months ago 3.  Chronic GERD 4.  Chronic NSAID use 5.  Diabetes mellitus  PLAN:  1.  Upper endoscopy to rule out ulcer disease.The nature of the procedure, as well as the risks,  benefits, and alternatives were carefully and thoroughly reviewed with the patient. Ample time for discussion and questions allowed. The patient understood, was satisfied, and agreed to proceed. 2.  May need iron supplementation 3.  Take PPI regularly 4.  No plans for repeat colonoscopy at this time 5.  Hold diabetic medications the day of the procedure to avoid unwanted hypoglycemia

## 2017-11-20 NOTE — Patient Instructions (Signed)

## 2017-11-23 ENCOUNTER — Encounter: Payer: Self-pay | Admitting: Internal Medicine

## 2017-11-23 ENCOUNTER — Other Ambulatory Visit: Payer: Self-pay

## 2017-11-23 ENCOUNTER — Other Ambulatory Visit (INDEPENDENT_AMBULATORY_CARE_PROVIDER_SITE_OTHER): Payer: Medicare Other

## 2017-11-23 ENCOUNTER — Ambulatory Visit (AMBULATORY_SURGERY_CENTER): Payer: Medicare Other | Admitting: Internal Medicine

## 2017-11-23 VITALS — BP 147/72 | HR 63 | Temp 98.4°F | Resp 12 | Ht 67.5 in | Wt 198.0 lb

## 2017-11-23 DIAGNOSIS — K317 Polyp of stomach and duodenum: Secondary | ICD-10-CM

## 2017-11-23 DIAGNOSIS — D500 Iron deficiency anemia secondary to blood loss (chronic): Secondary | ICD-10-CM

## 2017-11-23 DIAGNOSIS — D649 Anemia, unspecified: Secondary | ICD-10-CM

## 2017-11-23 DIAGNOSIS — K219 Gastro-esophageal reflux disease without esophagitis: Secondary | ICD-10-CM | POA: Diagnosis not present

## 2017-11-23 LAB — CBC WITH DIFFERENTIAL/PLATELET
BASOS ABS: 0 10*3/uL (ref 0.0–0.1)
Basophils Relative: 0.7 % (ref 0.0–3.0)
EOS ABS: 0.3 10*3/uL (ref 0.0–0.7)
Eosinophils Relative: 4.6 % (ref 0.0–5.0)
HCT: 29.4 % — ABNORMAL LOW (ref 39.0–52.0)
HEMOGLOBIN: 9.3 g/dL — AB (ref 13.0–17.0)
LYMPHS ABS: 1.7 10*3/uL (ref 0.7–4.0)
Lymphocytes Relative: 27.5 % (ref 12.0–46.0)
MCHC: 31.6 g/dL (ref 30.0–36.0)
MCV: 69.6 fl — ABNORMAL LOW (ref 78.0–100.0)
Monocytes Absolute: 0.7 10*3/uL (ref 0.1–1.0)
Monocytes Relative: 10.8 % (ref 3.0–12.0)
Neutro Abs: 3.6 10*3/uL (ref 1.4–7.7)
Neutrophils Relative %: 56.4 % (ref 43.0–77.0)
Platelets: 191 10*3/uL (ref 150.0–400.0)
RBC: 4.23 Mil/uL (ref 4.22–5.81)
RDW: 20.7 % — ABNORMAL HIGH (ref 11.5–15.5)
WBC: 6.3 10*3/uL (ref 4.0–10.5)

## 2017-11-23 LAB — FERRITIN: FERRITIN: 8.2 ng/mL — AB (ref 22.0–322.0)

## 2017-11-23 MED ORDER — SODIUM CHLORIDE 0.9 % IV SOLN: 500.0000 mL | Freq: Once | INTRAVENOUS | Status: AC

## 2017-11-23 NOTE — Op Note (Signed)
[place] Patient Name: Robert Mcknight Procedure Date: 11/23/2017 2:58 PM MRN: [id number] Endoscopist: Docia Chuck. Henrene Pastor , MD Age: 84 Referring MD:  Date of Birth: 1933-07-01 Gender: Male Account #: [id number] Procedure:                Upper GI endoscopy with biopsies Indications:              Iron deficiency anemia Medicines:                Monitored Anesthesia Care Procedure:                Pre-Anesthesia Assessment:                           - Prior to the procedure, a History and Physical                            was performed, and patient medications and                            allergies were reviewed. The patient's tolerance of                            previous anesthesia was also reviewed. The risks                            and benefits of the procedure and the sedation                            options and risks were discussed with the patient.                            All questions were answered, and informed consent                            was obtained. Prior Anticoagulants: The patient has                            taken no previous anticoagulant or antiplatelet                            agents. ASA Grade Assessment: II - A patient with                            mild systemic disease. After reviewing the risks                            and benefits, the patient was deemed in                            satisfactory condition to undergo the procedure.                           After obtaining informed consent, the endoscope was  passed under direct vision. Throughout the                            procedure, the patient's blood pressure, pulse, and                            oxygen saturations were monitored continuously. The                            Endoscope was introduced through the mouth, and                            advanced to the second part of duodenum. The upper                            GI endoscopy was  accomplished without difficulty.                            The patient tolerated the procedure well. Scope In: Scope Out: Findings:                 The esophagus was normal.                           The stomach revealed multiple polyps all less than                            1 cm consistent with benign fundic gland polyps.                            The stomach was otherwise normal. Biopsies were                            taken with a cold forceps for histology.                           The examined duodenum was normal.                           The cardia and gastric fundus were normal on                            retroflexion. Complications:            No immediate complications. Estimated Blood Loss:     Estimated blood loss: none. Impression:               1. Incidental small gastric polyps. Otherwise                            normal exam. No ulcers or AVMs                           2. No obvious cause for iron deficiency found Recommendation:           1. CBC and ferritin level today  2. May need iron supplementation based on blood                            work results                           3. Follow-up biopsies. Docia Chuck. Henrene Pastor, MD 11/23/2017 3:16:27 PM This report has been signed electronically.

## 2017-11-23 NOTE — Progress Notes (Signed)
PT taken to PACU. Monitors in place. VSS. Report given to RN. 

## 2017-11-23 NOTE — Progress Notes (Signed)
Called to room to assist during endoscopic procedure.  Patient ID and intended procedure confirmed with present staff. Received instructions for my participation in the procedure from the performing physician.  

## 2017-11-23 NOTE — Patient Instructions (Signed)
Continue present medications. Await pathology results and lab results.   YOU HAD AN ENDOSCOPIC PROCEDURE TODAY AT [facility]:   Refer to the procedure report that was given to you for any specific questions about what was found during the examination.  If the procedure report does not answer your questions, please call your gastroenterologist to clarify.  If you requested that your care partner not be given the details of your procedure findings, then the procedure report has been included in a sealed envelope for you to review at your convenience later.  YOU SHOULD EXPECT: Some feelings of bloating in the abdomen. Passage of more gas than usual.  Walking can help get rid of the air that was put into your GI tract during the procedure and reduce the bloating. If you had a lower endoscopy (such as a colonoscopy or flexible sigmoidoscopy) you may notice spotting of blood in your stool or on the toilet paper. If you underwent a bowel prep for your procedure, you may not have a normal bowel movement for a few days.  Please Note:  You might notice some irritation and congestion in your nose or some drainage.  This is from the oxygen used during your procedure.  There is no need for concern and it should clear up in a day or so.  SYMPTOMS TO REPORT IMMEDIATELY:     Following upper endoscopy (EGD)  Vomiting of blood or coffee ground material  New chest pain or pain under the shoulder blades  Painful or persistently difficult swallowing  New shortness of breath  Fever of 100F or higher  Black, tarry-looking stools  For urgent or emergent issues, a gastroenterologist can be reached at any hour by calling [phone].   DIET:  We do recommend a small meal at first, but then you may proceed to your regular diet.  Drink plenty of fluids but you should avoid alcoholic beverages for 24 hours.  ACTIVITY:  You should plan to take it easy for the rest of today and you should NOT  DRIVE or use heavy machinery until tomorrow (because of the sedation medicines used during the test).    FOLLOW UP: Our staff will call the number listed on your records the next business day following your procedure to check on you and address any questions or concerns that you may have regarding the information given to you following your procedure. If we do not reach you, we will leave a message.  However, if you are feeling well and you are not experiencing any problems, there is no need to return our call.  We will assume that you have returned to your regular daily activities without incident.  If any biopsies were taken you will be contacted by phone or by letter within the next 1-3 weeks.  Please call us at [phone] if you have not heard about the biopsies in 3 weeks.    SIGNATURES/CONFIDENTIALITY: You and/or your care partner have signed paperwork which will be entered into your electronic medical record.  These signatures attest to the fact that that the information above on your After Visit Summary has been reviewed and is understood.  Full responsibility of the confidentiality of this discharge information lies with you and/or your care-partner.

## 2017-11-26 ENCOUNTER — Telehealth: Payer: Self-pay | Admitting: Internal Medicine

## 2017-11-26 ENCOUNTER — Telehealth: Payer: Self-pay

## 2017-11-26 NOTE — Telephone Encounter (Signed)
Discussed EGD report with pt and let him know the polyps were biopsied and we will let him know the results when they are available.

## 2017-11-26 NOTE — Telephone Encounter (Signed)
  Follow up Call-  Call back number 11/23/2017 01/05/2017  Post procedure Call Back phone  # [phone]  Permission to leave phone message Yes Yes  Some recent data might be hidden     Patient questions:  Do you have a fever, pain , or abdominal swelling? No. Pain Score  0 *  Have you tolerated food without any problems? Yes.    Have you been able to return to your normal activities? Yes.    Do you have any questions about your discharge instructions: Diet   No. Medications  No. Follow up visit  No.  Do you have questions or concerns about your Care? No.  Actions: * If pain score is 4 or above: No action needed, pain <4.

## 2017-11-27 ENCOUNTER — Telehealth: Payer: Self-pay | Admitting: Internal Medicine

## 2017-11-27 ENCOUNTER — Other Ambulatory Visit: Payer: Self-pay

## 2017-11-27 MED ORDER — FERROUS SULFATE 325 (65 FE) MG PO TABS: 325.0000 mg | ORAL_TABLET | Freq: Two times a day (BID) | ORAL | 3 refills | Status: DC

## 2017-11-27 NOTE — Telephone Encounter (Signed)
Pt calling regarding some medication that Dr. Henrene Pastor prescribed, he is confused about instructions.

## 2017-11-27 NOTE — Telephone Encounter (Signed)
See result note.  

## 2017-11-27 NOTE — Telephone Encounter (Signed)
Explained to pt that  Note has been sent to Dr. Henrene Pastor to see what he wants to do regarding the Iron since PCP called in something different. Let him know we will call him back as soon as we get clarification.

## 2017-11-27 NOTE — Telephone Encounter (Signed)
Patient has questions regarding medication he was supposed to be prescribed.

## 2017-11-28 ENCOUNTER — Telehealth: Payer: Self-pay | Admitting: Internal Medicine

## 2017-11-28 ENCOUNTER — Other Ambulatory Visit: Payer: Self-pay

## 2017-11-28 DIAGNOSIS — D509 Iron deficiency anemia, unspecified: Secondary | ICD-10-CM

## 2017-11-28 NOTE — Telephone Encounter (Signed)
Let pt know he will take it until next labs are drawn and then depending on the results of the labs he may need to continue longer.

## 2017-11-28 NOTE — Telephone Encounter (Signed)
Pt wants to know how long he needs to take ferrous sulfate.

## 2017-12-04 ENCOUNTER — Telehealth: Payer: Self-pay

## 2017-12-04 NOTE — Telephone Encounter (Signed)
Left message for pt that the iron he was placed on is causing the change in his stool color and for him to call back if he had any further questions.

## 2017-12-05 ENCOUNTER — Encounter: Payer: Self-pay | Admitting: Internal Medicine

## 2017-12-28 ENCOUNTER — Ambulatory Visit: Payer: Medicare Other | Admitting: Urology

## 2017-12-28 DIAGNOSIS — R9721 Rising PSA following treatment for malignant neoplasm of prostate: Secondary | ICD-10-CM

## 2017-12-28 DIAGNOSIS — N401 Enlarged prostate with lower urinary tract symptoms: Secondary | ICD-10-CM

## 2017-12-28 DIAGNOSIS — Z8546 Personal history of malignant neoplasm of prostate: Secondary | ICD-10-CM | POA: Diagnosis not present

## 2017-12-28 DIAGNOSIS — N3941 Urge incontinence: Secondary | ICD-10-CM | POA: Diagnosis not present

## 2018-01-07 ENCOUNTER — Other Ambulatory Visit (INDEPENDENT_AMBULATORY_CARE_PROVIDER_SITE_OTHER): Payer: Medicare Other

## 2018-01-07 DIAGNOSIS — D509 Iron deficiency anemia, unspecified: Secondary | ICD-10-CM | POA: Diagnosis not present

## 2018-01-07 LAB — CBC WITH DIFFERENTIAL/PLATELET
BASOS PCT: 1 % (ref 0.0–3.0)
Basophils Absolute: 0.1 10*3/uL (ref 0.0–0.1)
Eosinophils Absolute: 0.3 10*3/uL (ref 0.0–0.7)
Eosinophils Relative: 4.9 % (ref 0.0–5.0)
HCT: 38.9 % — ABNORMAL LOW (ref 39.0–52.0)
Hemoglobin: 12.4 g/dL — ABNORMAL LOW (ref 13.0–17.0)
LYMPHS ABS: 2.1 10*3/uL (ref 0.7–4.0)
Lymphocytes Relative: 30.6 % (ref 12.0–46.0)
MCHC: 31.9 g/dL (ref 30.0–36.0)
MCV: 76.3 fl — ABNORMAL LOW (ref 78.0–100.0)
MONO ABS: 0.6 10*3/uL (ref 0.1–1.0)
Monocytes Relative: 9.3 % (ref 3.0–12.0)
NEUTROS PCT: 54.2 % (ref 43.0–77.0)
Neutro Abs: 3.6 10*3/uL (ref 1.4–7.7)
Platelets: 191 10*3/uL (ref 150.0–400.0)
RBC: 5.1 Mil/uL (ref 4.22–5.81)
RDW: 27.8 % — AB (ref 11.5–15.5)
WBC: 6.7 10*3/uL (ref 4.0–10.5)

## 2018-01-07 LAB — FERRITIN: FERRITIN: 17.8 ng/mL — AB (ref 22.0–322.0)

## 2018-01-08 ENCOUNTER — Telehealth: Payer: Self-pay | Admitting: Internal Medicine

## 2018-01-08 NOTE — Telephone Encounter (Signed)
See result note.  

## 2018-01-08 NOTE — Telephone Encounter (Signed)
Pt called did advised pt of the msg that was left. About blood work however he is wanting to speak with the nurse about the lab results.

## 2018-01-08 NOTE — Telephone Encounter (Signed)
Left message for pt to call back  °

## 2018-07-12 ENCOUNTER — Ambulatory Visit (INDEPENDENT_AMBULATORY_CARE_PROVIDER_SITE_OTHER): Payer: Medicare Other | Admitting: Urology

## 2018-07-12 DIAGNOSIS — Z8546 Personal history of malignant neoplasm of prostate: Secondary | ICD-10-CM

## 2018-07-12 DIAGNOSIS — R350 Frequency of micturition: Secondary | ICD-10-CM

## 2018-07-12 DIAGNOSIS — R9721 Rising PSA following treatment for malignant neoplasm of prostate: Secondary | ICD-10-CM | POA: Diagnosis not present

## 2018-07-12 DIAGNOSIS — N481 Balanitis: Secondary | ICD-10-CM

## 2019-01-03 NOTE — Progress Notes (Signed)
CARDIOLOGY CONSULT NOTE       Patient ID: Robert Mcknight MRN: [id number] DOB/AGE: 10/09/1933 85 y.o.  Admit date: (Not on file) Referring Physician: Philip Aspen Primary Physician: Leanna Battles, MD Primary Cardiologist: Claiborne Billings Reason for Consultation: Aortic Stenosis  Active Problems:   * No active hospital problems. *   HPI:  85 y.o. previously seen by Dr Claiborne Billings and [facility] for SVT Ultimately required SVT ablation by EP on 05/03/15.Cath done By Dr Tamala Julian at that time showed normal coronary arteries   His last echo done 02/26/15 showed normal EF 60-65% with mild AS mean gradient 10 mmHg and peak 17 mmHg  CRF;s include HTN, HLD and DM His affect seems a bit slow and memory poor today but wife indicates he has been fine Has two older children in [place] with one grand child No angina Has some MSK pain in neck and arms  Go to sleep if in certain positions which sounds neuro in etiology akin to carpal tunnel    ROS All other systems reviewed and negative except as noted above  Past Medical History:  Diagnosis Date  . Arthritis   . Colon polyps   . Dental infection 10/2016  . Diabetes mellitus   . GERD (gastroesophageal reflux disease)   . Heart murmur   . Hyperlipemia   . Hypertension   . Prostate cancer ([place]) 2011  . Seasonal allergies   . Sleep apnea     Family History  Problem Relation Age of Onset  . Colon cancer Brother 63  . Colon cancer Brother   . Stomach cancer Neg Hx     Social History   Socioeconomic History  . Marital status: Married    Spouse name: Not on file  . Number of children: Not on file  . Years of education: Not on file  . Highest education level: Not on file  Occupational History  . Not on file  Tobacco Use  . Smoking status: Never Smoker  . Smokeless tobacco: Never Used  Substance and Sexual Activity  . Alcohol use: No  . Drug use: No  . Sexual activity: Never  Other Topics Concern  . Not on file  Social History Narrative  . Not on file    Social Determinants of Health   Financial Resource Strain:   . Difficulty of Paying Living Expenses: Not on file  Food Insecurity:   . Worried About Charity fundraiser in the Last Year: Not on file  . Ran Out of Food in the Last Year: Not on file  Transportation Needs:   . Lack of Transportation (Medical): Not on file  . Lack of Transportation (Non-Medical): Not on file  Physical Activity:   . Days of Exercise per Week: Not on file  . Minutes of Exercise per Session: Not on file  Stress:   . Feeling of Stress : Not on file  Social Connections:   . Frequency of Communication with Friends and Family: Not on file  . Frequency of Social Gatherings with Friends and Family: Not on file  . Attends Religious Services: Not on file  . Active Member of Clubs or Organizations: Not on file  . Attends Archivist Meetings: Not on file  . Marital Status: Not on file  Intimate Partner Violence:   . Fear of Current or Ex-Partner: Not on file  . Emotionally Abused: Not on file  . Physically Abused: Not on file  . Sexually Abused: Not on file  Past Surgical History:  Procedure Laterality Date  . CARDIAC CATHETERIZATION N/A 02/26/2015   Procedure: Left Heart Cath and Coronary Angiography;  Surgeon: Belva Crome, MD;  Location: [facility];  Service: Cardiovascular;  Laterality: N/A;  . ELECTROPHYSIOLOGIC STUDY N/A 05/03/2015   Procedure: SVT Ablation;  Surgeon: Will Meredith Leeds, MD;  Location: [facility];  Service: Cardiovascular;  Laterality: N/A;  . FLEXIBLE SIGMOIDOSCOPY N/A 03/23/2013   Procedure: FLEXIBLE SIGMOIDOSCOPY;  Surgeon: Beryle Beams, MD;  Location: [facility];  Service: Endoscopy;  Laterality: N/A;  . INGUINAL HERNIA REPAIR  1992   left  . MOUTH SURGERY Left 12/22/2016   Top  . NASAL SINUS SURGERY  02/01/15  . TRANSURETHRAL RESECTION OF PROSTATE     seeds implanted      . sodium chloride      Physical Exam: Blood pressure 134/72, pulse 66,  height 5' 7.5" (1.715 m), weight 182 lb 12.8 oz (82.9 kg), SpO2 99 %.   Affect appropriate Elderly black male  HEENT: normal Neck supple with no adenopathy JVP normal no bruits no thyromegaly Lungs clear with no wheezing and good diaphragmatic motion Heart:  S1/S2 AS  murmur, no rub, gallop or click PMI normal Abdomen: benighn, BS positve, no tenderness, no AAA no bruit.  No HSM or HJR Distal pulses intact with no bruits No edema Neuro non-focal Skin warm and dry No muscular weakness   Labs:   Lab Results  Component Value Date   WBC 6.7 01/07/2018   HGB 12.4 (L) 01/07/2018   HCT 38.9 (L) 01/07/2018   MCV 76.3 (L) 01/07/2018   PLT 191.0 01/07/2018   No results for input(s): NA, K, CL, CO2, BUN, CREATININE, CALCIUM, PROT, BILITOT, ALKPHOS, ALT, AST, GLUCOSE in the last 168 hours.  Invalid input(s): LABALBU Lab Results  Component Value Date   TROPONINI <0.03 02/26/2015    Lab Results  Component Value Date   CHOL 189 02/26/2015   Lab Results  Component Value Date   HDL 65 02/26/2015   Lab Results  Component Value Date   LDLCALC 112 (H) 02/26/2015   Lab Results  Component Value Date   TRIG 61 02/26/2015   Lab Results  Component Value Date   CHOLHDL 2.9 02/26/2015   No results found for: LDLDIRECT    Radiology: No results found.  EKG: SR rate 58 first degree 228 msec LVH 05/31/15 01/13/19 SR rate 66 normal PR 242    ASSESSMENT AND PLAN:   1. Aortic Stenosis:  Previously mild not severe on exam f/u echo 12/30 2. SVT:  Post ablation stable with no recurrence 3. SB: with first degree stable PR 242 msec on ECG today  4. HTN:  Well controlled.  Continue current medications and low sodium Dash type diet.   5. HLD: on statin labs with primary  6. DM:  On glumetza f/u labs with primary   Signed: Jenkins Rouge 01/13/2019, 9:31 AM

## 2019-01-07 ENCOUNTER — Telehealth: Payer: Self-pay

## 2019-01-07 DIAGNOSIS — I350 Nonrheumatic aortic (valve) stenosis: Secondary | ICD-10-CM

## 2019-01-07 NOTE — Telephone Encounter (Signed)
Placed order for echo.

## 2019-01-07 NOTE — Telephone Encounter (Signed)
-----   Message from Marcine Matar sent at 01/07/2019  8:53 AM EST ----- Can someone enter an order for me please   Robert Mcknight   ----- Message ----- From: Gretta Began Sent: 01/07/2019   8:30 AM EST To: Marcine Matar  Can you schedule this patient? ----- Message ----- From: Josue Hector, MD Sent: 01/03/2019   3:28 PM EST To: Lovell Sheehan, RN  New patient in 2 weeks see if we can get echo for AS next week

## 2019-01-08 ENCOUNTER — Other Ambulatory Visit (HOSPITAL_COMMUNITY): Payer: Medicare Other

## 2019-01-13 ENCOUNTER — Ambulatory Visit (INDEPENDENT_AMBULATORY_CARE_PROVIDER_SITE_OTHER): Payer: Medicare Other | Admitting: Cardiovascular Disease

## 2019-01-13 ENCOUNTER — Other Ambulatory Visit: Payer: Self-pay

## 2019-01-13 ENCOUNTER — Encounter: Payer: Self-pay | Admitting: Cardiovascular Disease

## 2019-01-13 VITALS — BP 134/72 | HR 66 | Ht 67.5 in | Wt 182.8 lb

## 2019-01-13 DIAGNOSIS — I350 Nonrheumatic aortic (valve) stenosis: Secondary | ICD-10-CM

## 2019-01-13 NOTE — Patient Instructions (Addendum)
Medication Instructions:   *If you need a refill on your cardiac medications before your next appointment, please call your pharmacy*  Lab Work:  If you have labs (blood work) drawn today and your tests are completely normal, you will receive your results only by: [name] Kitchen MyChart Message (if you have MyChart) OR . A paper copy in the mail If you have any lab test that is abnormal or we need to change your treatment, we will call you to review the results.  Testing/Procedures: Your physician has requested that you have an echocardiogram (already scheduled). Echocardiography is a painless test that uses sound waves to create images of your heart. It provides your doctor with information about the size and shape of your heart and how well your heart's chambers and valves are working. This procedure takes approximately one hour. There are no restrictions for this procedure.  Follow-Up: At [facility], you and your health needs are our priority.  As part of our continuing mission to provide you with exceptional heart care, we have created designated Provider Care Teams.  These Care Teams include your primary Cardiologist (physician) and Advanced Practice Providers (APPs -  Physician Assistants and Nurse Practitioners) who all work together to provide you with the care you need, when you need it.  Your next appointment:   1 year(s)  The format for your next appointment:   In Person  Provider:   You may see Dr. Johnsie Cancel or one of the following Advanced Practice Providers on your designated Care Team:    Truitt Merle, NP  Cecilie Kicks, NP  Kathyrn Drown, NP

## 2019-01-22 ENCOUNTER — Other Ambulatory Visit: Payer: Self-pay

## 2019-01-22 ENCOUNTER — Ambulatory Visit (HOSPITAL_COMMUNITY): Payer: Medicare Other | Attending: Cardiology

## 2019-01-22 DIAGNOSIS — I350 Nonrheumatic aortic (valve) stenosis: Secondary | ICD-10-CM | POA: Insufficient documentation

## 2019-01-23 ENCOUNTER — Telehealth: Payer: Self-pay

## 2019-01-23 DIAGNOSIS — I350 Nonrheumatic aortic (valve) stenosis: Secondary | ICD-10-CM

## 2019-01-23 NOTE — Telephone Encounter (Signed)
Patient aware of results. Per Dr. Johnsie Cancel, Mild to moderate AS f/u echo in a year. Patient verbalized understanding.

## 2019-01-23 NOTE — Telephone Encounter (Signed)
-----   Message from Josue Hector, MD sent at 01/22/2019  8:40 PM EST ----- Mild to moderate AS f/u echo in a year

## 2019-06-24 ENCOUNTER — Other Ambulatory Visit: Payer: Self-pay | Admitting: Urology

## 2019-06-24 LAB — PSA: PSA: 1.5 ng/mL (ref <=4.0)

## 2019-06-27 ENCOUNTER — Encounter: Payer: Self-pay | Admitting: Urology

## 2019-06-27 ENCOUNTER — Ambulatory Visit: Payer: Medicare Other | Admitting: Urology

## 2019-06-27 ENCOUNTER — Other Ambulatory Visit (HOSPITAL_COMMUNITY)
Admission: RE | Admit: 2019-06-27 | Discharge: 2019-06-27 | Disposition: A | Discharge status: Home / Self Care | Payer: Medicare Other | Source: Other Acute Inpatient Hospital | Attending: Urology | Admitting: Urology

## 2019-06-27 ENCOUNTER — Other Ambulatory Visit: Payer: Self-pay

## 2019-06-27 VITALS — BP 144/68 | HR 60 | Temp 97.5°F | Ht 68.0 in | Wt 178.0 lb

## 2019-06-27 DIAGNOSIS — R3915 Urgency of urination: Secondary | ICD-10-CM

## 2019-06-27 DIAGNOSIS — R9721 Rising PSA following treatment for malignant neoplasm of prostate: Secondary | ICD-10-CM

## 2019-06-27 DIAGNOSIS — R350 Frequency of micturition: Secondary | ICD-10-CM | POA: Diagnosis not present

## 2019-06-27 DIAGNOSIS — E291 Testicular hypofunction: Secondary | ICD-10-CM

## 2019-06-27 DIAGNOSIS — Z8546 Personal history of malignant neoplasm of prostate: Secondary | ICD-10-CM

## 2019-06-27 LAB — URINALYSIS, ROUTINE W REFLEX MICROSCOPIC
Bilirubin Urine: NEGATIVE
Glucose, UA: >=500 mg/dL — AB
Hgb urine dipstick: NEGATIVE
Ketones, ur: 5 mg/dL — AB
Leukocytes,Ua: NEGATIVE
Nitrite: NEGATIVE
Protein, ur: 30 mg/dL — AB
Specific Gravity, Urine: 1.021 (ref 1.005–1.030)
pH: 5 (ref 5.0–8.0)

## 2019-06-27 LAB — POCT URINALYSIS DIPSTICK
Bilirubin, UA: NEGATIVE
Blood, UA: NEGATIVE
Glucose, UA: POSITIVE — AB
Nitrite, UA: NEGATIVE
Protein, UA: POSITIVE — AB
Spec Grav, UA: 1.025 (ref 1.010–1.025)
Urobilinogen, UA: 0.2 E.U./dL
pH, UA: 6 (ref 5.0–8.0)

## 2019-06-27 NOTE — Progress Notes (Signed)
Urological Symptom Review  Patient is experiencing the following symptoms: Frequent urination Hard to postpone urination Get up at night to urinate Leakage of urine   Review of Systems  Gastrointestinal (upper)  : Negative for upper GI symptoms  Gastrointestinal (lower) : Negative for lower GI symptoms  Constitutional : Negative for symptoms  Skin: Negative for skin symptoms  Eyes: Negative for eye symptoms  Ear/Nose/Throat : Negative for Ear/Nose/Throat symptoms  Hematologic/Lymphatic: Negative for Hematologic/Lymphatic symptoms  Cardiovascular : Negative for cardiovascular symptoms  Respiratory : Negative for respiratory symptoms  Endocrine: Negative for endocrine symptoms  Musculoskeletal: Negative for musculoskeletal symptoms  Neurological: Negative for neurological symptoms  Psychologic: Negative for psychiatric symptoms 

## 2019-06-27 NOTE — Progress Notes (Signed)
Subjective:  1. Personal history of prostate cancer   2. Rising PSA following treatment for malignant neoplasm of prostate   3. Urinary frequency   4. Hypogonadism in male   5. Urgency of urination      Robert Mcknight has a history of a seed implant for T1c Gleason 6 low risk prostate cancer on 01/05/10. His PSA is back down to 1.5 from 1.7 in 9/20.  His nadir was 0.48 in 12/14. His last testosterone was 258 in 8/16. He has also had a prior TURP in 2009. He had hematuria in 4/13 and was found to have radiation changes in the prostate on cystoscopy.  He had further hematuria in 9/20 with repeat cystoscopy on 10/07/18 demonstrating radiation changes again. He has no nocturia x 1. He still has some urgency and UUI only if he delays voiding a long time. He has some intermittent frequency. He has some decrease in his stream with some intermittency but he feels like he is emptying. There are no changes in his voiding symptoms.  His IPSS is 13.  He has no associated signs or symptoms.   UA has 2+ LE but no hematuria.  He has some glucosuria.  His Glucose was 114 this morning.  IPSS    Row Name 06/27/19 1000         International Prostate Symptom Score   How often have you had the sensation of not emptying your bladder?  Less than half the time     How often have you had to urinate less than every two hours?  More than half the time     How often have you found you stopped and started again several times when you urinated?  Not at All     How often have you found it difficult to postpone urination?  Almost always     How often have you had a weak urinary stream?  Not at All     How often have you had to strain to start urination?  Not at All     How many times did you typically get up at night to urinate?  2 Times     Total IPSS Score  13       Quality of Life due to urinary symptoms   If you were to spend the rest of your life with your urinary condition just the way it is now how would you feel  about that?  Mixed         ROS:  ROS:  A complete review of systems was performed.  All systems are negative except for pertinent findings as noted.   Review of Systems  Musculoskeletal:       He has some back pain with sciatica into the right leg with some pain.     Allergies  Allergen Reactions  . Pollen Extract Other (See Comments)    Runny nose, congestion, cough    Outpatient Encounter Medications as of 06/27/2019  Medication Sig  . amLODipine (NORVASC) 10 MG tablet TAKE 1 TABLET EVERY DAY  . ascorbic acid (VITAMIN C) 500 MG tablet Take 500 mg by mouth daily.  [name] Kitchen aspirin EC 81 MG EC tablet Take 1 tablet (81 mg total) by mouth daily.  [name] Kitchen atorvastatin (LIPITOR) 40 MG tablet TAKE 1 TABLET BY MOUTH EVERY DAY AT 6PM  . Calcium Carb-Cholecalciferol (CALCIUM CARBONATE-VITAMIN D3 PO) Take 1 tablet by mouth 2 (two) times daily. Calcium 600 mg and Vitamin  D 800 units  .  carvedilol (COREG) 25 MG tablet Take 25 mg by mouth 2 (two) times daily.  . chlorhexidine (PERIDEX) 0.12 % solution RINSE WITH CAPFUL TWICE DAILY FOR 30 SECS AFTER MEALS  . hydrochlorothiazide (HYDRODIURIL) 25 MG tablet Take 1 tablet (25 mg total) by mouth daily. Please call [phone] to schedule for additional refill.  . loratadine-pseudoephedrine (CLARITIN-D 24-HOUR) 10-240 MG 24 hr tablet Take by mouth.  . metFORMIN (GLUCOPHAGE-XR) 500 MG 24 hr tablet Take 1,000 mg by mouth daily.  . metFORMIN (GLUMETZA) 500 MG (MOD) 24 hr tablet Take 500 mg by mouth 2 (two) times daily with a meal.  . Omega-3 Fatty Acids (FISH OIL) 1000 MG CPDR Take 1,200 mg by mouth 2 (two) times daily.   [name] Kitchen omeprazole (PRILOSEC) 20 MG capsule Take 20 mg by mouth 2 (two) times daily before a meal.   . vitamin E 1000 UNIT capsule Take by mouth.  . Bepotastine Besilate 1.5 % SOLN Place 1 drop into both eyes as needed.  [name] Kitchen CLINPRO 5000 1.1 % PSTE See admin instructions.  . cyanocobalamin 1000 MCG tablet Take 1,000 mcg by mouth daily.  .  cyclobenzaprine (FLEXERIL) 10 MG tablet Take 10 mg by mouth daily as needed for muscle spasms.  . ferrous sulfate 325 (65 FE) MG tablet Take 1 tablet (325 mg total) by mouth 2 (two) times daily with a meal. (Patient not taking: Reported on 06/27/2019)  . losartan (COZAAR) 100 MG tablet Take 1 tablet (100 mg total) by mouth daily. NEED OV. (Patient not taking: Reported on 06/27/2019)  . meloxicam (MOBIC) 15 MG tablet Take 15 mg by mouth daily. with food  . predniSONE (DELTASONE) 10 MG tablet Take 10 mg by mouth daily with breakfast.  . [DISCONTINUED] carvedilol (COREG) 12.5 MG tablet TAKE 1 TABLET TWICE A DAY   Facility-Administered Encounter Medications as of 06/27/2019  Medication  . 0.9 %  sodium chloride infusion    Past Medical History:  Diagnosis Date  . Arthritis   . Colon polyps   . Dental infection 10/2016  . Diabetes mellitus   . GERD (gastroesophageal reflux disease)   . Heart murmur   . Hyperlipemia   . Hypertension   . Prostate cancer ([place]) 2011  . Seasonal allergies   . Sleep apnea     Past Surgical History:  Procedure Laterality Date  . CARDIAC CATHETERIZATION N/A 02/26/2015   Procedure: Left Heart Cath and Coronary Angiography;  Surgeon: Belva Crome, MD;  Location: [facility];  Service: Cardiovascular;  Laterality: N/A;  . ELECTROPHYSIOLOGIC STUDY N/A 05/03/2015   Procedure: SVT Ablation;  Surgeon: Will Meredith Leeds, MD;  Location: [facility];  Service: Cardiovascular;  Laterality: N/A;  . FLEXIBLE SIGMOIDOSCOPY N/A 03/23/2013   Procedure: FLEXIBLE SIGMOIDOSCOPY;  Surgeon: Beryle Beams, MD;  Location: [facility];  Service: Endoscopy;  Laterality: N/A;  . INGUINAL HERNIA REPAIR  1992   left  . MOUTH SURGERY Left 12/22/2016   Top  . NASAL SINUS SURGERY  02/01/15  . TRANSURETHRAL RESECTION OF PROSTATE     seeds implanted    Social History   Socioeconomic History  . Marital status: Married    Spouse name: Not on file  . Number of children: Not on  file  . Years of education: Not on file  . Highest education level: Not on file  Occupational History  . Not on file  Tobacco Use  . Smoking status: Never Smoker  . Smokeless tobacco: Never Used  Substance  and Sexual Activity  . Alcohol use: No  . Drug use: No  . Sexual activity: Never  Other Topics Concern  . Not on file  Social History Narrative  . Not on file   Social Determinants of Health   Financial Resource Strain:   . Difficulty of Paying Living Expenses:   Food Insecurity:   . Worried About Charity fundraiser in the Last Year:   . Arboriculturist in the Last Year:   Transportation Needs:   . Film/video editor (Medical):   [name] Kitchen Lack of Transportation (Non-Medical):   Physical Activity:   . Days of Exercise per Week:   . Minutes of Exercise per Session:   Stress:   . Feeling of Stress :   Social Connections:   . Frequency of Communication with Friends and Family:   . Frequency of Social Gatherings with Friends and Family:   . Attends Religious Services:   . Active Member of Clubs or Organizations:   . Attends Archivist Meetings:   [name] Kitchen Marital Status:   Intimate Partner Violence:   . Fear of Current or Ex-Partner:   . Emotionally Abused:   [name] Kitchen Physically Abused:   . Sexually Abused:     Family History  Problem Relation Age of Onset  . Colon cancer Brother 43  . Colon cancer Brother   . Stomach cancer Neg Hx        Objective: Vitals:   06/27/19 1003  BP: (!) 144/68  Pulse: 60  Temp: (!) 97.5 F (36.4 C)     Physical Exam  Lab Results:  Results for orders placed or performed in visit on 06/27/19 (from the past 24 hour(s))  POCT urinalysis dipstick     Status: Abnormal   Collection Time: 06/27/19 10:17 AM  Result Value Ref Range   Color, UA yellow    Clarity, UA     Glucose, UA Positive (A) Negative   Bilirubin, UA neg    Ketones, UA moderate    Spec Grav, UA 1.025 1.010 - 1.025   Blood, UA neg    pH, UA 6.0 5.0 - 8.0    Protein, UA Positive (A) Negative   Urobilinogen, UA 0.2 0.2 or 1.0 E.U./dL   Nitrite, UA neg    Leukocytes, UA Moderate (2+) (A) Negative   Appearance     Odor      BMET No results for input(s): NA, K, CL, CO2, GLUCOSE, BUN, CREATININE, CALCIUM in the last 72 hours. PSA PSA  Date Value Ref Range Status  06/24/2019 1.5 < OR = 4.0 ng/mL Final    Comment:    The total PSA value from this assay system is  standardized against the WHO standard. The test  result will be approximately 20% lower when compared  to the equimolar-standardized total PSA (Beckman  Coulter). Comparison of serial PSA results should be  interpreted with this fact in mind. . This test was performed using the Siemens  chemiluminescent method. Values obtained from  different assay methods cannot be used interchangeably. PSA levels, regardless of value, should not be interpreted as absolute evidence of the presence or absence of disease.   03/01/2015 0.89 0.00 - 4.00 ng/mL Final    Comment:    (NOTE) While PSA levels of <=4.0 ng/ml are reported as reference range, some men with levels below 4.0 ng/ml can have prostate cancer and many men with PSA above 4.0 ng/ml do not have prostate cancer.  Other  tests such as free PSA, age specific reference ranges, PSA velocity and PSA doubling time may be helpful especially in men less than 3 years old.    No results found for: TESTOSTERONE    Studies/Results: No results found.    Assessment & Plan: History of prostate cancer with slowly rising PSA s/p seeds.  His PSA is actually down slightly prior to this visit.  I will have him return in 6 months with a PSA and testosterone.  Hypogonadism.  I don't have a recent testosterone and will check that at f/u.   Urgency.  This is stable on no treatment.   His UA is LE+ so I will send it for UA and culture.     No orders of the defined types were placed in this encounter.    Orders Placed This Encounter   Procedures  . Urine Culture    Standing Status:   Future    Standing Expiration Date:   08/27/2019  . Urinalysis, Routine w reflex microscopic    Standing Status:   Future    Standing Expiration Date:   08/27/2019  . PSA    Standing Status:   Future    Standing Expiration Date:   06/26/2020  . Testosterone    Standing Status:   Future    Standing Expiration Date:   06/26/2020  . POCT urinalysis dipstick      Return in about 6 months (around 12/27/2019) for with PSA and testosterone. .   CC: Leanna Battles, MD      Irine Seal 06/27/2019

## 2019-06-29 LAB — URINE CULTURE: Culture: NO GROWTH

## 2019-07-09 ENCOUNTER — Other Ambulatory Visit: Payer: Self-pay | Admitting: Internal Medicine

## 2019-07-09 DIAGNOSIS — G8929 Other chronic pain: Secondary | ICD-10-CM

## 2019-07-09 DIAGNOSIS — M545 Low back pain, unspecified: Secondary | ICD-10-CM

## 2019-07-14 ENCOUNTER — Other Ambulatory Visit: Payer: Self-pay | Admitting: Internal Medicine

## 2019-07-14 DIAGNOSIS — M5410 Radiculopathy, site unspecified: Secondary | ICD-10-CM

## 2019-08-05 ENCOUNTER — Ambulatory Visit
Admission: RE | Admit: 2019-08-05 | Discharge: 2019-08-05 | Disposition: A | Discharge status: Home / Self Care | Payer: Medicare Other | Source: Ambulatory Visit | Attending: Internal Medicine | Admitting: Internal Medicine

## 2019-08-05 ENCOUNTER — Other Ambulatory Visit: Payer: Self-pay

## 2019-08-05 DIAGNOSIS — M5410 Radiculopathy, site unspecified: Secondary | ICD-10-CM

## 2019-08-20 ENCOUNTER — Ambulatory Visit
Admission: RE | Admit: 2019-08-20 | Discharge: 2019-08-20 | Disposition: A | Discharge status: Home / Self Care | Payer: Medicare Other | Source: Ambulatory Visit | Attending: Internal Medicine | Admitting: Internal Medicine

## 2019-08-20 ENCOUNTER — Other Ambulatory Visit: Payer: Self-pay

## 2019-08-20 DIAGNOSIS — G8929 Other chronic pain: Secondary | ICD-10-CM

## 2019-08-20 DIAGNOSIS — M545 Low back pain, unspecified: Secondary | ICD-10-CM

## 2019-08-20 MED ORDER — METHYLPREDNISOLONE ACETATE 40 MG/ML INJ SUSP (RADIOLOG
120.0000 mg | Freq: Once | INTRAMUSCULAR | Status: AC
  Administered 2019-08-20: 120 mg via EPIDURAL

## 2019-08-20 MED ORDER — IOPAMIDOL (ISOVUE-M 200) INJECTION 41%
1.0000 mL | Freq: Once | INTRAMUSCULAR | Status: AC
  Administered 2019-08-20: 1 mL via EPIDURAL

## 2019-08-20 NOTE — Discharge Instructions (Signed)

## 2019-11-19 ENCOUNTER — Other Ambulatory Visit (HOSPITAL_COMMUNITY): Payer: Self-pay | Admitting: Internal Medicine

## 2019-11-19 ENCOUNTER — Ambulatory Visit (HOSPITAL_COMMUNITY)
Admission: RE | Admit: 2019-11-19 | Discharge: 2019-11-19 | Disposition: A | Discharge status: Home / Self Care | Payer: Medicare Other | Source: Ambulatory Visit | Attending: Internal Medicine | Admitting: Internal Medicine

## 2019-11-19 ENCOUNTER — Other Ambulatory Visit: Payer: Self-pay

## 2019-11-19 DIAGNOSIS — R0989 Other specified symptoms and signs involving the circulatory and respiratory systems: Secondary | ICD-10-CM

## 2019-12-12 ENCOUNTER — Other Ambulatory Visit: Payer: Self-pay

## 2019-12-12 DIAGNOSIS — Z8546 Personal history of malignant neoplasm of prostate: Secondary | ICD-10-CM

## 2019-12-12 DIAGNOSIS — E291 Testicular hypofunction: Secondary | ICD-10-CM

## 2019-12-25 ENCOUNTER — Other Ambulatory Visit: Payer: Self-pay

## 2019-12-25 ENCOUNTER — Other Ambulatory Visit: Payer: Medicare Other

## 2019-12-25 DIAGNOSIS — Z8546 Personal history of malignant neoplasm of prostate: Secondary | ICD-10-CM

## 2019-12-25 DIAGNOSIS — E291 Testicular hypofunction: Secondary | ICD-10-CM

## 2019-12-25 NOTE — Progress Notes (Signed)
CARDIOLOGY CONSULT NOTE       Patient ID: Robert Mcknight MRN: [id number] DOB/AGE: Sep 24, 1933 86 y.o.  Admit date: (Not on file) Referring Physician: Philip Aspen Primary Physician: Leanna Battles, MD Primary Cardiologist: Claiborne Billings Reason for Consultation: Aortic Stenosis  Active Problems:   * No active hospital problems. *   HPI: 86 y.o. seen by Dr Claiborne Billings and [facility] for SVT in 2017. Normal cath by Dr Tamala Julian in 2017. He has known mild aortic stenosis Echo 01/22/19 EF 55-60% mild MR mild AS with mean gradient 14 peak 20 mmHg and DVI 0.30 Murmur radiates to carotids with no significant stenosis there by duplex 11/19/19 He sees Dr Jeffie Pollock for f/u of his TURP and prostate cancer PSA 1.28 June 2019 and now 1.9   HR running low 50 at night Discussed cutting back coreg dose    ROS All other systems reviewed and negative except as noted above  Past Medical History:  Diagnosis Date  . Arthritis   . Colon polyps   . Dental infection 10/2016  . Diabetes mellitus   . GERD (gastroesophageal reflux disease)   . Heart murmur   . Hyperlipemia   . Hypertension   . Prostate cancer ([place]) 2011  . Seasonal allergies   . Sleep apnea     Family History  Problem Relation Age of Onset  . Colon cancer Brother 33  . Colon cancer Brother   . Stomach cancer Neg Hx     Social History   Socioeconomic History  . Marital status: Married    Spouse name: Not on file  . Number of children: Not on file  . Years of education: Not on file  . Highest education level: Not on file  Occupational History  . Not on file  Tobacco Use  . Smoking status: Never Smoker  . Smokeless tobacco: Never Used  Vaping Use  . Vaping Use: Never used  Substance and Sexual Activity  . Alcohol use: No  . Drug use: No  . Sexual activity: Never  Other Topics Concern  . Not on file  Social History Narrative  . Not on file   Social Determinants of Health   Financial Resource Strain:   . Difficulty of Paying Living  Expenses: Not on file  Food Insecurity:   . Worried About Charity fundraiser in the Last Year: Not on file  . Ran Out of Food in the Last Year: Not on file  Transportation Needs:   . Lack of Transportation (Medical): Not on file  . Lack of Transportation (Non-Medical): Not on file  Physical Activity:   . Days of Exercise per Week: Not on file  . Minutes of Exercise per Session: Not on file  Stress:   . Feeling of Stress : Not on file  Social Connections:   . Frequency of Communication with Friends and Family: Not on file  . Frequency of Social Gatherings with Friends and Family: Not on file  . Attends Religious Services: Not on file  . Active Member of Clubs or Organizations: Not on file  . Attends Archivist Meetings: Not on file  . Marital Status: Not on file  Intimate Partner Violence:   . Fear of Current or Ex-Partner: Not on file  . Emotionally Abused: Not on file  . Physically Abused: Not on file  . Sexually Abused: Not on file    Past Surgical History:  Procedure Laterality Date  . CARDIAC CATHETERIZATION N/A 02/26/2015   Procedure: Left Heart  Cath and Coronary Angiography;  Surgeon: Belva Crome, MD;  Location: [facility];  Service: Cardiovascular;  Laterality: N/A;  . ELECTROPHYSIOLOGIC STUDY N/A 05/03/2015   Procedure: SVT Ablation;  Surgeon: Will Meredith Leeds, MD;  Location: [facility];  Service: Cardiovascular;  Laterality: N/A;  . FLEXIBLE SIGMOIDOSCOPY N/A 03/23/2013   Procedure: FLEXIBLE SIGMOIDOSCOPY;  Surgeon: Beryle Beams, MD;  [facility];  Service: Endoscopy;  Laterality: N/A;  . INGUINAL HERNIA REPAIR  1992   left  . MOUTH SURGERY Left 12/22/2016   Top  . NASAL SINUS SURGERY  02/01/15  . TRANSURETHRAL RESECTION OF PROSTATE     seeds implanted      . sodium chloride      Physical Exam: Blood pressure 136/68, pulse 68, height 5\' 8"  (1.727 m), weight 78.9 kg, SpO2 99 %.   Affect appropriate Elderly black male   HEENT: normal Neck supple with no adenopathy JVP normal no bruits no thyromegaly Lungs clear with no wheezing and good diaphragmatic motion Heart:  S1/S2 AS  murmur, no rub, gallop or click PMI normal Abdomen: benighn, BS positve, no tenderness, no AAA no bruit.  No HSM or HJR Distal pulses intact with no bruits No edema Neuro non-focal Skin warm and dry No muscular weakness   Labs:   Lab Results  Component Value Date   WBC 6.7 01/07/2018   HGB 12.4 (L) 01/07/2018   HCT 38.9 (L) 01/07/2018   MCV 76.3 (L) 01/07/2018   PLT 191.0 01/07/2018   No results for input(s): NA, K, CL, CO2, BUN, CREATININE, CALCIUM, PROT, BILITOT, ALKPHOS, ALT, AST, GLUCOSE in the last 168 hours.  Invalid input(s): LABALBU Lab Results  Component Value Date   TROPONINI <0.03 02/26/2015    Lab Results  Component Value Date   CHOL 189 02/26/2015   Lab Results  Component Value Date   HDL 65 02/26/2015   Lab Results  Component Value Date   LDLCALC 112 (H) 02/26/2015   Lab Results  Component Value Date   TRIG 61 02/26/2015   Lab Results  Component Value Date   CHOLHDL 2.9 02/26/2015   No results found for: LDLDIRECT    Radiology: No results found.  EKG: 12/29/2019 SR rate 61 LVH no acute changes    ASSESSMENT AND PLAN:   1. Aortic Stenosis:  Mild on index TTE f/u in a  Year  S2 preserved on exam  2. SVT:  Post ablation stable no recurrence  3. SB: cut back coreg dose to 12.5 mg bid  4. HTN:  Well controlled.  Continue current medications and low sodium Dash type diet.   5. HLD: on statin labs with primary  6. DM:  On glumetza f/u labs with primary  7. Prostate:  History of cancer with seed implant f/u Dr Jeffie Pollock PSA 1.5->1.9 follow closeley   F/U in a year with echo for AS   Signed: Jenkins Rouge 12/29/2019, 2:52 PM

## 2019-12-26 ENCOUNTER — Ambulatory Visit: Payer: Medicare Other | Admitting: Urology

## 2019-12-26 LAB — TESTOSTERONE: Testosterone: 376 ng/dL (ref 264–916)

## 2019-12-26 LAB — PSA: Prostate Specific Ag, Serum: 2.1 ng/mL (ref 0.0–4.0)

## 2019-12-29 ENCOUNTER — Ambulatory Visit: Payer: Medicare Other | Admitting: Cardiovascular Disease

## 2019-12-29 ENCOUNTER — Other Ambulatory Visit: Payer: Self-pay

## 2019-12-29 ENCOUNTER — Encounter: Payer: Self-pay | Admitting: Cardiovascular Disease

## 2019-12-29 VITALS — BP 136/68 | HR 68 | Ht 68.0 in | Wt 174.0 lb

## 2019-12-29 DIAGNOSIS — I350 Nonrheumatic aortic (valve) stenosis: Secondary | ICD-10-CM | POA: Diagnosis not present

## 2019-12-29 NOTE — Patient Instructions (Signed)
Medication Instructions:  *If you need a refill on your cardiac medications before your next appointment, please call your pharmacy*  Lab Work: If you have labs (blood work) drawn today and your tests are completely normal, you will receive your results only by: [name] Kitchen MyChart Message (if you have MyChart) OR . A paper copy in the mail If you have any lab test that is abnormal or we need to change your treatment, we will call you to review the results.   Testing/Procedures: Your physician has requested that you have an echocardiogram in one year. Echocardiography is a painless test that uses sound waves to create images of your heart. It provides your doctor with information about the size and shape of your heart and how well your heart's chambers and valves are working. This procedure takes approximately one hour. There are no restrictions for this procedure.  Follow-Up: At [facility], you and your health needs are our priority.  As part of our continuing mission to provide you with exceptional heart care, we have created designated Provider Care Teams.  These Care Teams include your primary Cardiologist (physician) and Advanced Practice Providers (APPs -  Physician Assistants and Nurse Practitioners) who all work together to provide you with the care you need, when you need it.  We recommend signing up for the patient portal called "MyChart".  Sign up information is provided on this After Visit Summary.  MyChart is used to connect with patients for Virtual Visits (Telemedicine).  Patients are able to view lab/test results, encounter notes, upcoming appointments, etc.  Non-urgent messages can be sent to your provider as well.   To learn more about what you can do with MyChart, go to [email or web address].    Your next appointment:   1 year(s)  The format for your next appointment:   In Person  Provider:   You may see Dr. Johnsie Cancel or one of the following Advanced Practice Providers on your  designated Care Team:    Truitt Merle, NP  Cecilie Kicks, NP  Kathyrn Drown, NP

## 2020-01-01 NOTE — Progress Notes (Signed)
Subjective:  1. Personal history of prostate cancer   2. Rising PSA following treatment for malignant neoplasm of prostate   3. Urgency of urination   4. Radiation cystitis      [name] has a history of a seed implant for T1c Gleason 6 low risk prostate cancer on 01/05/10. His PSA is up to 2.1 from 1.5 at his last visit and 1.7 in 9/20.  His nadir was 0.48 in 12/14. His testosterone is normal at 376.  He has also had a prior TURP in 2009. He had hematuria in 4/13 and was found to have radiation changes in the prostate on cystoscopy.  He had further hematuria in 9/20 with repeat cystoscopy on 10/07/18 demonstrating radiation changes again. He has had no further hematuria.  He has no nocturia x 1. He still has some urgency and UUI only if he delays voiding a long time.  He tried oxybutynin for a month but it didn't help.   He has some intermittent frequency. He has some decrease in his stream with some intermittency but he feels like he is emptying. There are no changes in his voiding symptoms.   He has no associated signs or symptoms.      ROS:  ROS:  A complete review of systems was performed.  All systems are negative except for pertinent findings as noted.   Review of Systems  Constitutional: Negative.   Gastrointestinal: Negative.   Musculoskeletal:       He had some back pain with sciatica into the right leg with some pain but he gets some relief with capsacian cream.     Allergies  Allergen Reactions  . Pollen Extract Other (See Comments)    Runny nose, congestion, cough    Outpatient Encounter Medications as of 01/02/2020  Medication Sig  . amLODipine (NORVASC) 10 MG tablet TAKE 1 TABLET EVERY DAY  . ascorbic acid (VITAMIN C) 500 MG tablet Take 500 mg by mouth daily.  [name] Kitchen aspirin EC 81 MG EC tablet Take 1 tablet (81 mg total) by mouth daily.  [name] Kitchen atorvastatin (LIPITOR) 40 MG tablet TAKE 1 TABLET BY MOUTH EVERY DAY AT 6PM  . Bepotastine Besilate 1.5 % SOLN Place 1 drop into  both eyes as needed.  . Calcium Carb-Cholecalciferol (CALCIUM CARBONATE-VITAMIN D3 PO) Take 1 tablet by mouth 2 (two) times daily. Calcium 600 mg and Vitamin  D 800 units  . carvedilol (COREG) 25 MG tablet Take 25 mg by mouth 2 (two) times daily.  [name] Kitchen CLINPRO 5000 1.1 % PSTE See admin instructions.  . ferrous sulfate 325 (65 FE) MG tablet Take 1 tablet (325 mg total) by mouth 2 (two) times daily with a meal.  . hydrochlorothiazide (HYDRODIURIL) 25 MG tablet Take 1 tablet (25 mg total) by mouth daily. Please call [phone] to schedule for additional refill.  . losartan (COZAAR) 100 MG tablet Take 1 tablet (100 mg total) by mouth daily. NEED OV.  . metFORMIN (GLUMETZA) 500 MG (MOD) 24 hr tablet Take 500 mg by mouth 2 (two) times daily with a meal.  . Omega-3 Fatty Acids (FISH OIL) 1000 MG CPDR Take 1,200 mg by mouth 2 (two) times daily.  [name] Kitchen omeprazole (PRILOSEC) 20 MG capsule Take 20 mg by mouth 2 (two) times daily before a meal.  . vitamin E 1000 UNIT capsule Take by mouth.  . [DISCONTINUED] chlorhexidine (PERIDEX) 0.12 % solution RINSE WITH CAPFUL TWICE DAILY FOR 30 SECS AFTER MEALS (Patient not taking: Reported on 01/02/2020)  . [  DISCONTINUED] cyanocobalamin 1000 MCG tablet Take 1,000 mcg by mouth daily. (Patient not taking: Reported on 01/02/2020)   Facility-Administered Encounter Medications as of 01/02/2020  Medication  . 0.9 %  sodium chloride infusion    Past Medical History:  Diagnosis Date  . Arthritis   . Colon polyps   . Dental infection 10/2016  . Diabetes mellitus   . GERD (gastroesophageal reflux disease)   . Heart murmur   . Hyperlipemia   . Hypertension   . Prostate cancer ([place]) 2011  . Seasonal allergies   . Sleep apnea     Past Surgical History:  Procedure Laterality Date  . CARDIAC CATHETERIZATION N/A 02/26/2015   Procedure: Left Heart Cath and Coronary Angiography;  Surgeon: Belva Crome, MD;  Location: [facility];  Service: Cardiovascular;  Laterality:  N/A;  . ELECTROPHYSIOLOGIC STUDY N/A 05/03/2015   Procedure: SVT Ablation;  Surgeon: Will Meredith Leeds, MD;  Location: [facility];  Service: Cardiovascular;  Laterality: N/A;  . FLEXIBLE SIGMOIDOSCOPY N/A 03/23/2013   Procedure: FLEXIBLE SIGMOIDOSCOPY;  Surgeon: Beryle Beams, MD;  Location: [facility];  Service: Endoscopy;  Laterality: N/A;  . INGUINAL HERNIA REPAIR  1992   left  . MOUTH SURGERY Left 12/22/2016   Top  . NASAL SINUS SURGERY  02/01/15  . TRANSURETHRAL RESECTION OF PROSTATE     seeds implanted    Social History   Socioeconomic History  . Marital status: Married    Spouse name: Not on file  . Number of children: Not on file  . Years of education: Not on file  . Highest education level: Not on file  Occupational History  . Not on file  Tobacco Use  . Smoking status: Never Smoker  . Smokeless tobacco: Never Used  Vaping Use  . Vaping Use: Never used  Substance and Sexual Activity  . Alcohol use: No  . Drug use: No  . Sexual activity: Never  Other Topics Concern  . Not on file  Social History Narrative  . Not on file   Social Determinants of Health   Financial Resource Strain: Not on file  Food Insecurity: Not on file  Transportation Needs: Not on file  Physical Activity: Not on file  Stress: Not on file  Social Connections: Not on file  Intimate Partner Violence: Not on file    Family History  Problem Relation Age of Onset  . Colon cancer Brother 23  . Colon cancer Brother   . Stomach cancer Neg Hx        Objective: Vitals:   01/02/20 0954  BP: (!) 171/84  Pulse: 71  Temp: 98.8 F (37.1 C)     Physical Exam Vitals reviewed.  Constitutional:      Appearance: Normal appearance.  Chest:  Breasts:     Right: No supraclavicular adenopathy.     Left: No supraclavicular adenopathy.    Genitourinary:    Comments: AP without lesion. NST without mass. Prostate smooth and flat.  SV non-palpable.  Lymphadenopathy:      Cervical:     Right cervical: No superficial cervical adenopathy.    Left cervical: No superficial cervical adenopathy.     Upper Body:     Right upper body: No supraclavicular adenopathy.     Left upper body: No supraclavicular adenopathy.  Neurological:     Mental Status: He is alert.     Lab Results:  No results found for this or any previous visit (from the past  24 hour(s)).  BMET No results for input(s): NA, K, CL, CO2, GLUCOSE, BUN, CREATININE, CALCIUM in the last 72 hours. PSA PSA  Date Value Ref Range Status  06/24/2019 1.5 < OR = 4.0 ng/mL Final    Comment:    The total PSA value from this assay system is  standardized against the WHO standard. The test  result will be approximately 20% lower when compared  to the equimolar-standardized total PSA (Beckman  Coulter). Comparison of serial PSA results should be  interpreted with this fact in mind. . This test was performed using the Siemens  chemiluminescent method. Values obtained from  different assay methods cannot be used interchangeably. PSA levels, regardless of value, should not be interpreted as absolute evidence of the presence or absence of disease.   03/01/2015 0.89 0.00 - 4.00 ng/mL Final    Comment:    (NOTE) While PSA levels of <=4.0 ng/ml are reported as reference range, some men with levels below 4.0 ng/ml can have prostate cancer and many men with PSA above 4.0 ng/ml do not have prostate cancer.  Other tests such as free PSA, age specific reference ranges, PSA velocity and PSA doubling time may be helpful especially in men less than 74 years old.    Testosterone  Date Value Ref Range Status  12/25/2019 376 264 - 916 ng/dL Final    Comment:    Adult male reference interval is based on a population of healthy nonobese males (BMI <30) between 23 and 21 years old. [place], [place] [phone]. PMID: [id number].    PSA 0.89 2/17 PSA 1.5  6/21 PSA 2.1  12/21   Studies/Results: No  results found.    Assessment & Plan: History of prostate cancer with slowly rising PSA s/p seeds.  His PSA is up further to 2.1. I discussed options for evaluation and will try to get him set up for a PSMA PET.  Hypogonadism.  His testosterone is normal prior to this visit.   Urgency.  This is stable on no treatment.       No orders of the defined types were placed in this encounter.    Orders Placed This Encounter  Procedures  . NM PET ([id number]) SKULL TO MID THIGH    Standing Status:   Future    Standing Expiration Date:   02/02/2020    Order Specific Question:   If indicated for the ordered procedure, I authorize the administration of a radiopharmaceutical per Radiology protocol    Answer:   Yes    Order Specific Question:   Preferred imaging location?    Answer:   [facility]  . Urinalysis, Routine w reflex microscopic  . PSA    Standing Status:   Future    Standing Expiration Date:   05/02/2020      Return in about 3 months (around 04/01/2020) for With PSA.   CC: Leanna Battles, MD      Irine Seal 01/02/2020

## 2020-01-02 ENCOUNTER — Encounter: Payer: Self-pay | Admitting: Urology

## 2020-01-02 ENCOUNTER — Other Ambulatory Visit: Payer: Self-pay

## 2020-01-02 ENCOUNTER — Ambulatory Visit (INDEPENDENT_AMBULATORY_CARE_PROVIDER_SITE_OTHER): Payer: Medicare Other | Admitting: Urology

## 2020-01-02 ENCOUNTER — Telehealth: Payer: Self-pay | Admitting: Urology

## 2020-01-02 VITALS — BP 171/84 | HR 71 | Temp 98.8°F | Ht 68.0 in | Wt 174.0 lb

## 2020-01-02 DIAGNOSIS — Z8546 Personal history of malignant neoplasm of prostate: Secondary | ICD-10-CM

## 2020-01-02 DIAGNOSIS — R9721 Rising PSA following treatment for malignant neoplasm of prostate: Secondary | ICD-10-CM

## 2020-01-02 DIAGNOSIS — R3915 Urgency of urination: Secondary | ICD-10-CM

## 2020-01-02 DIAGNOSIS — N3040 Irradiation cystitis without hematuria: Secondary | ICD-10-CM

## 2020-01-02 LAB — URINALYSIS, ROUTINE W REFLEX MICROSCOPIC
Bilirubin, UA: NEGATIVE
Glucose, UA: NEGATIVE
Ketones, UA: NEGATIVE
Leukocytes,UA: NEGATIVE
Nitrite, UA: NEGATIVE
RBC, UA: NEGATIVE
Specific Gravity, UA: 1.02 (ref 1.005–1.030)
Urobilinogen, Ur: 0.2 mg/dL (ref 0.2–1.0)
pH, UA: 7 (ref 5.0–7.5)

## 2020-01-02 NOTE — Progress Notes (Signed)
Urological Symptom Review  Patient is experiencing the following symptoms: Frequent urination Getting up at night to urinate Leakage of urine Stream starts and stops Review of Systems  Gastrointestinal (upper)  : Negative for upper GI symptoms  Gastrointestinal (lower) : Negative for lower GI symptoms  Constitutional : Negative for symptoms  Skin: Negative for skin symptoms  Eyes: Negative for eye symptoms  Ear/Nose/Throat : Negative for Ear/Nose/Throat symptoms  Hematologic/Lymphatic: Negative for Hematologic/Lymphatic symptoms  Cardiovascular : Negative for cardiovascular symptoms  Respiratory : Negative for respiratory symptoms  Endocrine: Negative for endocrine symptoms  Musculoskeletal: Negative for musculoskeletal symptoms  Neurological: Negative for neurological symptoms  Psychologic: Negative for psychiatric symptoms

## 2020-01-02 NOTE — Telephone Encounter (Signed)
Pt's wife called wanting to know his PSA level right now. Pls call when you get a chance.

## 2020-01-02 NOTE — Telephone Encounter (Signed)
Returned wifes call. No answer. Left message.

## 2020-01-02 NOTE — Telephone Encounter (Signed)
Pt seen today by Dr. Alyson Ingles.

## 2020-01-07 NOTE — Progress Notes (Addendum)
[facility] Note  01/09/2020     CHIEF COMPLAINT Patient presents for Retina Evaluation   HISTORY OF PRESENT ILLNESS: Robert Mcknight is a 86 y.o. male who presents to the clinic today for:   HPI    Retina Evaluation    In both eyes.  This started weeks ago.  Duration of weeks.  Context:  distance vision and near vision.  I, the attending physician,  performed the HPI with the patient and updated documentation appropriately.          Comments    BS: 141 this AM Pt states his vision is worse OS.  Patient denies any issues with vision OD.  Patient denies eye pain or discomfort.  Patient denies any new or worsening floaters or fol OU.       Last edited by Bernarda Caffey, MD on 01/09/2020 12:44 PM. (History)      Referring physician: Marylynn Pearson, MD [address],  [place] [address]  HISTORICAL INFORMATION:   Selected notes from the MEDICAL RECORD NUMBER Referred by Dr. Marylynn Pearson for concern of retinal holes   CURRENT MEDICATIONS: Current Outpatient Medications (Ophthalmic Drugs)  Medication Sig  . Bepotastine Besilate 1.5 % SOLN Place 1 drop into both eyes as needed.  . prednisoLONE acetate (PRED FORTE) 1 % ophthalmic suspension Place 1 drop into the right eye 4 (four) times daily for 7 days.   No current facility-administered medications for this visit. (Ophthalmic Drugs)   Current Outpatient Medications (Other)  Medication Sig  . amLODipine (NORVASC) 10 MG tablet TAKE 1 TABLET EVERY DAY  . ascorbic acid (VITAMIN C) 500 MG tablet Take 500 mg by mouth daily.  [name] Kitchen aspirin EC 81 MG EC tablet Take 1 tablet (81 mg total) by mouth daily.  [name] Kitchen atorvastatin (LIPITOR) 40 MG tablet TAKE 1 TABLET BY MOUTH EVERY DAY AT 6PM  . Calcium Carb-Cholecalciferol (CALCIUM CARBONATE-VITAMIN D3 PO) Take 1 tablet by mouth 2 (two) times daily. Calcium 600 mg and Vitamin  D 800 units  . carvedilol (COREG) 25 MG tablet Take 25 mg by mouth 2 (two)  times daily.  [name] Kitchen CLINPRO 5000 1.1 % PSTE See admin instructions.  . ferrous sulfate 325 (65 FE) MG tablet Take 1 tablet (325 mg total) by mouth 2 (two) times daily with a meal.  . hydrochlorothiazide (HYDRODIURIL) 25 MG tablet Take 1 tablet (25 mg total) by mouth daily. Please call [phone] to schedule for additional refill.  . losartan (COZAAR) 100 MG tablet Take 1 tablet (100 mg total) by mouth daily. NEED OV.  . metFORMIN (GLUMETZA) 500 MG (MOD) 24 hr tablet Take 500 mg by mouth 2 (two) times daily with a meal.  . Omega-3 Fatty Acids (FISH OIL) 1000 MG CPDR Take 1,200 mg by mouth 2 (two) times daily.  [name] Kitchen omeprazole (PRILOSEC) 20 MG capsule Take 20 mg by mouth 2 (two) times daily before a meal.  . vitamin E 1000 UNIT capsule Take by mouth.   Current Facility-Administered Medications (Other)  Medication Route  . 0.9 %  sodium chloride infusion Intravenous      REVIEW OF SYSTEMS: ROS    Positive for: Musculoskeletal, Endocrine, Eyes   Negative for: Constitutional, Gastrointestinal, Neurological, Skin, Genitourinary, HENT, Cardiovascular, Respiratory, Psychiatric, Allergic/Imm, Heme/Lymph   Last edited by Doneen Poisson on 01/09/2020  9:33 AM. (History)       ALLERGIES Allergies  Allergen Reactions  . Pollen Extract Other (See  Comments)    Runny nose, congestion, cough    PAST MEDICAL HISTORY Past Medical History:  Diagnosis Date  . Arthritis   . Colon polyps   . Dental infection 10/2016  . Diabetes mellitus   . GERD (gastroesophageal reflux disease)   . Heart murmur   . Hyperlipemia   . Hypertension   . Prostate cancer ([place]) 2011  . Seasonal allergies   . Sleep apnea    Past Surgical History:  Procedure Laterality Date  . CARDIAC CATHETERIZATION N/A 02/26/2015   Procedure: Left Heart Cath and Coronary Angiography;  Surgeon: Belva Crome, MD;  Location: [facility];  Service: Cardiovascular;  Laterality: N/A;  . ELECTROPHYSIOLOGIC STUDY N/A 05/03/2015    Procedure: SVT Ablation;  Surgeon: Will Meredith Leeds, MD;  Location: [facility];  Service: Cardiovascular;  Laterality: N/A;  . FLEXIBLE SIGMOIDOSCOPY N/A 03/23/2013   Procedure: FLEXIBLE SIGMOIDOSCOPY;  Surgeon: Beryle Beams, MD;  Location: [facility];  Service: Endoscopy;  Laterality: N/A;  . INGUINAL HERNIA REPAIR  1992   left  . MOUTH SURGERY Left 12/22/2016   Top  . NASAL SINUS SURGERY  02/01/15  . TRANSURETHRAL RESECTION OF PROSTATE     seeds implanted    FAMILY HISTORY Family History  Problem Relation Age of Onset  . Colon cancer Brother 68  . Colon cancer Brother   . Stomach cancer Neg Hx     SOCIAL HISTORY Social History   Tobacco Use  . Smoking status: Never Smoker  . Smokeless tobacco: Never Used  Vaping Use  . Vaping Use: Never used  Substance Use Topics  . Alcohol use: No  . Drug use: No         OPHTHALMIC EXAM:  Base Eye Exam    Visual Acuity (Snellen - Linear)      Right Left   Dist [place] 20/40 -2 20/50 -2   Dist ph [place] 20/25 +1 20/25 -2   Correction: Glasses       Tonometry (Tonopen, 9:42 AM)      Right Left   Pressure 17 14       Pupils      Dark Light Shape React APD   Right 3 2 Round Minimal 0   Left 3 2 Round Minimal 0       Visual Fields      Left Right    Full Full       Extraocular Movement      Right Left    Full Full       Neuro/Psych    Oriented x3: Yes   Mood/Affect: Normal       Dilation    Both eyes: 1.0% Mydriacyl, 2.5% Phenylephrine @ 9:42 AM        Slit Lamp and Fundus Exam    Slit Lamp Exam      Right Left   Lids/Lashes Dermato Dermato   Conjunctiva/Sclera Nasal and temporal pinguecula, mild melanosis Nasal and temporal pinguecula, mild melanosis   Cornea Arcus, trace PEE and fine endopigment Arcus, trace PEE and fine endopigment   Anterior Chamber Deep and quiet Deep and quiet   Iris Round and dilated Round and dilated   Lens 2+ NS, 2+ cortical 2+ NS, 2+ cortical   Vitreous Synerisis, PVD,  condensations Synerisis, PVD, condensations       Fundus Exam      Right Left   Disc Pink, sharp, compact Pink, sharp, compact   C/D Ratio 0.2  0.2   Macula Flat, blunted foveal reflex drusen, RPE mottling and clumping Flat, blunted foveal reflex, +drusen, RPE mottling and clumping   Vessels Attenuated, tortuous, mild copper wiring, av crossing changes Attenuated, tortuous, mild copper wiring, av crossing changes   Periphery Round hole w/SRF and surounding pigment at 0700--incomplete pigment surrounding, focal patch of lattice at 0600 Operculated hole at 0430 w/ +cuff of SRF and incomplete pigment surrounding, lattice w/ atrophic holes from 0500-0600        Refraction    Manifest Refraction      Sphere Cylinder Axis Dist VA   Right +0.50 +0.50 180 20/25+1   Left -0.75 +1.00 010 20/25-2          IMAGING AND PROCEDURES  Imaging and Procedures for 01/09/2020  OCT, Retina - OU - Both Eyes       Right Eye Quality was good. Central Foveal Thickness: 247. Progression has no prior data. Findings include normal foveal contour, no IRF, no SRF, retinal drusen  (Rare focal drusen nasal mac, trace ERM).   Left Eye Quality was good. Central Foveal Thickness: 244. Progression has no prior data. Findings include normal foveal contour, no IRF, no SRF, retinal drusen  (Mild scattered drusen, trace ERM).   Notes *Images captured and stored on drive  Diagnosis / Impression:  OD: Rare focal drusen nasal mac, trace ERM OS: Mild scattered drusen, trace ERM  Clinical management:  See below  Abbreviations: NFP - Normal foveal profile. CME - cystoid macular edema. PED - pigment epithelial detachment. IRF - intraretinal fluid. SRF - subretinal fluid. EZ - ellipsoid zone. ERM - epiretinal membrane. ORA - outer retinal atrophy. ORT - outer retinal tubulation. SRHM - subretinal hyper-reflective material. IRHM - intraretinal hyper-reflective material       Repair Retinal Detach, Photocoag - OD -  Right Eye       LASER PROCEDURE NOTE  Procedure:  Barrier laser retinopexy using slit lamp laser, RIGHT eye   Diagnosis:   Lattice degeneration w/ atrophic holes and focal retinal detachment, RIGHT eye                     Patches of lattice: 1200 superiorly; 0600-0700 inferiorly                         Atrophic hole with cuff of SRF / focal RD at 0700  Surgeon: Bernarda Caffey, MD, PhD  Anesthesia: Topical  Informed consent obtained, operative eye marked, and time out performed prior to initiation of laser.   Laser settings:  Lumenis Smart532 laser, slit lamp Lens: Mainster PRP 165 Power: 260 mW Spot size: 200 microns Duration: 30 msec  # spots: 479  Placement of laser: Using a Mainster PRP 165 contact lens at the slit lamp, laser was placed in three confluent rows around patches of lattice w/ atrophic holes at 1200 and fro 0600-0700 oclock anterior to equator with additional rows anteriorly.  Complications: None.  Patient tolerated the procedure well and received written and verbal post-procedure care information/education.                 ASSESSMENT/PLAN:    ICD-10-CM   1. Lattice degeneration of both retinas  H35.413 Repair Retinal Detach, Photocoag - OD - Right Eye  2. Retinal hole of both eyes  H33.323 Repair Retinal Detach, Photocoag - OD - Right Eye  3. Bilateral retinal detachment  H33.23 Repair Retinal Detach, Photocoag - OD - Right  Eye  4. Retinal edema  H35.81 OCT, Retina - OU - Both Eyes  5. Epiretinal membrane (ERM) of both eyes  H35.373   6. Intermediate stage nonexudative age-related macular degeneration of both eyes  H35.3132   7. Essential hypertension  I10   8. Hypertensive retinopathy of both eyes  H35.033   9. Combined forms of age-related cataract of both eyes  H25.813    1-4. Lattice degeneration w/ atrophic holes and focal RD OU - OD: lattice from 6-7 with round hole w/ +SRF/focal RD and incomplete pigmentation at 7; mild lattice at 1200 -  OS: focal lattice w/ operculated hole and +SRF/focal RD at 430; lattice w/ atrophic holes 0500-0600 - discussed findings, prognosis, and treatment options including observation - recommend laser retinopexy OU, OD first today, 12.17.21. - pt wishes to proceed with laser - RBA of procedure discussed, questions answered - informed consent obtained and signed - see procedure note - start PF QID OD x7 days - f/u Tuesday, 10 am for POV OD and laser retinopexy OS      5. Age related macular degeneration, non-exudative, OU  - intermediate stage with mild drusen OU  - The incidence, anatomy, and pathology of dry AMD, risk of progression, and the AREDS and AREDS 2 studies including smoking risks discussed with patient.  - Recommend amsler grid monitoring  6. Mild epiretinal membrane OU - The natural history, anatomy, potential for loss of vision, and treatment options including vitrectomy techniques and the complications of endophthalmitis, retinal detachment, vitreous hemorrhage, cataract progression and permanent vision loss discussed with the patient. - mild ERM OU - BCVA 20/25 OU - asymptomatic, no metamorphopsia - no indication for surgery at this time - monitor for now  7,8. Hypertensive retinopathy OU - discussed importance of tight BP control - monitor  9. Mixed Cataract OU - The symptoms of cataract, surgical options, and treatments and risks were discussed with patient - discussed diagnosis and progression - not yet visually significant - monitor for now  Ophthalmic Meds Ordered this visit:  Meds ordered this encounter  Medications  . prednisoLONE acetate (PRED FORTE) 1 % ophthalmic suspension    Sig: Place 1 drop into the right eye 4 (four) times daily for 7 days.    Dispense:  10 mL    Refill:  0      Return in about 4 days (around 01/13/2020) for Tuesday, 10 am - POV OD, Laser retinopexy OS.  There are no Patient Instructions on file for this visit.  Explained the  diagnoses, plan, and follow up with the patient and they expressed understanding.  Patient expressed understanding of the importance of proper follow up care.  This document serves as a record of services personally performed by Gardiner Sleeper, MD, PhD. It was created on their behalf by San Jetty. Owens Shark, OA an ophthalmic technician. The creation of this record is the provider's dictation and/or activities during the visit.    Electronically signed by: San Jetty. Owens Shark, [place] 12.15.2021 12:45 PM   This document serves as a record of services personally performed by Gardiner Sleeper, MD, PhD. It was created on their behalf by Estill Bakes, COT an ophthalmic technician. The creation of this record is the provider's dictation and/or activities during the visit.    Electronically signed by: Estill Bakes, COT 01/09/20 @ 12:45 PM  Gardiner Sleeper, M.D., Ph.D. Diseases & Surgery of the Retina and [place] 12.17.21  I have  reviewed the above documentation for accuracy and completeness, and I agree with the above. Gardiner Sleeper, M.D., Ph.D. 01/09/20 12:45 PM   Abbreviations: M myopia (nearsighted); A astigmatism; H hyperopia (farsighted); P presbyopia; Mrx spectacle prescription;  CTL contact lenses; OD right eye; OS left eye; OU both eyes  XT exotropia; ET esotropia; PEK punctate epithelial keratitis; PEE punctate epithelial erosions; DES dry eye syndrome; MGD meibomian gland dysfunction; ATs artificial tears; PFAT's preservative free artificial tears; Keller nuclear sclerotic cataract; PSC posterior subcapsular cataract; ERM epi-retinal membrane; PVD posterior vitreous detachment; RD retinal detachment; DM diabetes mellitus; DR diabetic retinopathy; NPDR non-proliferative diabetic retinopathy; PDR proliferative diabetic retinopathy; CSME clinically significant macular edema; DME diabetic macular edema; dbh dot blot hemorrhages; CWS cotton wool spot; POAG primary open angle  glaucoma; C/D cup-to-disc ratio; HVF humphrey visual field; GVF goldmann visual field; OCT optical coherence tomography; IOP intraocular pressure; BRVO Branch retinal vein occlusion; CRVO central retinal vein occlusion; CRAO central retinal artery occlusion; BRAO branch retinal artery occlusion; RT retinal tear; SB scleral buckle; PPV pars plana vitrectomy; VH Vitreous hemorrhage; PRP panretinal laser photocoagulation; IVK intravitreal kenalog; VMT vitreomacular traction; MH Macular hole;  NVD neovascularization of the disc; NVE neovascularization elsewhere; AREDS age related eye disease study; ARMD age related macular degeneration; POAG primary open angle glaucoma; EBMD epithelial/anterior basement membrane dystrophy; ACIOL anterior chamber intraocular lens; IOL intraocular lens; PCIOL posterior chamber intraocular lens; Phaco/IOL phacoemulsification with intraocular lens placement; Meraux photorefractive keratectomy; LASIK laser assisted in situ keratomileusis; HTN hypertension; DM diabetes mellitus; COPD chronic obstructive pulmonary disease

## 2020-01-09 ENCOUNTER — Ambulatory Visit (INDEPENDENT_AMBULATORY_CARE_PROVIDER_SITE_OTHER): Payer: Medicare Other | Admitting: Ophthalmology

## 2020-01-09 ENCOUNTER — Other Ambulatory Visit: Payer: Self-pay

## 2020-01-09 ENCOUNTER — Encounter (INDEPENDENT_AMBULATORY_CARE_PROVIDER_SITE_OTHER): Payer: Self-pay | Admitting: Ophthalmology

## 2020-01-09 DIAGNOSIS — H353132 Nonexudative age-related macular degeneration, bilateral, intermediate dry stage: Secondary | ICD-10-CM

## 2020-01-09 DIAGNOSIS — H3581 Retinal edema: Secondary | ICD-10-CM

## 2020-01-09 DIAGNOSIS — H3323 Serous retinal detachment, bilateral: Secondary | ICD-10-CM | POA: Diagnosis not present

## 2020-01-09 DIAGNOSIS — H35413 Lattice degeneration of retina, bilateral: Secondary | ICD-10-CM | POA: Diagnosis not present

## 2020-01-09 DIAGNOSIS — H35373 Puckering of macula, bilateral: Secondary | ICD-10-CM

## 2020-01-09 DIAGNOSIS — H33323 Round hole, bilateral: Secondary | ICD-10-CM | POA: Diagnosis not present

## 2020-01-09 DIAGNOSIS — H25813 Combined forms of age-related cataract, bilateral: Secondary | ICD-10-CM

## 2020-01-09 DIAGNOSIS — H35033 Hypertensive retinopathy, bilateral: Secondary | ICD-10-CM

## 2020-01-09 DIAGNOSIS — I10 Essential (primary) hypertension: Secondary | ICD-10-CM

## 2020-01-09 MED ORDER — PREDNISOLONE ACETATE 1 % OP SUSP: 1.0000 [drp] | Freq: Four times a day (QID) | OPHTHALMIC | 0 refills | Status: AC

## 2020-01-09 NOTE — Progress Notes (Signed)
[facility] Note  01/13/2020     CHIEF COMPLAINT Patient presents for Retina Follow Up   HISTORY OF PRESENT ILLNESS: Robert Mcknight is a 86 y.o. male who presents to the clinic today for:   HPI    Retina Follow Up    Patient presents with  Other.  In both eyes.  This started days ago.  Severity is moderate.  Duration of 4 days.  Since onset it is stable.  I, the attending physician,  performed the HPI with the patient and updated documentation appropriately.          Comments    86 y/o male pt here for 4 day f/u s/p laser retinopexy for lattice OD, and here for laser retinopexy for lattice OS today.  No change in [place] OU.  Tolerated laser well.  Denies pain, FOL, floaters.  Pred QID OD.       Last edited by Bernarda Caffey, MD on 01/13/2020 12:29 PM. (History)     Patient states no problems since laser OD  Referring physician: Leanna Battles, MD [place],  [place] [address]  HISTORICAL INFORMATION:   Selected notes from the MEDICAL RECORD NUMBER Referred by Dr. Marylynn Pearson for concern of retinal holes   CURRENT MEDICATIONS: Current Outpatient Medications (Ophthalmic Drugs)  Medication Sig  . Bepotastine Besilate 1.5 % SOLN Place 1 drop into both eyes as needed.  . prednisoLONE acetate (PRED FORTE) 1 % ophthalmic suspension Place 1 drop into the right eye 4 (four) times daily for 7 days.   No current facility-administered medications for this visit. (Ophthalmic Drugs)   Current Outpatient Medications (Other)  Medication Sig  . amLODipine (NORVASC) 10 MG tablet TAKE 1 TABLET EVERY DAY  . ascorbic acid (VITAMIN C) 500 MG tablet Take 500 mg by mouth daily.  [name] Kitchen aspirin EC 81 MG EC tablet Take 1 tablet (81 mg total) by mouth daily.  [name] Kitchen atorvastatin (LIPITOR) 40 MG tablet TAKE 1 TABLET BY MOUTH EVERY DAY AT 6PM  . Calcium Carb-Cholecalciferol (CALCIUM CARBONATE-VITAMIN D3 PO) Take 1 tablet by mouth 2 (two) times daily. Calcium 600 mg  and Vitamin  D 800 units  . carvedilol (COREG) 25 MG tablet Take 25 mg by mouth 2 (two) times daily.  [name] Kitchen CLINPRO 5000 1.1 % PSTE See admin instructions.  . ferrous sulfate 325 (65 FE) MG tablet Take 1 tablet (325 mg total) by mouth 2 (two) times daily with a meal.  . hydrochlorothiazide (HYDRODIURIL) 25 MG tablet Take 1 tablet (25 mg total) by mouth daily. Please call [phone] to schedule for additional refill.  . losartan (COZAAR) 100 MG tablet Take 1 tablet (100 mg total) by mouth daily. NEED OV.  . metFORMIN (GLUMETZA) 500 MG (MOD) 24 hr tablet Take 500 mg by mouth 2 (two) times daily with a meal.  . Omega-3 Fatty Acids (FISH OIL) 1000 MG CPDR Take 1,200 mg by mouth 2 (two) times daily.  [name] Kitchen omeprazole (PRILOSEC) 20 MG capsule Take 20 mg by mouth 2 (two) times daily before a meal.  . vitamin E 1000 UNIT capsule Take by mouth.   Current Facility-Administered Medications (Other)  Medication Route  . 0.9 %  sodium chloride infusion Intravenous      REVIEW OF SYSTEMS: ROS    Positive for: Musculoskeletal, Cardiovascular, Eyes, Respiratory   Negative for: Constitutional, Gastrointestinal, Neurological, Skin, Genitourinary, HENT, Endocrine, Psychiatric, Allergic/Imm, Heme/Lymph   Last edited by Matthew Folks, COA on  01/13/2020  9:55 AM. (History)       ALLERGIES Allergies  Allergen Reactions  . Pollen Extract Other (See Comments)    Runny nose, congestion, cough    PAST MEDICAL HISTORY Past Medical History:  Diagnosis Date  . Arthritis   . Cataract    Mixed form OU  . Colon polyps   . Dental infection 10/2016  . Diabetes mellitus   . GERD (gastroesophageal reflux disease)   . Heart murmur   . Hyperlipemia   . Hypertension   . Hypertensive retinopathy    OU  . Macular degeneration    Dry OU  . Prostate cancer ([place]) 2011  . Seasonal allergies   . Sleep apnea    Past Surgical History:  Procedure Laterality Date  . CARDIAC CATHETERIZATION N/A 02/26/2015    Procedure: Left Heart Cath and Coronary Angiography;  Surgeon: Belva Crome, MD;  Location: [facility];  Service: Cardiovascular;  Laterality: N/A;  . ELECTROPHYSIOLOGIC STUDY N/A 05/03/2015   Procedure: SVT Ablation;  Surgeon: Will Meredith Leeds, MD;  Location: [facility];  Service: Cardiovascular;  Laterality: N/A;  . FLEXIBLE SIGMOIDOSCOPY N/A 03/23/2013   Procedure: FLEXIBLE SIGMOIDOSCOPY;  Surgeon: Beryle Beams, MD;  Location: [facility];  Service: Endoscopy;  Laterality: N/A;  . INGUINAL HERNIA REPAIR  1992   left  . MOUTH SURGERY Left 12/22/2016   Top  . NASAL SINUS SURGERY  02/01/15  . TRANSURETHRAL RESECTION OF PROSTATE     seeds implanted    FAMILY HISTORY Family History  Problem Relation Age of Onset  . Colon cancer Brother 50  . Colon cancer Brother   . Stomach cancer Neg Hx     SOCIAL HISTORY Social History   Tobacco Use  . Smoking status: Never Smoker  . Smokeless tobacco: Never Used  Vaping Use  . Vaping Use: Never used  Substance Use Topics  . Alcohol use: No  . Drug use: No         OPHTHALMIC EXAM:  Base Eye Exam    Visual Acuity (Snellen - Linear)      Right Left   Dist [place] 20/30 -2 20/30 -2   Dist ph [place] 20/25 20/25 -2       Tonometry (Tonopen, 9:58 AM)      Right Left   Pressure 18 18       Pupils      Dark Light Shape React APD   Right 3 2 Round Minimal None   Left 3 2 Round Minimal None       Visual Fields (Counting fingers)      Left Right    Full Full       Extraocular Movement      Right Left    Full, Ortho Full, Ortho       Neuro/Psych    Oriented x3: Yes   Mood/Affect: Normal       Dilation    Both eyes: 1.0% Mydriacyl, 2.5% Phenylephrine @ 9:58 AM        Slit Lamp and Fundus Exam    Slit Lamp Exam      Right Left   Lids/Lashes Dermato Dermato   Conjunctiva/Sclera Nasal and temporal pinguecula, mild melanosis Nasal and temporal pinguecula, mild melanosis   Cornea Arcus, trace PEE and fine  endopigment Arcus, trace PEE and fine endopigment   Anterior Chamber Deep and quiet Deep and quiet   Iris Round and dilated Round and dilated  Lens 2+ NS, 2+ cortical 2+ NS, 2+ cortical   Vitreous Synerisis, PVD, condensations Synerisis, PVD, condensations       Fundus Exam      Right Left   Disc Pink, sharp, compact Pink, sharp, compact   C/D Ratio 0.2 0.2   Macula Flat, blunted foveal reflex drusen, RPE mottling and clumping Flat, blunted foveal reflex, +drusen, RPE mottling and clumping   Vessels Attenuated, tortuous, mild copper wiring, av crossing changes Attenuated, tortuous, mild copper wiring, av crossing changes   Periphery Round hole w/SRF and surounding pigment at 0700--incomplete pigment surrounding, focal patch of lattice at 0600, focal lattice at 1100--good early laser changes surrounding all lesions Operculated hole at 0430 w/ +cuff of SRF and incomplete pigment surrounding, lattice w/ atrophic holes from 0500-0600          IMAGING AND PROCEDURES  Imaging and Procedures for 01/13/2020  OCT, Retina - OU - Both Eyes       Right Eye Quality was good. Central Foveal Thickness: 242. Progression has been stable. Findings include normal foveal contour, no IRF, no SRF, retinal drusen  (Rare focal drusen nasal mac, trace ERM).   Left Eye Quality was good. Central Foveal Thickness: 245. Progression has been stable. Findings include normal foveal contour, no IRF, no SRF, retinal drusen  (Mild scattered drusen, trace ERM).   Notes *Images captured and stored on drive  Diagnosis / Impression:  OD: Rare focal drusen nasal mac, trace ERM OS: Mild scattered drusen, trace ERM  Clinical management:  See below  Abbreviations: NFP - Normal foveal profile. CME - cystoid macular edema. PED - pigment epithelial detachment. IRF - intraretinal fluid. SRF - subretinal fluid. EZ - ellipsoid zone. ERM - epiretinal membrane. ORA - outer retinal atrophy. ORT - outer retinal tubulation.  SRHM - subretinal hyper-reflective material. IRHM - intraretinal hyper-reflective material       Repair Retinal Detach, Photocoag - OS - Left Eye       LASER PROCEDURE NOTE  Procedure:  [facility] laser retinopexy using slit lamp laser, LEFT eye   Diagnosis:   Lattice degeneration w/ atrophic holes, LEFT eye                     0430 - operculated hole with +cuff SRF / focal RD                         Patches of lattice: 0500-0600  Surgeon: Bernarda Caffey, MD, PhD  Anesthesia: Topical  Informed consent obtained, operative eye marked, and time out performed prior to initiation of laser.   Laser settings:  Lumenis Smart532 laser, slit lamp Lens: [place] PRP 165 Power: 250 mW Spot size: 200 microns Duration: 30 msec  # spots: 415  Placement of laser: Using a [place] PRP 165 contact lens at the slit lamp, laser was placed in three confluent rows around operculated hole w/ SRF at 0430 and lattice degen from 5-6 oclock anterior to equator with additional rows anteriorly.  Complications: None.  Patient tolerated the procedure well and received written and verbal post-procedure care information/education.                 ASSESSMENT/PLAN:    ICD-10-CM   1. 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 Retinal hole of both eyes  H33.323 Repair Retinal Detach, Photocoag - OS - Left Eye  3. Bilateral retinal detachment  H33.23 Repair Retinal Detach, Photocoag - OS - Left Eye  4. Retinal edema  H35.81 OCT, Retina - OU - Both Eyes  5. Intermediate stage nonexudative age-related macular degeneration of both eyes  H35.3132   6. Epiretinal membrane (ERM) of both eyes  H35.373   7. Essential hypertension  I10   8. Hypertensive retinopathy of both eyes  H35.033   9. Combined forms of age-related cataract of both eyes  H25.813    1-4. Lattice degeneration w/ atrophic holes and focal RD OU  - OD: lattice from 6-7 with round hole  w/ +SRF/focal RD and incomplete pigmentation at 7; mild lattice at 1100 with good laser surrounding all lesions - OS: focal lattice w/ operculated hole and +SRF/focal RD at 430; lattice w/ atrophic holes 0500-0600  - s/p laser retinopexy OD 12.17.21 -- good early laser changes all lesions - recommend laser retinopexy OS today, 12.21.21 - pt wishes to proceed with laser - RBA of procedure discussed, questions answered - informed consent obtained and signed - see procedure note - start PF QID OD x7 days - f/u 3-4 wks for POV OU      5. Age related macular degeneration, non-exudative, OU  - intermediate stage with mild drusen OU  - The incidence, anatomy, and pathology of dry AMD, risk of progression, and the AREDS and AREDS 2 studies including smoking risks discussed with patient.  - Recommend amsler grid monitoring  6. Mild epiretinal membrane OU - The natural history, anatomy, potential for loss of vision, and treatment options including vitrectomy techniques and the complications of endophthalmitis, retinal detachment, vitreous hemorrhage, cataract progression and permanent vision loss discussed with the patient. - mild ERM OU - BCVA 20/25 OU - asymptomatic, no metamorphopsia - no indication for surgery at this time - monitor for now  7,8. Hypertensive retinopathy OU - discussed importance of tight BP control - monitor  9. Mixed Cataract OU - The symptoms of cataract, surgical options, and treatments and risks were discussed with patient - discussed diagnosis and progression - not yet visually significant - monitor for now  Ophthalmic Meds Ordered this visit:  No orders of the defined types were placed in this encounter.     Return for 3-4 wks, POV laser OU.  There are no Patient Instructions on file for this visit.  Explained the diagnoses, plan, and follow up with the patient and they expressed understanding.  Patient expressed understanding of the importance of proper  follow up care.  This document serves as a record of services personally performed by Gardiner Sleeper, MD, PhD. It was created on their behalf by Roselee Nova, COMT. The creation of this record is the provider's dictation and/or activities during the visit.  Electronically signed by: Roselee Nova, COMT 01/13/20 12:39 PM  Gardiner Sleeper, M.D., Ph.D. Diseases & Surgery of the Retina and [place] 01/13/2020   I have reviewed the above documentation for accuracy and completeness, and I agree with the above. Gardiner Sleeper, M.D., Ph.D. 01/13/20 12:39 PM   Abbreviations: M myopia (nearsighted); A astigmatism; H hyperopia (farsighted); P presbyopia; Mrx spectacle prescription;  CTL contact lenses; OD right eye; OS left eye; OU both eyes  XT exotropia; ET esotropia; PEK punctate epithelial keratitis; PEE punctate epithelial erosions; DES dry eye syndrome; MGD meibomian gland dysfunction; ATs artificial tears; PFAT's preservative free artificial tears; [place] nuclear sclerotic cataract; PSC posterior subcapsular cataract; ERM epi-retinal membrane; PVD posterior vitreous detachment; RD retinal  detachment; DM diabetes mellitus; DR diabetic retinopathy; NPDR non-proliferative diabetic retinopathy; PDR proliferative diabetic retinopathy; CSME clinically significant macular edema; DME diabetic macular edema; dbh dot blot hemorrhages; CWS cotton wool spot; POAG primary open angle glaucoma; C/D cup-to-disc ratio; HVF humphrey visual field; GVF goldmann visual field; OCT optical coherence tomography; IOP intraocular pressure; BRVO Branch retinal vein occlusion; CRVO central retinal vein occlusion; CRAO central retinal artery occlusion; BRAO branch retinal artery occlusion; RT retinal tear; SB scleral buckle; PPV pars plana vitrectomy; VH Vitreous hemorrhage; PRP panretinal laser photocoagulation; IVK intravitreal kenalog; VMT vitreomacular traction; MH Macular hole;  NVD  neovascularization of the disc; NVE neovascularization elsewhere; AREDS age related eye disease study; ARMD age related macular degeneration; POAG primary open angle glaucoma; EBMD epithelial/anterior basement membrane dystrophy; ACIOL anterior chamber intraocular lens; IOL intraocular lens; PCIOL posterior chamber intraocular lens; Phaco/IOL phacoemulsification with intraocular lens placement; [place] photorefractive keratectomy; LASIK laser assisted in situ keratomileusis; HTN hypertension; DM diabetes mellitus; COPD chronic obstructive pulmonary disease

## 2020-01-13 ENCOUNTER — Other Ambulatory Visit: Payer: Self-pay

## 2020-01-13 ENCOUNTER — Encounter (INDEPENDENT_AMBULATORY_CARE_PROVIDER_SITE_OTHER): Payer: Self-pay | Admitting: Ophthalmology

## 2020-01-13 ENCOUNTER — Ambulatory Visit (INDEPENDENT_AMBULATORY_CARE_PROVIDER_SITE_OTHER): Payer: Medicare Other | Admitting: Ophthalmology

## 2020-01-13 DIAGNOSIS — H33323 Round hole, bilateral: Secondary | ICD-10-CM | POA: Diagnosis not present

## 2020-01-13 DIAGNOSIS — H353132 Nonexudative age-related macular degeneration, bilateral, intermediate dry stage: Secondary | ICD-10-CM

## 2020-01-13 DIAGNOSIS — H35033 Hypertensive retinopathy, bilateral: Secondary | ICD-10-CM

## 2020-01-13 DIAGNOSIS — H3581 Retinal edema: Secondary | ICD-10-CM | POA: Diagnosis not present

## 2020-01-13 DIAGNOSIS — H35413 Lattice degeneration of retina, bilateral: Secondary | ICD-10-CM | POA: Diagnosis not present

## 2020-01-13 DIAGNOSIS — I10 Essential (primary) hypertension: Secondary | ICD-10-CM

## 2020-01-13 DIAGNOSIS — H3323 Serous retinal detachment, bilateral: Secondary | ICD-10-CM | POA: Diagnosis not present

## 2020-01-13 DIAGNOSIS — H35373 Puckering of macula, bilateral: Secondary | ICD-10-CM

## 2020-01-13 DIAGNOSIS — H25813 Combined forms of age-related cataract, bilateral: Secondary | ICD-10-CM

## 2020-01-14 ENCOUNTER — Ambulatory Visit (HOSPITAL_COMMUNITY)
Admission: RE | Admit: 2020-01-14 | Discharge: 2020-01-14 | Disposition: A | Discharge status: Home / Self Care | Payer: Medicare Other | Source: Ambulatory Visit | Attending: Urology | Admitting: Urology

## 2020-01-14 DIAGNOSIS — R9721 Rising PSA following treatment for malignant neoplasm of prostate: Secondary | ICD-10-CM | POA: Diagnosis present

## 2020-01-14 DIAGNOSIS — Z8546 Personal history of malignant neoplasm of prostate: Secondary | ICD-10-CM

## 2020-01-14 MED ORDER — PIFLIFOLASTAT F 18 (PYLARIFY) INJECTION
9.0000 | Freq: Once | INTRAVENOUS | Status: AC
  Administered 2020-01-14: 8.89 via INTRAVENOUS

## 2020-01-15 ENCOUNTER — Telehealth: Payer: Self-pay

## 2020-01-15 NOTE — Telephone Encounter (Signed)
Pt notified per Dr. Wrenn's note. 

## 2020-01-15 NOTE — Telephone Encounter (Signed)
-----   Message from Irine Seal, MD sent at 01/14/2020  4:44 PM EST ----- No prostate cancer spread seen on the PET.  F/u as planned.

## 2020-01-20 ENCOUNTER — Other Ambulatory Visit (HOSPITAL_COMMUNITY): Payer: Medicare Other

## 2020-02-09 ENCOUNTER — Encounter (INDEPENDENT_AMBULATORY_CARE_PROVIDER_SITE_OTHER): Payer: Medicare Other | Admitting: Ophthalmology

## 2020-02-10 NOTE — Progress Notes (Signed)
[facility] Note  02/12/2020     CHIEF COMPLAINT Patient presents for Post-op Follow-up   HISTORY OF PRESENT ILLNESS: Robert Mcknight is a 87 y.o. male who presents to the clinic today for:   HPI    Post-op Follow-up    In both eyes.  Vision is stable.  I, the attending physician,  performed the HPI with the patient and updated documentation appropriately.          Comments    4 week follow up lattice degeneration OU (Retinopexy OD 01/09/2020, OS 01/13/20)-  No new problems and vision is stable OU.       Last edited by Bernarda Caffey, MD on 02/12/2020 10:17 PM. (History)    Patient states no problems after laser in either eye, he used PF for 7 days as directed  Referring physician: Marylynn Pearson, MD [facility],  [place] [address]  HISTORICAL INFORMATION:   Selected notes from the MEDICAL RECORD NUMBER Referred by Dr. Marylynn Pearson for concern of retinal holes   CURRENT MEDICATIONS: Current Outpatient Medications (Ophthalmic Drugs)  Medication Sig  . Bepotastine Besilate 1.5 % SOLN Place 1 drop into both eyes as needed.   No current facility-administered medications for this visit. (Ophthalmic Drugs)   Current Outpatient Medications (Other)  Medication Sig  . amLODipine (NORVASC) 10 MG tablet TAKE 1 TABLET EVERY DAY  . ascorbic acid (VITAMIN C) 500 MG tablet Take 500 mg by mouth daily.  [name] Kitchen aspirin EC 81 MG EC tablet Take 1 tablet (81 mg total) by mouth daily.  [name] Kitchen atorvastatin (LIPITOR) 40 MG tablet TAKE 1 TABLET BY MOUTH EVERY DAY AT 6PM  . Calcium Carb-Cholecalciferol (CALCIUM CARBONATE-VITAMIN D3 PO) Take 1 tablet by mouth 2 (two) times daily. Calcium 600 mg and Vitamin  D 800 units  . carvedilol (COREG) 25 MG tablet Take 25 mg by mouth 2 (two) times daily.  [name] Kitchen CLINPRO 5000 1.1 % PSTE See admin instructions.  . ferrous sulfate 325 (65 FE) MG tablet Take 1 tablet (325 mg total) by mouth 2 (two) times daily with a meal.  .  hydrochlorothiazide (HYDRODIURIL) 25 MG tablet Take 1 tablet (25 mg total) by mouth daily. Please call [phone] to schedule for additional refill.  . losartan (COZAAR) 100 MG tablet Take 1 tablet (100 mg total) by mouth daily. NEED OV.  . metFORMIN (GLUMETZA) 500 MG (MOD) 24 hr tablet Take 500 mg by mouth 2 (two) times daily with a meal.  . Omega-3 Fatty Acids (FISH OIL) 1000 MG CPDR Take 1,200 mg by mouth 2 (two) times daily.  [name] Kitchen omeprazole (PRILOSEC) 20 MG capsule Take 20 mg by mouth 2 (two) times daily before a meal.  . vitamin E 1000 UNIT capsule Take by mouth.   Current Facility-Administered Medications (Other)  Medication Route  . 0.9 %  sodium chloride infusion Intravenous      REVIEW OF SYSTEMS: ROS    Positive for: Musculoskeletal, Cardiovascular, Eyes, Respiratory   Negative for: Constitutional, Gastrointestinal, Neurological, Skin, Genitourinary, HENT, Endocrine, Psychiatric, Allergic/Imm, Heme/Lymph   Last edited by Leonie Douglas, COA on 02/12/2020  2:00 PM. (History)       ALLERGIES Allergies  Allergen Reactions  . Pollen Extract Other (See Comments)    Runny nose, congestion, cough    PAST MEDICAL HISTORY Past Medical History:  Diagnosis Date  . Arthritis   . Cataract    Mixed form OU  . Colon polyps   .  Dental infection 10/2016  . Diabetes mellitus   . GERD (gastroesophageal reflux disease)   . Heart murmur   . Hyperlipemia   . Hypertension   . Hypertensive retinopathy    OU  . Macular degeneration    Dry OU  . Prostate cancer ([place]) 2011  . Seasonal allergies   . Sleep apnea    Past Surgical History:  Procedure Laterality Date  . CARDIAC CATHETERIZATION N/A 02/26/2015   Procedure: Left Heart Cath and Coronary Angiography;  Surgeon: Belva Crome, MD;  Location: [facility];  Service: Cardiovascular;  Laterality: N/A;  . ELECTROPHYSIOLOGIC STUDY N/A 05/03/2015   Procedure: SVT Ablation;  Surgeon: Will Meredith Leeds, MD;  Location: [facility];  Service: Cardiovascular;  Laterality: N/A;  . FLEXIBLE SIGMOIDOSCOPY N/A 03/23/2013   Procedure: FLEXIBLE SIGMOIDOSCOPY;  Surgeon: Beryle Beams, MD;  Location: [facility];  Service: Endoscopy;  Laterality: N/A;  . INGUINAL HERNIA REPAIR  1992   left  . MOUTH SURGERY Left 12/22/2016   Top  . NASAL SINUS SURGERY  02/01/15  . TRANSURETHRAL RESECTION OF PROSTATE     seeds implanted    FAMILY HISTORY Family History  Problem Relation Age of Onset  . Colon cancer Brother 53  . Colon cancer Brother   . Stomach cancer Neg Hx     SOCIAL HISTORY Social History   Tobacco Use  . Smoking status: Never Smoker  . Smokeless tobacco: Never Used  Vaping Use  . Vaping Use: Never used  Substance Use Topics  . Alcohol use: No  . Drug use: No         OPHTHALMIC EXAM:  Base Eye Exam    Visual Acuity (Snellen - Linear)      Right Left   Dist [place] 20/30 -2 20/30 -2   Dist ph [place] 20/30 +1 NI       Tonometry (Tonopen, 2:08 PM)      Right Left   Pressure 17 15       Pupils      Dark Light Shape React APD   Right 3 2 Round Minimal None   Left 3 2 Round Minimal None       Visual Fields (Counting fingers)      Left Right    Full Full       Extraocular Movement      Right Left    Full Full       Neuro/Psych    Oriented x3: Yes   Mood/Affect: Normal       Dilation    Both eyes: 1.0% Mydriacyl, 2.5% Phenylephrine @ 2:08 PM        Slit Lamp and Fundus Exam    Slit Lamp Exam      Right Left   Lids/Lashes Dermato Dermato   Conjunctiva/Sclera Nasal and temporal pinguecula, mild melanosis Nasal and temporal pinguecula, mild melanosis   Cornea Arcus, trace PEE and fine endopigment Arcus, trace PEE and fine endopigment   Anterior Chamber Deep and quiet Deep and quiet   Iris Round and dilated Round and dilated   Lens 2+ NS, 2+ cortical 2+ NS, 2+ cortical   Vitreous Synerisis, PVD, condensations Synerisis, PVD, condensations       Fundus Exam      Right  Left   Disc Pink, sharp, compact Pink, sharp, compact   C/D Ratio 0.2 0.2   Macula Flat, blunted foveal reflex, drusen, RPE mottling and clumping Flat, blunted foveal reflex, +  drusen, RPE mottling and clumping   Vessels Attenuated, tortuous, mild copper wiring, av crossing changes Attenuated, tortuous, mild copper wiring, av crossing changes   Periphery Attached, Round hole w/SRF and surrounding pigment at 0700--incomplete pigment surrounding, focal patch of lattice at 0600, focal lattice at 1100--good laser changes surrounding all lesions, scattered peripheral drusen, no new RT/RD Attached, Operculated hole at 0430 w/ +cuff of SRF and incomplete pigment surrounding, lattice w/ atrophic holes from 0500-0600 -- good laser surrounding all lesions, scattered peripheral drusen, No new RT/RD          IMAGING AND PROCEDURES  Imaging and Procedures for 02/12/2020  OCT, Retina - OU - Both Eyes       Right Eye Quality was good. Central Foveal Thickness: 248. Progression has been stable. Findings include normal foveal contour, no IRF, no SRF, retinal drusen , pigment epithelial detachment (Rare focal drusen nasal mac, trace ERM).   Left Eye Quality was good. Central Foveal Thickness: 251. Progression has been stable. Findings include normal foveal contour, no IRF, no SRF, retinal drusen  (Mild scattered drusen, trace ERM).   Notes *Images captured and stored on drive  Diagnosis / Impression:  NFP; no IRF/SRF OU OD: Rare focal drusen nasal mac, trace ERM OS: Mild scattered drusen, trace ERM  Clinical management:  See below  Abbreviations: NFP - Normal foveal profile. CME - cystoid macular edema. PED - pigment epithelial detachment. IRF - intraretinal fluid. SRF - subretinal fluid. EZ - ellipsoid zone. ERM - epiretinal membrane. ORA - outer retinal atrophy. ORT - outer retinal tubulation. SRHM - subretinal hyper-reflective material. 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 Retinal hole of both eyes  H33.323   3. Bilateral retinal detachment  H33.23   4. Retinal edema  H35.81 OCT, Retina - OU - Both Eyes  5. Intermediate stage nonexudative age-related macular degeneration of both eyes  H35.3132   6. Epiretinal membrane (ERM) of both eyes  H35.373   7. Essential hypertension  I10   8. Hypertensive retinopathy of both eyes  H35.033   9. Combined forms of age-related cataract of both eyes  H25.813    1-4. Lattice degeneration w/ atrophic holes and focal RD OU  - OD: lattice from 6-7 with round hole w/ +SRF/focal RD and incomplete pigmentation at 7; mild lattice at 1100 with good laser surrounding all lesions - OS: focal lattice w/ operculated hole and +SRF/focal RD at 430; lattice w/ atrophic holes 0500-0600  - s/p laser retinopexy OD 12.17.21 -- good laser changes all lesions  - s/p laser retinopexy OS 12.21.21 -- good laser changes all lesions - completed PF QID OD x7 days - f/u 3-4 months      5. Age related macular degeneration, non-exudative, OU  - intermediate stage with mild drusen OU  - The incidence, anatomy, and pathology of dry AMD, risk of progression, and the AREDS and AREDS 2 studies including smoking risks discussed with patient.  - Recommend amsler grid monitoring   - f/u 3-4 mos  6. Mild epiretinal membrane OU - mild ERM OU - BCVA 20/30 OU - asymptomatic, no metamorphopsia - no indication for surgery at this time - monitor for now  7,8. Hypertensive retinopathy OU - discussed importance of tight BP control - monitor  9. Mixed Cataract OU - The  symptoms of cataract, surgical options, and treatments and risks were discussed with patient - discussed diagnosis and progression - not yet visually significant - monitor for now  Ophthalmic Meds Ordered this visit:  No orders of the defined types were placed in this encounter.     Return  for f/u 3-4 months, lattice degeneration OU, DFE, OCT.  There are no Patient Instructions on file for this visit.  Explained the diagnoses, plan, and follow up with the patient and they expressed understanding.  Patient expressed understanding of the importance of proper follow up care.  This document serves as a record of services personally performed by Gardiner Sleeper, MD, PhD. It was created on their behalf by Leonie Douglas, an ophthalmic technician. The creation of this record is the provider's dictation and/or activities during the visit.    Electronically signed by: Leonie Douglas COA, 02/12/20  10:21 PM   This document serves as a record of services personally performed by Gardiner Sleeper, MD, PhD. It was created on their behalf by San Jetty. Owens Shark, OA an ophthalmic technician. The creation of this record is the provider's dictation and/or activities during the visit.    Electronically signed by: San Jetty. Owens Shark, [place] 01.20.2022 10:21 PM   Gardiner Sleeper, M.D., Ph.D. Diseases & Surgery of the Retina and Vitreous [facility]  I have reviewed the above documentation for accuracy and completeness, and I agree with the above. Gardiner Sleeper, M.D., Ph.D. 02/12/20 10:21 PM   Abbreviations: M myopia (nearsighted); A astigmatism; H hyperopia (farsighted); P presbyopia; Mrx spectacle prescription;  CTL contact lenses; OD right eye; OS left eye; OU both eyes  XT exotropia; ET esotropia; PEK punctate epithelial keratitis; PEE punctate epithelial erosions; DES dry eye syndrome; MGD meibomian gland dysfunction; ATs artificial tears; PFAT's preservative free artificial tears; [place] nuclear sclerotic cataract; PSC posterior subcapsular cataract; ERM epi-retinal membrane; PVD posterior vitreous detachment; RD retinal detachment; DM diabetes mellitus; DR diabetic retinopathy; NPDR non-proliferative diabetic retinopathy; PDR proliferative diabetic retinopathy; CSME clinically  significant macular edema; DME diabetic macular edema; dbh dot blot hemorrhages; CWS cotton wool spot; POAG primary open angle glaucoma; C/D cup-to-disc ratio; HVF humphrey visual field; GVF goldmann visual field; OCT optical coherence tomography; IOP intraocular pressure; BRVO Branch retinal vein occlusion; CRVO central retinal vein occlusion; CRAO central retinal artery occlusion; BRAO branch retinal artery occlusion; RT retinal tear; SB scleral buckle; PPV pars plana vitrectomy; VH Vitreous hemorrhage; PRP panretinal laser photocoagulation; IVK intravitreal kenalog; VMT vitreomacular traction; MH Macular hole;  NVD neovascularization of the disc; NVE neovascularization elsewhere; AREDS age related eye disease study; ARMD age related macular degeneration; POAG primary open angle glaucoma; EBMD epithelial/anterior basement membrane dystrophy; ACIOL anterior chamber intraocular lens; IOL intraocular lens; PCIOL posterior chamber intraocular lens; Phaco/IOL phacoemulsification with intraocular lens placement; [place] photorefractive keratectomy; LASIK laser assisted in situ keratomileusis; HTN hypertension; DM diabetes mellitus; COPD chronic obstructive pulmonary disease

## 2020-02-12 ENCOUNTER — Other Ambulatory Visit: Payer: Self-pay

## 2020-02-12 ENCOUNTER — Ambulatory Visit (INDEPENDENT_AMBULATORY_CARE_PROVIDER_SITE_OTHER): Payer: Medicare Other | Admitting: Ophthalmology

## 2020-02-12 ENCOUNTER — Encounter (INDEPENDENT_AMBULATORY_CARE_PROVIDER_SITE_OTHER): Payer: Self-pay | Admitting: Ophthalmology

## 2020-02-12 DIAGNOSIS — H35033 Hypertensive retinopathy, bilateral: Secondary | ICD-10-CM

## 2020-02-12 DIAGNOSIS — H35373 Puckering of macula, bilateral: Secondary | ICD-10-CM

## 2020-02-12 DIAGNOSIS — H35413 Lattice degeneration of retina, bilateral: Secondary | ICD-10-CM

## 2020-02-12 DIAGNOSIS — H33323 Round hole, bilateral: Secondary | ICD-10-CM | POA: Diagnosis not present

## 2020-02-12 DIAGNOSIS — I10 Essential (primary) hypertension: Secondary | ICD-10-CM

## 2020-02-12 DIAGNOSIS — H3581 Retinal edema: Secondary | ICD-10-CM

## 2020-02-12 DIAGNOSIS — H3323 Serous retinal detachment, bilateral: Secondary | ICD-10-CM

## 2020-02-12 DIAGNOSIS — H25813 Combined forms of age-related cataract, bilateral: Secondary | ICD-10-CM

## 2020-02-12 DIAGNOSIS — H353132 Nonexudative age-related macular degeneration, bilateral, intermediate dry stage: Secondary | ICD-10-CM

## 2020-03-30 ENCOUNTER — Other Ambulatory Visit: Payer: Self-pay

## 2020-03-30 ENCOUNTER — Other Ambulatory Visit: Payer: Medicare Other

## 2020-03-30 DIAGNOSIS — Z8546 Personal history of malignant neoplasm of prostate: Secondary | ICD-10-CM

## 2020-03-30 DIAGNOSIS — R9721 Rising PSA following treatment for malignant neoplasm of prostate: Secondary | ICD-10-CM

## 2020-03-31 LAB — PSA: Prostate Specific Ag, Serum: 1.9 ng/mL (ref 0.0–4.0)

## 2020-03-31 NOTE — Progress Notes (Signed)
Subjective:  1. Personal history of prostate cancer   2. Rising PSA following treatment for malignant neoplasm of prostate   3. Urgency of urination   4. Radiation cystitis      [name] has a history of a seed implant for T1c Gleason 6 low risk prostate cancer on 01/05/10. His PSA is back down to 1.9 from  2.1 at his last visit and 1.7 in 9/20.  His nadir was 0.48 in 12/14.  A PSMA PET on 01/14/20 was negative.  His testosterone was 376 on 01/13/20.  He has also had a prior TURP in 2009. He had hematuria in 4/13 and was found to have radiation changes in the prostate on cystoscopy.  He had further hematuria in 9/20 with repeat cystoscopy on 10/07/18 demonstrating radiation changes again. He has had no further hematuria.  He has no nocturia x 2. He still has some urgency and UUI only if he delays voiding a long time. His IPSS is 14.  He tried oxybutynin for a month but it didn't help.   He has some intermittent frequency. He has some decrease in his stream with some intermittency but he feels like he is emptying. There are no changes in his voiding symptoms.   He has no associated signs or symptoms.     IPSS    Row Name 04/01/20 1000         International Prostate Symptom Score   How often have you had the sensation of not emptying your bladder? Less than 1 in 5     How often have you had to urinate less than every two hours? About half the time     How often have you found you stopped and started again several times when you urinated? About half the time     How often have you found it difficult to postpone urination? Almost always     How often have you had a weak urinary stream? Not at All     How often have you had to strain to start urination? Not at All     How many times did you typically get up at night to urinate? 2 Times     Total IPSS Score 14           Quality of Life due to urinary symptoms   If you were to spend the rest of your life with your urinary condition just the way  it is now how would you feel about that? Mostly Disatisfied           His UA is clear today.  ROS:  ROS:  A complete review of systems was performed.  All systems are negative except for pertinent findings as noted.   Review of Systems    Allergies  Allergen Reactions  . Pollen Extract Other (See Comments)    Runny nose, congestion, cough    Outpatient Encounter Medications as of 04/01/2020  Medication Sig  . amLODipine (NORVASC) 10 MG tablet TAKE 1 TABLET EVERY DAY  . ascorbic acid (VITAMIN C) 500 MG tablet Take 500 mg by mouth daily.  [name] Kitchen aspirin EC 81 MG EC tablet Take 1 tablet (81 mg total) by mouth daily.  [name] Kitchen atorvastatin (LIPITOR) 40 MG tablet TAKE 1 TABLET BY MOUTH EVERY DAY AT 6PM  . Bepotastine Besilate 1.5 % SOLN Place 1 drop into both eyes as needed.  . Calcium Carb-Cholecalciferol (CALCIUM CARBONATE-VITAMIN D3 PO) Take 1 tablet by mouth 2 (two) times daily. Calcium  600 mg and Vitamin  D 800 units  . carvedilol (COREG) 25 MG tablet Take 25 mg by mouth 2 (two) times daily.  [name] Kitchen CLINPRO 5000 1.1 % PSTE See admin instructions.  . ferrous sulfate 325 (65 FE) MG tablet Take 1 tablet (325 mg total) by mouth 2 (two) times daily with a meal.  . hydrochlorothiazide (HYDRODIURIL) 25 MG tablet Take 1 tablet (25 mg total) by mouth daily. Please call [phone] to schedule for additional refill.  . losartan (COZAAR) 100 MG tablet Take 1 tablet (100 mg total) by mouth daily. NEED OV.  . metFORMIN (GLUMETZA) 500 MG (MOD) 24 hr tablet Take 500 mg by mouth 2 (two) times daily with a meal.  . Omega-3 Fatty Acids (FISH OIL) 1000 MG CPDR Take 1,200 mg by mouth 2 (two) times daily.  [name] Kitchen omeprazole (PRILOSEC) 20 MG capsule Take 20 mg by mouth 2 (two) times daily before a meal.  . Vibegron (GEMTESA) 75 MG TABS Take 75 mg by mouth daily.  . vitamin E 1000 UNIT capsule Take by mouth.   Facility-Administered Encounter Medications as of 04/01/2020  Medication  . 0.9 %  sodium chloride infusion     Past Medical History:  Diagnosis Date  . Arthritis   . Cataract    Mixed form OU  . Colon polyps   . Dental infection 10/2016  . Diabetes mellitus   . GERD (gastroesophageal reflux disease)   . Heart murmur   . Hyperlipemia   . Hypertension   . Hypertensive retinopathy    OU  . Macular degeneration    Dry OU  . Prostate cancer ([place]) 2011  . Seasonal allergies   . Sleep apnea     Past Surgical History:  Procedure Laterality Date  . CARDIAC CATHETERIZATION N/A 02/26/2015   Procedure: Left Heart Cath and Coronary Angiography;  Surgeon: Belva Crome, MD;  Location: [facility];  Service: Cardiovascular;  Laterality: N/A;  . ELECTROPHYSIOLOGIC STUDY N/A 05/03/2015   Procedure: SVT Ablation;  Surgeon: Will Meredith Leeds, MD;  Location: [facility];  Service: Cardiovascular;  Laterality: N/A;  . FLEXIBLE SIGMOIDOSCOPY N/A 03/23/2013   Procedure: FLEXIBLE SIGMOIDOSCOPY;  Surgeon: Beryle Beams, MD;  [facility]: [facility];  Service: Endoscopy;  Laterality: N/A;  . INGUINAL HERNIA REPAIR  1992   left  . MOUTH SURGERY Left 12/22/2016   Top  . NASAL SINUS SURGERY  02/01/15  . TRANSURETHRAL RESECTION OF PROSTATE     seeds implanted    Social History   Socioeconomic History  . Marital status: Married    Spouse name: Not on file  . Number of children: Not on file  . Years of education: Not on file  . Highest education level: Not on file  Occupational History  . Not on file  Tobacco Use  . Smoking status: Never Smoker  . Smokeless tobacco: Never Used  Vaping Use  . Vaping Use: Never used  Substance and Sexual Activity  . Alcohol use: No  . Drug use: No  . Sexual activity: Never  Other Topics Concern  . Not on file  Social History Narrative  . Not on file   Social Determinants of Health   Financial Resource Strain: Not on file  Food Insecurity: Not on file  Transportation Needs: Not on file  Physical Activity: Not on file  Stress: Not on file   Social Connections: Not on file  Intimate Partner Violence: Not on file    Family  History  Problem Relation Age of Onset  . Colon cancer Brother 71  . Colon cancer Brother   . Stomach cancer Neg Hx        Objective: Vitals:   04/01/20 0956  BP: (!) 167/76  Pulse: 69  Temp: 98.6 F (37 C)     Physical Exam  Lab Results:  Results for orders placed or performed in visit on 04/01/20 (from the past 24 hour(s))  Urinalysis, Routine w reflex microscopic     Status: Abnormal   Collection Time: 04/01/20 10:01 AM  Result Value Ref Range   Specific Gravity, UA 1.015 1.005 - 1.030   pH, UA 5.5 5.0 - 7.5   Color, UA Yellow Yellow   Appearance Ur Clear Clear   Leukocytes,UA Negative Negative   Protein,UA Negative Negative/Trace   Glucose, UA Trace (A) Negative   Ketones, UA Trace (A) Negative   RBC, UA Negative Negative   Bilirubin, UA Negative Negative   Urobilinogen, Ur 0.2 0.2 - 1.0 mg/dL   Nitrite, UA Negative Negative   Microscopic Examination Comment    Narrative   Performed at:  [facility] [address], [place], [place]  [id number] Lab Director: Mina Marble MT, Phone:  [phone]    BMET No results for input(s): NA, K, CL, CO2, GLUCOSE, BUN, CREATININE, CALCIUM in the last 72 hours. PSA PSA  Date Value Ref Range Status  06/24/2019 1.5 < OR = 4.0 ng/mL Final    Comment:    The total PSA value from this assay system is  standardized against the WHO standard. The test  result will be approximately 20% lower when compared  to the equimolar-standardized total PSA (Beckman  Coulter). Comparison of serial PSA results should be  interpreted with this fact in mind. . This test was performed using the Siemens  chemiluminescent method. Values obtained from  different assay methods cannot be used interchangeably. PSA levels, regardless of value, should not be interpreted as absolute evidence of the presence or absence of disease.   03/01/2015  0.89 0.00 - 4.00 ng/mL Final    Comment:    (NOTE) While PSA levels of <=4.0 ng/ml are reported as reference range, some men with levels below 4.0 ng/ml can have prostate cancer and many men with PSA above 4.0 ng/ml do not have prostate cancer.  Other tests such as free PSA, age specific reference ranges, PSA velocity and PSA doubling time may be helpful especially in men less than 3 years old.    Testosterone  Date Value Ref Range Status  12/25/2019 376 264 - 916 ng/dL Final    Comment:    Adult male reference interval is based on a population of healthy nonobese males (BMI <30) between 53 and 101 years old. [place], [place] [phone]. PMID: [id number].    PSA 0.89 2/17 PSA 1.5  6/21 PSA 2.1  12/21 PSA 1.9   03/30/20  UA is clear.  Studies/Results: PSMA PET reviewed.     Assessment & Plan: History of prostate cancer with slowly rising PSA s/p seeds.  His PSA is back down to 1.9 from 2.1. The PET scan was negative.  I will have him return in 6 months with a PSA.  Urgency.  I will given him Gemtesa samples and he will call for a script if it is helpful.  I reviewed the instructions and side effects.     Meds ordered this encounter  Medications  . Vibegron (GEMTESA) 75 MG TABS  Sig: Take 75 mg by mouth daily.    Dispense:  28 tablet    Refill:  0     Orders Placed This Encounter  Procedures  . Urinalysis, Routine w reflex microscopic  . PSA    Standing Status:   Future    Standing Expiration Date:   04/01/2021      Return in about 6 months (around 10/02/2020) for PSA prior to f/u.[name] Kitchen   CC: Leanna Battles, MD      Irine Seal 04/01/2020

## 2020-04-01 ENCOUNTER — Ambulatory Visit (INDEPENDENT_AMBULATORY_CARE_PROVIDER_SITE_OTHER): Payer: Medicare Other | Admitting: Urology

## 2020-04-01 ENCOUNTER — Encounter: Payer: Self-pay | Admitting: Urology

## 2020-04-01 ENCOUNTER — Other Ambulatory Visit: Payer: Self-pay

## 2020-04-01 VITALS — BP 167/76 | HR 69 | Temp 98.6°F | Ht 68.5 in | Wt 175.0 lb

## 2020-04-01 DIAGNOSIS — R3915 Urgency of urination: Secondary | ICD-10-CM | POA: Diagnosis not present

## 2020-04-01 DIAGNOSIS — R9721 Rising PSA following treatment for malignant neoplasm of prostate: Secondary | ICD-10-CM

## 2020-04-01 DIAGNOSIS — Z8546 Personal history of malignant neoplasm of prostate: Secondary | ICD-10-CM | POA: Diagnosis not present

## 2020-04-01 DIAGNOSIS — N3040 Irradiation cystitis without hematuria: Secondary | ICD-10-CM | POA: Diagnosis not present

## 2020-04-01 LAB — URINALYSIS, ROUTINE W REFLEX MICROSCOPIC
Bilirubin, UA: NEGATIVE
Leukocytes,UA: NEGATIVE
Nitrite, UA: NEGATIVE
Protein,UA: NEGATIVE
RBC, UA: NEGATIVE
Specific Gravity, UA: 1.015 (ref 1.005–1.030)
Urobilinogen, Ur: 0.2 mg/dL (ref 0.2–1.0)
pH, UA: 5.5 (ref 5.0–7.5)

## 2020-04-01 MED ORDER — GEMTESA 75 MG PO TABS: 75.0000 mg | ORAL_TABLET | Freq: Every day | ORAL | 0 refills | Status: DC

## 2020-04-01 NOTE — Progress Notes (Signed)

## 2020-04-26 ENCOUNTER — Other Ambulatory Visit: Payer: Self-pay

## 2020-04-26 ENCOUNTER — Telehealth: Payer: Self-pay

## 2020-04-26 DIAGNOSIS — R3915 Urgency of urination: Secondary | ICD-10-CM

## 2020-04-26 MED ORDER — GEMTESA 75 MG PO TABS: 75.0000 mg | ORAL_TABLET | Freq: Every day | ORAL | 6 refills | Status: DC

## 2020-04-26 NOTE — Progress Notes (Signed)
Prescription sent into pharmacy for patient for [facility]. Pt notified prescription sent.

## 2020-04-26 NOTE — Telephone Encounter (Signed)
Pt called in to say Robert Mcknight was helping him and he wanted prescription. I explained to pt Dr. Jeffie Pollock had sent in a prescription in March for med. Pt calling pharmacy and will call back if needed.

## 2020-05-07 NOTE — Progress Notes (Signed)
[facility] Note  05/12/2020     CHIEF COMPLAINT Patient presents for Retina Follow Up   HISTORY OF PRESENT ILLNESS: Robert Mcknight is a 87 y.o. male who presents to the clinic today for:   HPI    Retina Follow Up    Patient presents with  Other.  In both eyes.  This started 3 months ago.  I, the attending physician,  performed the HPI with the patient and updated documentation appropriately.          Comments    Patient here for 3 months retina follow up for lattice degeneration OU. Patient states vision doing fine. Vision clear. When in am OD feels like a skim over vision. When seeing something small not as clear as used to be. No eye pain.       Last edited by Bernarda Caffey, MD on 05/14/2020 12:19 AM. (History)    Patient states vision is stable, he states when he wakes up his right eye feels like it has a "skim" over it, but it gets better throughout the day  Referring physician: Marylynn Pearson, MD [address],  [place] [address]  HISTORICAL INFORMATION:   Selected notes from the MEDICAL RECORD NUMBER Referred by Dr. Marylynn Pearson for concern of retinal holes   CURRENT MEDICATIONS: Current Outpatient Medications (Ophthalmic Drugs)  Medication Sig  . Bepotastine Besilate 1.5 % SOLN Place 1 drop into both eyes as needed.   No current facility-administered medications for this visit. (Ophthalmic Drugs)   Current Outpatient Medications (Other)  Medication Sig  . amLODipine (NORVASC) 10 MG tablet TAKE 1 TABLET EVERY DAY  . ascorbic acid (VITAMIN C) 500 MG tablet Take 500 mg by mouth daily.  [name] Kitchen aspirin EC 81 MG EC tablet Take 1 tablet (81 mg total) by mouth daily.  [name] Kitchen atorvastatin (LIPITOR) 40 MG tablet TAKE 1 TABLET BY MOUTH EVERY DAY AT 6PM  . Calcium Carb-Cholecalciferol (CALCIUM CARBONATE-VITAMIN D3 PO) Take 1 tablet by mouth 2 (two) times daily. Calcium 600 mg and Vitamin  D 800 units  . carvedilol (COREG) 25 MG tablet Take  25 mg by mouth 2 (two) times daily.  [name] Kitchen CLINPRO 5000 1.1 % PSTE See admin instructions.  . ferrous sulfate 325 (65 FE) MG tablet Take 1 tablet (325 mg total) by mouth 2 (two) times daily with a meal.  . hydrochlorothiazide (HYDRODIURIL) 25 MG tablet Take 1 tablet (25 mg total) by mouth daily. Please call [phone] to schedule for additional refill.  . losartan (COZAAR) 100 MG tablet Take 1 tablet (100 mg total) by mouth daily. NEED OV.  . metFORMIN (GLUMETZA) 500 MG (MOD) 24 hr tablet Take 500 mg by mouth 2 (two) times daily with a meal.  . Omega-3 Fatty Acids (FISH OIL) 1000 MG CPDR Take 1,200 mg by mouth 2 (two) times daily.  [name] Kitchen omeprazole (PRILOSEC) 20 MG capsule Take 20 mg by mouth 2 (two) times daily before a meal.  . Vibegron (GEMTESA) 75 MG TABS Take 75 mg by mouth daily.  . vitamin E 1000 UNIT capsule Take by mouth.   Current Facility-Administered Medications (Other)  Medication Route  . 0.9 %  sodium chloride infusion Intravenous      REVIEW OF SYSTEMS: ROS    Positive for: Musculoskeletal, Cardiovascular, Eyes, Respiratory   Negative for: Constitutional, Gastrointestinal, Neurological, Skin, Genitourinary, HENT, Endocrine, Psychiatric, Allergic/Imm, Heme/Lymph   Last edited by Theodore Demark, COA on 05/12/2020  2:19 PM. (History)       ALLERGIES Allergies  Allergen Reactions  . Pollen Extract Other (See Comments)    Runny nose, congestion, cough    PAST MEDICAL HISTORY Past Medical History:  Diagnosis Date  . Arthritis   . Cataract    Mixed form OU  . Colon polyps   . Dental infection 10/2016  . Diabetes mellitus   . GERD (gastroesophageal reflux disease)   . Heart murmur   . Hyperlipemia   . Hypertension   . Hypertensive retinopathy    OU  . Macular degeneration    Dry OU  . Prostate cancer ([place]) 2011  . Seasonal allergies   . Sleep apnea    Past Surgical History:  Procedure Laterality Date  . CARDIAC CATHETERIZATION N/A 02/26/2015   Procedure:  Left Heart Cath and Coronary Angiography;  Surgeon: Belva Crome, MD;  Location: [facility];  Service: Cardiovascular;  Laterality: N/A;  . ELECTROPHYSIOLOGIC STUDY N/A 05/03/2015   Procedure: SVT Ablation;  Surgeon: Will Meredith Leeds, MD;  Location: [facility];  Service: Cardiovascular;  Laterality: N/A;  . FLEXIBLE SIGMOIDOSCOPY N/A 03/23/2013   Procedure: FLEXIBLE SIGMOIDOSCOPY;  Surgeon: Beryle Beams, MD;  Location: [facility];  Service: Endoscopy;  Laterality: N/A;  . INGUINAL HERNIA REPAIR  1992   left  . MOUTH SURGERY Left 12/22/2016   Top  . NASAL SINUS SURGERY  02/01/15  . TRANSURETHRAL RESECTION OF PROSTATE     seeds implanted    FAMILY HISTORY Family History  Problem Relation Age of Onset  . Colon cancer Brother 54  . Colon cancer Brother   . Stomach cancer Neg Hx     SOCIAL HISTORY Social History   Tobacco Use  . Smoking status: Never Smoker  . Smokeless tobacco: Never Used  Vaping Use  . Vaping Use: Never used  Substance Use Topics  . Alcohol use: No  . Drug use: No         OPHTHALMIC EXAM:  Base Eye Exam    Visual Acuity (Snellen - Linear)      Right Left   Dist [place] 20/25 -2 20/30 +1   Dist ph [place] NI 20/25 -2       Tonometry (Tonopen, 2:15 PM)      Right Left   Pressure 15 13       Pupils      Dark Light Shape React APD   Right 3 2 Round Minimal None   Left 3 2 Round Minimal None       Visual Fields (Counting fingers)      Left Right    Full Full       Extraocular Movement      Right Left    Full Full       Neuro/Psych    Oriented x3: Yes   Mood/Affect: Normal       Dilation    Both eyes: 1.0% Mydriacyl, 2.5% Phenylephrine @ 2:15 PM        Slit Lamp and Fundus Exam    Slit Lamp Exam      Right Left   Lids/Lashes Dermato Dermato   Conjunctiva/Sclera Nasal and temporal pinguecula, mild melanosis Nasal and temporal pinguecula, mild melanosis   Cornea Arcus, trace PEE, fine endopigment, EBMD Arcus, trace PEE  and fine endopigment   Anterior Chamber Deep and quiet Deep and quiet   Iris Round and dilated Round and dilated   Lens 2-3+ NS, 2-3+  cortical 2+ NS, 2+ cortical   Vitreous Synerisis, PVD, condensations Synerisis, PVD, condensations       Fundus Exam      Right Left   Disc Pink, sharp, compact Pink, sharp, compact   C/D Ratio 0.2 0.2   Macula Flat, blunted foveal reflex, drusen, RPE mottling and clumping Flat, blunted foveal reflex, +drusen, RPE mottling and clumping   Vessels Attenuated, tortuous, mild copper wiring Attenuated, tortuous, mild copper wiring, av crossing changes   Periphery Attached, Round hole w/SRF and surrounding pigment at 0700--incomplete pigment surrounding, focal patch of lattice at 0600, focal lattice at 1100--good laser changes surrounding all lesions, scattered peripheral drusen, no new RT/RD Attached, Operculated hole at 0430 w/ +cuff of SRF and incomplete pigment surrounding, lattice w/ atrophic holes from 0500-0600 -- good laser surrounding all lesions, scattered peripheral drusen, No new RT/RD          IMAGING AND PROCEDURES  Imaging and Procedures for 05/12/2020  OCT, Retina - OU - Both Eyes       Right Eye Quality was good. Central Foveal Thickness: 248. Progression has been stable. Findings include normal foveal contour, no IRF, no SRF, retinal drusen , pigment epithelial detachment (Rare focal drusen nasal mac, trace ERM).   Left Eye Quality was good. Central Foveal Thickness: 247. Progression has been stable. Findings include normal foveal contour, no IRF, no SRF, retinal drusen  (Mild scattered drusen, trace ERM).   Notes *Images captured and stored on drive  Diagnosis / Impression:  NFP; no IRF/SRF OU OD: Rare focal drusen nasal mac, trace ERM OS: Mild scattered drusen, trace ERM  Clinical management:  See below  Abbreviations: NFP - Normal foveal profile. CME - cystoid macular edema. PED - pigment epithelial detachment. IRF -  intraretinal fluid. SRF - subretinal fluid. EZ - ellipsoid zone. ERM - epiretinal membrane. ORA - outer retinal atrophy. ORT - outer retinal tubulation. SRHM - subretinal hyper-reflective material. IRHM - intraretinal hyper-reflective material                ASSESSMENT/PLAN:    ICD-10-CM   1. Lattice degeneration of both retinas  H35.413   2. Retinal hole of both eyes  H33.323   3. Bilateral retinal detachment  H33.23   4. Retinal edema  H35.81 OCT, Retina - OU - Both Eyes  5. Intermediate stage nonexudative age-related macular degeneration of both eyes  H35.3132   6. Epiretinal membrane (ERM) of both eyes  H35.373   7. Essential hypertension  I10   8. Hypertensive retinopathy of both eyes  H35.033   9. Combined forms of age-related cataract of both eyes  H25.813    1-4. Lattice degeneration w/ atrophic holes and focal RD OU  - OD: lattice from 6-7 with round hole w/ +SRF/focal RD and incomplete pigmentation at 7; mild lattice at 1100 with good laser surrounding all lesions - OS: focal lattice w/ operculated hole and +SRF/focal RD at 430; lattice w/ atrophic holes 0500-0600  - s/p laser retinopexy OD 12.17.21 -- good laser changes all lesions  - s/p laser retinopexy OS 12.21.21 -- good laser changes all lesions   - no new RT/RD or lattice - pt is clear from a retina standpoint for release to Dr. Venetia Maxon and resumption of primary eye care      5. Age related macular degeneration, non-exudative, OU  - intermediate stage with mild drusen OU  - The incidence, anatomy, and pathology of dry AMD, risk of progression, and the AREDS  and AREDS 2 study including smoking risks discussed with patient.  - Recommend amsler grid monitoring  6. Mild epiretinal membrane OU - mild ERM OU - BCVA 20/25 OU - asymptomatic, no metamorphopsia - no indication for surgery at this time - monitor for now  7,8. Hypertensive retinopathy OU - discussed importance of tight BP control - monitor  9.  Mixed Cataract OU - The symptoms of cataract, surgical options, and treatments and risks were discussed with patient - discussed diagnosis and progression - monitor  Ophthalmic Meds Ordered this visit:  No orders of the defined types were placed in this encounter.     Return if symptoms worsen or fail to improve.  There are no Patient Instructions on file for this visit.  Explained the diagnoses, plan, and follow up with the patient and they expressed understanding.  Patient expressed understanding of the importance of proper follow up care.  This document serves as a record of services personally performed by Gardiner Sleeper, MD, PhD. It was created on their behalf by Roselee Nova, COMT. The creation of this record is the provider's dictation and/or activities during the visit.  Electronically signed by: Roselee Nova, COMT 05/14/20 12:37 AM  This document serves as a record of services personally performed by Gardiner Sleeper, MD, PhD. It was created on their behalf by San Jetty. Owens Shark, OA an ophthalmic technician. The creation of this record is the provider's dictation and/or activities during the visit.    Electronically signed by: San Jetty. Owens Shark, OA _0 @ 12:37 AM  Gardiner Sleeper, M.D., Ph.D. Diseases & Surgery of the Retina and Vitreous [facility]  I have reviewed the above documentation for accuracy and completeness, and I agree with the above. Gardiner Sleeper, M.D., Ph.D. 05/14/20 12:37 AM   Abbreviations: M myopia (nearsighted); A astigmatism; H hyperopia (farsighted); P presbyopia; Mrx spectacle prescription;  CTL contact lenses; OD right eye; OS left eye; OU both eyes  XT exotropia; ET esotropia; PEK punctate epithelial keratitis; PEE punctate epithelial erosions; DES dry eye syndrome; MGD meibomian gland dysfunction; ATs artificial tears; PFAT's preservative free artificial tears; [place] nuclear sclerotic cataract; PSC posterior subcapsular cataract;  ERM epi-retinal membrane; PVD posterior vitreous detachment; RD retinal detachment; DM diabetes mellitus; DR diabetic retinopathy; NPDR non-proliferative diabetic retinopathy; PDR proliferative diabetic retinopathy; CSME clinically significant macular edema; DME diabetic macular edema; dbh dot blot hemorrhages; CWS cotton wool spot; POAG primary open angle glaucoma; C/D cup-to-disc ratio; HVF humphrey visual field; GVF goldmann visual field; OCT optical coherence tomography; IOP intraocular pressure; BRVO Branch retinal vein occlusion; CRVO central retinal vein occlusion; CRAO central retinal artery occlusion; BRAO branch retinal artery occlusion; RT retinal tear; SB scleral buckle; PPV pars plana vitrectomy; VH Vitreous hemorrhage; PRP panretinal laser photocoagulation; IVK intravitreal kenalog; VMT vitreomacular traction; MH Macular hole;  NVD neovascularization of the disc; NVE neovascularization elsewhere; AREDS age related eye disease study; ARMD age related macular degeneration; POAG primary open angle glaucoma; EBMD epithelial/anterior basement membrane dystrophy; ACIOL anterior chamber intraocular lens; IOL intraocular lens; PCIOL posterior chamber intraocular lens; Phaco/IOL phacoemulsification with intraocular lens placement; [place] photorefractive keratectomy; LASIK laser assisted in situ keratomileusis; HTN hypertension; DM diabetes mellitus; COPD chronic obstructive pulmonary disease

## 2020-05-12 ENCOUNTER — Other Ambulatory Visit: Payer: Self-pay

## 2020-05-12 ENCOUNTER — Ambulatory Visit (INDEPENDENT_AMBULATORY_CARE_PROVIDER_SITE_OTHER): Payer: Medicare Other | Admitting: Ophthalmology

## 2020-05-12 ENCOUNTER — Encounter (INDEPENDENT_AMBULATORY_CARE_PROVIDER_SITE_OTHER): Payer: Self-pay | Admitting: Ophthalmology

## 2020-05-12 DIAGNOSIS — H35413 Lattice degeneration of retina, bilateral: Secondary | ICD-10-CM | POA: Diagnosis not present

## 2020-05-12 DIAGNOSIS — I10 Essential (primary) hypertension: Secondary | ICD-10-CM

## 2020-05-12 DIAGNOSIS — H3323 Serous retinal detachment, bilateral: Secondary | ICD-10-CM

## 2020-05-12 DIAGNOSIS — H353132 Nonexudative age-related macular degeneration, bilateral, intermediate dry stage: Secondary | ICD-10-CM

## 2020-05-12 DIAGNOSIS — H3581 Retinal edema: Secondary | ICD-10-CM

## 2020-05-12 DIAGNOSIS — H33323 Round hole, bilateral: Secondary | ICD-10-CM

## 2020-05-12 DIAGNOSIS — H35373 Puckering of macula, bilateral: Secondary | ICD-10-CM

## 2020-05-12 DIAGNOSIS — H25813 Combined forms of age-related cataract, bilateral: Secondary | ICD-10-CM

## 2020-05-12 DIAGNOSIS — H35033 Hypertensive retinopathy, bilateral: Secondary | ICD-10-CM

## 2020-05-14 ENCOUNTER — Encounter (INDEPENDENT_AMBULATORY_CARE_PROVIDER_SITE_OTHER): Payer: Self-pay | Admitting: Ophthalmology

## 2020-09-23 ENCOUNTER — Other Ambulatory Visit: Payer: Medicare Other

## 2020-09-23 ENCOUNTER — Other Ambulatory Visit: Payer: Self-pay

## 2020-09-23 DIAGNOSIS — Z8546 Personal history of malignant neoplasm of prostate: Secondary | ICD-10-CM

## 2020-09-24 LAB — PSA: Prostate Specific Ag, Serum: 2.2 ng/mL (ref 0.0–4.0)

## 2020-09-30 ENCOUNTER — Ambulatory Visit: Payer: Medicare Other | Admitting: Urology

## 2020-09-30 NOTE — Progress Notes (Signed)
Sent via mail 

## 2020-10-01 ENCOUNTER — Telehealth: Payer: Self-pay | Admitting: Urology

## 2020-10-01 NOTE — Telephone Encounter (Signed)
Pt's wife called wanting to get her husbands PSA results. Please call her when you can.

## 2020-10-01 NOTE — Telephone Encounter (Signed)
Message left that MD is out of office all week and has not reviewed results. Will review when he returns.

## 2020-11-23 ENCOUNTER — Other Ambulatory Visit: Payer: Medicare Other

## 2020-11-23 ENCOUNTER — Other Ambulatory Visit: Payer: Self-pay

## 2020-12-02 ENCOUNTER — Other Ambulatory Visit: Payer: Self-pay

## 2020-12-02 ENCOUNTER — Ambulatory Visit: Payer: Medicare Other | Admitting: Urology

## 2020-12-02 ENCOUNTER — Encounter: Payer: Self-pay | Admitting: Urology

## 2020-12-02 VITALS — BP 170/84 | HR 61 | Temp 98.3°F | Wt 166.8 lb

## 2020-12-02 DIAGNOSIS — R350 Frequency of micturition: Secondary | ICD-10-CM | POA: Diagnosis not present

## 2020-12-02 DIAGNOSIS — N3040 Irradiation cystitis without hematuria: Secondary | ICD-10-CM | POA: Diagnosis not present

## 2020-12-02 DIAGNOSIS — R9721 Rising PSA following treatment for malignant neoplasm of prostate: Secondary | ICD-10-CM

## 2020-12-02 DIAGNOSIS — Z8546 Personal history of malignant neoplasm of prostate: Secondary | ICD-10-CM

## 2020-12-02 DIAGNOSIS — R3915 Urgency of urination: Secondary | ICD-10-CM

## 2020-12-02 LAB — URINALYSIS, ROUTINE W REFLEX MICROSCOPIC
Bilirubin, UA: NEGATIVE
Glucose, UA: NEGATIVE
Leukocytes,UA: NEGATIVE
Nitrite, UA: NEGATIVE
Protein,UA: NEGATIVE
RBC, UA: NEGATIVE
Specific Gravity, UA: 1.02 (ref 1.005–1.030)
Urobilinogen, Ur: 0.2 mg/dL (ref 0.2–1.0)
pH, UA: 6.5 (ref 5.0–7.5)

## 2020-12-02 NOTE — Progress Notes (Signed)
Urological Symptom Review  Patient is experiencing the following symptoms: Frequent urination Hard to postpone urination Get up at night to urinate Leakage of urine   Review of Systems  Gastrointestinal (upper)  : Negative for upper GI symptoms  Gastrointestinal (lower) : Negative for lower GI symptoms  Constitutional : Weight loss  Skin: Negative for skin symptoms  Eyes: Negative for eye symptoms  Ear/Nose/Throat : Negative for Ear/Nose/Throat symptoms  Hematologic/Lymphatic: Negative for Hematologic/Lymphatic symptoms  Cardiovascular : Negative for cardiovascular symptoms  Respiratory : Negative for respiratory symptoms  Endocrine: Negative for endocrine symptoms  Musculoskeletal: Negative for musculoskeletal symptoms  Neurological: Negative for neurological symptoms  Psychologic: Negative for psychiatric symptoms

## 2020-12-02 NOTE — Progress Notes (Signed)
Subjective:  1. Personal history of prostate cancer   2. Rising PSA following treatment for malignant neoplasm of prostate   3. Radiation cystitis   4. Urinary frequency   5. Urgency of urination      [name] has a history of a seed implant for T1c Gleason 6 low risk prostate cancer on 01/05/10. His PSA was 2.2 prior to this visit.  It was 1.9 3/22 but was  2.1 a year ago and 1.7 in 9/20.  His nadir was 0.48 in 12/14.  A PSMA PET on 01/14/20 was negative.  His testosterone was 376 on 01/13/20.  He has also had a prior TURP in 2009. He had hematuria in 4/13 and was found to have radiation changes in the prostate on cystoscopy.  He had further hematuria in 9/20 with repeat cystoscopy on 10/07/18 demonstrating radiation changes again. He has had no further hematuria.  He has no nocturia x 2-3. He still has some urgency and UUI only if he delays voiding a long time. His IPSS is 19.  He tried oxybutynin for a month but it didn't help.  He was given Gemtesa at his last visit and it didn't help.    He has lost about 13 lbs over the last 8 months ago but has no other associated signs or symptoms.       His UA is clear today.   IPSS     Row Name 12/02/20 1100         International Prostate Symptom Score   How often have you had the sensation of not emptying your bladder? About half the time     How often have you had to urinate less than every two hours? More than half the time     How often have you found you stopped and started again several times when you urinated? More than half the time     How often have you found it difficult to postpone urination? Almost always     How often have you had a weak urinary stream? Not at All     How often have you had to strain to start urination? Not at All     How many times did you typically get up at night to urinate? 3 Times     Total IPSS Score 19       Quality of Life due to urinary symptoms   If you were to spend the rest of your life with your  urinary condition just the way it is now how would you feel about that? Mixed               ROS:  ROS:  A complete review of systems was performed.  All systems are negative except for pertinent findings as noted.   ROS  Allergies  Allergen Reactions   Pollen Extract Other (See Comments)    Runny nose, congestion, cough    Outpatient Encounter Medications as of 12/02/2020  Medication Sig   amLODipine (NORVASC) 10 MG tablet TAKE 1 TABLET EVERY DAY   ascorbic acid (VITAMIN C) 500 MG tablet Take 500 mg by mouth daily.   aspirin EC 81 MG EC tablet Take 1 tablet (81 mg total) by mouth daily.   atorvastatin (LIPITOR) 40 MG tablet TAKE 1 TABLET BY MOUTH EVERY DAY AT 6PM   Bepotastine Besilate 1.5 % SOLN Place 1 drop into both eyes as needed.   Calcium Carb-Cholecalciferol (CALCIUM CARBONATE-VITAMIN D3 PO) Take 1 tablet by  mouth 2 (two) times daily. Calcium 600 mg and Vitamin  D 800 units   carvedilol (COREG) 25 MG tablet Take 25 mg by mouth 2 (two) times daily.   CLINPRO 5000 1.1 % PSTE See admin instructions.   ferrous sulfate 325 (65 FE) MG tablet Take 1 tablet (325 mg total) by mouth 2 (two) times daily with a meal.   hydrochlorothiazide (HYDRODIURIL) 25 MG tablet Take 1 tablet (25 mg total) by mouth daily. Please call [phone] to schedule for additional refill.   losartan (COZAAR) 100 MG tablet Take 1 tablet (100 mg total) by mouth daily. NEED OV.   metFORMIN (GLUMETZA) 500 MG (MOD) 24 hr tablet Take 500 mg by mouth 2 (two) times daily with a meal.   Omega-3 Fatty Acids (FISH OIL) 1000 MG CPDR Take 1,200 mg by mouth 2 (two) times daily.   omeprazole (PRILOSEC) 20 MG capsule Take 20 mg by mouth 2 (two) times daily before a meal.   vitamin E 1000 UNIT capsule Take by mouth.   [DISCONTINUED] Vibegron (GEMTESA) 75 MG TABS Take 75 mg by mouth daily.   Facility-Administered Encounter Medications as of 12/02/2020  Medication   0.9 %  sodium chloride infusion    Past Medical  History:  Diagnosis Date   Arthritis    Cataract    Mixed form OU   Colon polyps    Dental infection 10/2016   Diabetes mellitus    GERD (gastroesophageal reflux disease)    Heart murmur    Hyperlipemia    Hypertension    Hypertensive retinopathy    OU   Macular degeneration    Dry OU   Prostate cancer ([place]) 2011   Seasonal allergies    Sleep apnea     Past Surgical History:  Procedure Laterality Date   CARDIAC CATHETERIZATION N/A 02/26/2015   Procedure: Left Heart Cath and Coronary Angiography;  Surgeon: Belva Crome, MD;  Location: [facility];  Service: Cardiovascular;  Laterality: N/A;   ELECTROPHYSIOLOGIC STUDY N/A 05/03/2015   Procedure: SVT Ablation;  Surgeon: Will Meredith Leeds, MD;  Location: [facility];  Service: Cardiovascular;  Laterality: N/A;   FLEXIBLE SIGMOIDOSCOPY N/A 03/23/2013   Procedure: FLEXIBLE SIGMOIDOSCOPY;  Surgeon: Beryle Beams, MD;  Location: [facility];  Service: Endoscopy;  Laterality: N/A;   INGUINAL HERNIA REPAIR  1992   left   MOUTH SURGERY Left 12/22/2016   Top   NASAL SINUS SURGERY  02/01/15   TRANSURETHRAL RESECTION OF PROSTATE     seeds implanted    Social History   Socioeconomic History   Marital status: Married    Spouse name: Not on file   Number of children: Not on file   Years of education: Not on file   Highest education level: Not on file  Occupational History   Not on file  Tobacco Use   Smoking status: Never   Smokeless tobacco: Never  Vaping Use   Vaping Use: Never used  Substance and Sexual Activity   Alcohol use: No   Drug use: No   Sexual activity: Never  Other Topics Concern   Not on file  Social History Narrative   Not on file   Social Determinants of Health   Financial Resource Strain: Not on file  Food Insecurity: Not on file  Transportation Needs: Not on file  Physical Activity: Not on file  Stress: Not on file  Social Connections: Not on file  Intimate Partner Violence: Not on  file    Family History  Problem Relation Age of Onset   Colon cancer Brother 61   Colon cancer Brother    Stomach cancer Neg Hx        Objective: Vitals:   12/02/20 1116  BP: (!) 170/84  Pulse: 61  Temp: 98.3 F (36.8 C)     Physical Exam Vitals reviewed.  Constitutional:      Appearance: Normal appearance.  Lymphadenopathy:     Cervical: No cervical adenopathy.     Upper Body:     Right upper body: No supraclavicular or axillary adenopathy.     Left upper body: No supraclavicular or axillary adenopathy.  Neurological:     Mental Status: He is alert.    Lab Results:  No results found for this or any previous visit (from the past 24 hour(s)).   BMET No results for input(s): NA, K, CL, CO2, GLUCOSE, BUN, CREATININE, CALCIUM in the last 72 hours. PSA PSA  Date Value Ref Range Status  06/24/2019 1.5 < OR = 4.0 ng/mL Final    Comment:    The total PSA value from this assay system is  standardized against the WHO standard. The test  result will be approximately 20% lower when compared  to the equimolar-standardized total PSA (Beckman  Coulter). Comparison of serial PSA results should be  interpreted with this fact in mind. . This test was performed using the Siemens  chemiluminescent method. Values obtained from  different assay methods cannot be used interchangeably. PSA levels, regardless of value, should not be interpreted as absolute evidence of the presence or absence of disease.   03/01/2015 0.89 0.00 - 4.00 ng/mL Final    Comment:    (NOTE) While PSA levels of <=4.0 ng/ml are reported as reference range, some men with levels below 4.0 ng/ml can have prostate cancer and many men with PSA above 4.0 ng/ml do not have prostate cancer.  Other tests such as free PSA, age specific reference ranges, PSA velocity and PSA doubling time may be helpful especially in men less than 23 years old.    Testosterone  Date Value Ref Range Status  12/25/2019 376  264 - 916 ng/dL Final    Comment:    Adult male reference interval is based on a population of healthy nonobese males (BMI <30) between 98 and 19 years old. [place], [place] [phone]. PMID: [id number].    Lab Results  Component Value Date   PSA1 2.2 09/23/2020   PSA1 1.9 03/30/2020   PSA1 2.1 12/25/2019     UA is clear.  Studies/Results:     Assessment & Plan: History of prostate cancer with slowly rising PSA s/p seeds.  His PSA is 2.2 which is up slightly but minimally changed over the last 9 month.   I will get a PSA today and will have him return in 6 months with a PSA.  Urgency with UUI.  He has stable LUTS that hasn't responded to oxybutynin or gemtesa.   I will set him up for PTNS    No orders of the defined types were placed in this encounter.    Orders Placed This Encounter  Procedures   Urinalysis, Routine w reflex microscopic   PSA   PSA    Standing Status:   Future    Standing Expiration Date:   12/02/2021      Return in about 6 months (around 06/01/2021) for He needs to come for PTNS induction therapy wkly x 12  then f/u with PSA in 6 months. .   CC: Leanna Battles, MD      Irine Seal 12/02/2020 Patient ID: Gustavus Bryant, male   DOB: April 22, 1933, 87 y.o.   MRN: [id number]

## 2020-12-03 LAB — PSA: Prostate Specific Ag, Serum: 2.2 ng/mL (ref 0.0–4.0)

## 2020-12-07 NOTE — Progress Notes (Signed)
Results mailed and sent via my chart

## 2020-12-21 ENCOUNTER — Other Ambulatory Visit: Payer: Self-pay

## 2020-12-21 ENCOUNTER — Ambulatory Visit (INDEPENDENT_AMBULATORY_CARE_PROVIDER_SITE_OTHER): Payer: Medicare Other

## 2020-12-21 DIAGNOSIS — R350 Frequency of micturition: Secondary | ICD-10-CM

## 2020-12-21 NOTE — Progress Notes (Signed)
PTNS  Session # 1 of 45  Health & Social Factors: diabetic and fluid pills Caffeine: 1 cup of coffee Alcohol: none Daytime voids #per day: 7-8 Night-time voids #per night: 3-4 Urgency: yes Incontinence Episodes #per day: 1/2 the time Ankle used: right Treatment Setting: 11 Feeling/ Response: positive sensation  Comments: n/a  Performed By: [facility] LPN  Follow Up: keep next scheduled NV

## 2020-12-28 ENCOUNTER — Other Ambulatory Visit: Payer: Self-pay

## 2020-12-28 ENCOUNTER — Ambulatory Visit (INDEPENDENT_AMBULATORY_CARE_PROVIDER_SITE_OTHER): Payer: Medicare Other

## 2020-12-28 DIAGNOSIS — R350 Frequency of micturition: Secondary | ICD-10-CM

## 2020-12-28 NOTE — Progress Notes (Signed)
PTNS  Session # 2 of 45  Health & Social Factors: Yes  Caffeine: none Alcohol: none Daytime voids #per day: 6+ Night-time voids #per night: 2 Urgency: yes Incontinence Episodes #per day: half Ankle used: right Treatment Setting: 15 Feeling/ Response: positive sensation Comments: n/a  Performed By: [facility] LPN  Follow Up: Keep next scheduled NV

## 2020-12-30 ENCOUNTER — Other Ambulatory Visit: Payer: Self-pay | Admitting: Internal Medicine

## 2020-12-30 DIAGNOSIS — R634 Abnormal weight loss: Secondary | ICD-10-CM

## 2021-01-04 ENCOUNTER — Ambulatory Visit (INDEPENDENT_AMBULATORY_CARE_PROVIDER_SITE_OTHER): Payer: Medicare Other

## 2021-01-04 ENCOUNTER — Other Ambulatory Visit: Payer: Self-pay

## 2021-01-04 DIAGNOSIS — R350 Frequency of micturition: Secondary | ICD-10-CM | POA: Diagnosis not present

## 2021-01-04 NOTE — Progress Notes (Signed)
PTNS  Session # 3 of 45  Health & Social Factors: diabetes and fluid pills Caffeine: 1 cup Alcohol: none Daytime voids #per day: 6 Night-time voids #per night: 4-5 Urgency: yes Incontinence Episodes #per day: half the time Ankle used: left Treatment Setting: 7 Feeling/ Response: positive sensation Comments: n/a  Performed By: n/a  Follow Up: keep next scheduled NV

## 2021-01-09 NOTE — Progress Notes (Signed)
PTNS  Session # 4 of 45  Health & Social Factors: diabetic & fluid pills Caffeine: 1 cup Alcohol: none Daytime voids #per day: 5-6 Night-time voids #per night: 5 + Urgency: yes Incontinence Episodes #per day: 1/2 the time Ankle used: left Treatment Setting: 2 Feeling/ Response: positive sensation Comments: n/a  Performed By: [facility] LPN  Follow Up: keep next scheduled NV

## 2021-01-11 ENCOUNTER — Other Ambulatory Visit: Payer: Self-pay

## 2021-01-11 ENCOUNTER — Ambulatory Visit (INDEPENDENT_AMBULATORY_CARE_PROVIDER_SITE_OTHER): Payer: Medicare Other

## 2021-01-11 DIAGNOSIS — R350 Frequency of micturition: Secondary | ICD-10-CM | POA: Diagnosis not present

## 2021-01-12 ENCOUNTER — Other Ambulatory Visit: Payer: Medicare Other

## 2021-01-18 ENCOUNTER — Ambulatory Visit: Payer: Medicare Other

## 2021-01-18 ENCOUNTER — Other Ambulatory Visit: Payer: Self-pay

## 2021-01-18 ENCOUNTER — Ambulatory Visit
Admission: RE | Admit: 2021-01-18 | Discharge: 2021-01-18 | Disposition: A | Discharge status: Home / Self Care | Payer: Medicare Other | Source: Ambulatory Visit | Attending: Internal Medicine | Admitting: Internal Medicine

## 2021-01-18 DIAGNOSIS — R634 Abnormal weight loss: Secondary | ICD-10-CM

## 2021-01-18 MED ORDER — IOPAMIDOL (ISOVUE-300) INJECTION 61%
100.0000 mL | Freq: Once | INTRAVENOUS | Status: AC | PRN
  Administered 2021-01-18: 100 mL via INTRAVENOUS

## 2021-01-19 ENCOUNTER — Ambulatory Visit (INDEPENDENT_AMBULATORY_CARE_PROVIDER_SITE_OTHER): Payer: Medicare Other

## 2021-01-19 DIAGNOSIS — R350 Frequency of micturition: Secondary | ICD-10-CM

## 2021-01-19 NOTE — Progress Notes (Signed)
PTNS  Session # 5 of 45  Health & Social Factors: takes fluid pill Caffeine   Alcohol: 1 cup Daytime voids #per day: none Night-time voids #per night: 4-5 Urgency: 3-4 Incontinence Episodes #per day: yes Ankle used: left Treatment Setting: 19 Feeling/ Response: ankle middle foot tingling Comments: none  Performed By: Estill Bamberg RN  Follow Up: 1 week PTNS

## 2021-01-24 NOTE — Progress Notes (Deleted)
PTNS  Session # 6 of 45  Health & Social Factors: *** Caffeine: *** Alcohol: *** Daytime voids #per day: *** Night-time voids #per night: *** Urgency: *** Incontinence Episodes #per day: *** Ankle used: *** Treatment Setting: *** Feeling/ Response: *** Comments: ***  Performed By: ***  Follow Up: ***

## 2021-01-25 ENCOUNTER — Ambulatory Visit (INDEPENDENT_AMBULATORY_CARE_PROVIDER_SITE_OTHER): Payer: Medicare Other

## 2021-01-25 ENCOUNTER — Other Ambulatory Visit: Payer: Self-pay

## 2021-01-25 DIAGNOSIS — R350 Frequency of micturition: Secondary | ICD-10-CM

## 2021-01-25 NOTE — Progress Notes (Signed)
PTNS  Session # 6 of 45  Health & Social Factors: Diabetes Caffeine: 1 cup Alcohol: none Daytime voids #per day: 4-5 Night-time voids #per night: 3-4 Urgency: yes Incontinence Episodes #per day: half of time Ankle used: left Treatment Setting: 12 Feeling/ Response: heel throbbing Comments: none  Performed By: Estill Bamberg RN  Follow Up: weekly PTNS

## 2021-01-28 ENCOUNTER — Other Ambulatory Visit: Payer: Medicare Other

## 2021-01-31 ENCOUNTER — Telehealth: Payer: Self-pay

## 2021-02-01 ENCOUNTER — Ambulatory Visit (INDEPENDENT_AMBULATORY_CARE_PROVIDER_SITE_OTHER): Payer: Medicare Other

## 2021-02-01 ENCOUNTER — Other Ambulatory Visit: Payer: Self-pay

## 2021-02-01 DIAGNOSIS — R350 Frequency of micturition: Secondary | ICD-10-CM

## 2021-02-01 NOTE — Progress Notes (Signed)
PTNS  Session # 7 of 45  Health & Social Factors: diabetic & fluid pills Caffeine: 1 cup Alcohol: none Daytime voids #per day: 4-5 Night-time voids #per night: 3-4 Urgency: yes Incontinence Episodes #per day: half the time Ankle used: left Treatment Setting: 2 Feeling/ Response: positive sensation  Comments: n/a  Performed By: [facility] LPN  Follow Up: Keep schedule NV

## 2021-02-08 ENCOUNTER — Other Ambulatory Visit: Payer: Self-pay

## 2021-02-08 ENCOUNTER — Ambulatory Visit (INDEPENDENT_AMBULATORY_CARE_PROVIDER_SITE_OTHER): Payer: Medicare Other

## 2021-02-08 DIAGNOSIS — R350 Frequency of micturition: Secondary | ICD-10-CM | POA: Diagnosis not present

## 2021-02-08 NOTE — Progress Notes (Signed)
PTNS  Session # 8 of 45  Health & Social Factors: diabetic/fluid pills Caffeine: 1 cup Alcohol: none Daytime voids #per day: 5-6 Night-time voids #per night: 2-3 Urgency: yes Incontinence Episodes #per day: half the time Ankle used: right Treatment Setting: 2 Feeling/ Response: positive sensation  Comments: n/a  Performed By: [facility] LPN  Follow Up: Keep scheduled NV

## 2021-02-15 ENCOUNTER — Other Ambulatory Visit: Payer: Self-pay

## 2021-02-15 ENCOUNTER — Ambulatory Visit (INDEPENDENT_AMBULATORY_CARE_PROVIDER_SITE_OTHER): Payer: Medicare Other

## 2021-02-15 DIAGNOSIS — R350 Frequency of micturition: Secondary | ICD-10-CM

## 2021-02-15 NOTE — Progress Notes (Signed)
PTNS  Session # 9 of 45  Health & Social Factors: diabetic, fluid pills Caffeine: 1 cup Alcohol: none Daytime voids #per day: 5-6 Night-time voids #per night: 2-3 Urgency: yes Incontinence Episodes #per day: half the time Ankle used: right Treatment Setting: 3 Feeling/ Response: positive sensation Comments: n/a  Performed By: [facility] LPN  Follow Up: Keep next scheduled NV

## 2021-02-22 ENCOUNTER — Ambulatory Visit (INDEPENDENT_AMBULATORY_CARE_PROVIDER_SITE_OTHER): Payer: Medicare Other

## 2021-02-22 ENCOUNTER — Other Ambulatory Visit: Payer: Self-pay

## 2021-02-22 DIAGNOSIS — R350 Frequency of micturition: Secondary | ICD-10-CM | POA: Diagnosis not present

## 2021-02-22 NOTE — Progress Notes (Signed)
PTNS  Session # 10 of 12 (45)  Health & Social Factors: diabetes/fluid pills Caffeine: 6 cups Alcohol: none Daytime voids #per day: 3 Night-time voids #per night: 2-3 Urgency: yes Incontinence Episodes #per day: some Ankle used: right Treatment Setting: 3 Feeling/ Response: toe throbbing Comments:   Performed ByAmanda RN  Follow Up: weekly PTNS

## 2021-03-01 ENCOUNTER — Ambulatory Visit: Payer: Medicare Other

## 2021-03-02 ENCOUNTER — Ambulatory Visit (INDEPENDENT_AMBULATORY_CARE_PROVIDER_SITE_OTHER): Payer: Medicare Other

## 2021-03-02 ENCOUNTER — Other Ambulatory Visit: Payer: Self-pay

## 2021-03-02 DIAGNOSIS — R350 Frequency of micturition: Secondary | ICD-10-CM | POA: Diagnosis not present

## 2021-03-02 NOTE — Progress Notes (Signed)
PTNS  Session # 11 of 45  Health & Social Factors: diabetes/fluid pills Caffeine: 6 cups Alcohol: none Daytime voids #per day: 3 Night-time voids #per night: 2-3 Urgency: yes Incontinence Episodes #per day: some Ankle used: right Treatment Setting: 3 Feeling/ Response: toe throbbing Comments:    Performed ByAmanda RN   Follow Up: weekly PTNS

## 2021-03-08 ENCOUNTER — Ambulatory Visit (INDEPENDENT_AMBULATORY_CARE_PROVIDER_SITE_OTHER): Payer: Medicare Other

## 2021-03-08 ENCOUNTER — Other Ambulatory Visit: Payer: Self-pay

## 2021-03-08 DIAGNOSIS — R350 Frequency of micturition: Secondary | ICD-10-CM

## 2021-03-08 NOTE — Progress Notes (Signed)
PTNS  Session # 12 of 45  Health & Social Factors: None Caffeine: 6 Alcohol: none Daytime voids #per day: 3 Night-time voids #per night: 2-3 Urgency: Yes Incontinence Episodes #per day: few Ankle used: right Treatment Setting: 2 Feeling/ Response: tingling in toe Comments:   Performed By: Janett Billow CMA  Follow Up: Weekly PTNS

## 2021-03-24 ENCOUNTER — Other Ambulatory Visit: Payer: Self-pay

## 2021-03-24 ENCOUNTER — Ambulatory Visit: Payer: Medicare Other | Admitting: Urology

## 2021-03-24 ENCOUNTER — Encounter: Payer: Self-pay | Admitting: Urology

## 2021-03-24 VITALS — BP 150/68 | HR 64 | Wt 165.0 lb

## 2021-03-24 DIAGNOSIS — R9721 Rising PSA following treatment for malignant neoplasm of prostate: Secondary | ICD-10-CM

## 2021-03-24 DIAGNOSIS — R3915 Urgency of urination: Secondary | ICD-10-CM | POA: Diagnosis not present

## 2021-03-24 DIAGNOSIS — R350 Frequency of micturition: Secondary | ICD-10-CM

## 2021-03-24 DIAGNOSIS — Z8546 Personal history of malignant neoplasm of prostate: Secondary | ICD-10-CM

## 2021-03-24 DIAGNOSIS — N3040 Irradiation cystitis without hematuria: Secondary | ICD-10-CM | POA: Diagnosis not present

## 2021-03-24 LAB — URINALYSIS, ROUTINE W REFLEX MICROSCOPIC
Bilirubin, UA: NEGATIVE
Glucose, UA: NEGATIVE
Ketones, UA: NEGATIVE
Leukocytes,UA: NEGATIVE
Nitrite, UA: NEGATIVE
Protein,UA: NEGATIVE
RBC, UA: NEGATIVE
Specific Gravity, UA: 1.01 (ref 1.005–1.030)
Urobilinogen, Ur: 0.2 mg/dL (ref 0.2–1.0)
pH, UA: 7 (ref 5.0–7.5)

## 2021-03-24 LAB — BLADDER SCAN AMB NON-IMAGING: Scan Result: 23

## 2021-03-24 NOTE — Progress Notes (Signed)
Subjective:  1. Urinary frequency   2. Urgency of urination   3. Radiation cystitis   4. Personal history of prostate cancer   5. Rising PSA following treatment for malignant neoplasm of prostate      Robert Mcknight has a history of a seed implant for T1c Gleason 6 low risk prostate cancer on 01/05/10. His PSA was 2.2 prior to this visit.  It was 1.9 3/22 but was  2.1 a year ago and 1.7 in 9/20.  His nadir was 0.48 in 12/14.  A PSMA PET on 01/14/20 was negative.  His testosterone was 376 on 01/13/20.  He has also had a prior TURP in 2009. He had hematuria in 4/13 and was found to have radiation changes in the prostate on cystoscopy.  He had further hematuria in 9/20 with repeat cystoscopy on 10/07/18 demonstrating radiation changes again. He has had no further hematuria.  He has no nocturia x 2-3. He still has some urgency and UUI only if he delays voiding a long time. His IPSS is 19.  He tried oxybutynin for a month but it didn't help.  He was given Gemtesa at his last visit and it didn't help.    He has lost about 13 lbs over the last 8 months ago but has no other associated signs or symptoms.     03/24/21: Robert Mcknight returns today in f/u.  He has completed 12 PTNS treatment but has had no improvement in his OAB symptoms.  He continues to have urgency with UUI.   His UA is clear.  His IPSS is 11 with nocturia x 3 and severe urgency.   He has failed Gemtesa and Oxybutynin without improvement.  His PVR is 38ml.   He has had no hematuria.  He has some recent diarrhea but no issues with constipation.   He is using depends.       His UA is clear today.   IPSS     Row Name 03/24/21 1100         International Prostate Symptom Score   How often have you had the sensation of not emptying your bladder? Not at All     How often have you had to urinate less than every two hours? Less than half the time     How often have you found you stopped and started again several times when you urinated? Less than half  the time     How often have you found it difficult to postpone urination? More than half the time     How often have you had a weak urinary stream? Not at All     How often have you had to strain to start urination? Not at All     How many times did you typically get up at night to urinate? 3 Times     Total IPSS Score 11       Quality of Life due to urinary symptoms   If you were to spend the rest of your life with your urinary condition just the way it is now how would you feel about that? Mixed               ROS:  ROS:  A complete review of systems was performed.  All systems are negative except for pertinent findings as noted.   Review of Systems  Genitourinary:  Positive for frequency and urgency.  All other systems reviewed and are negative.  Allergies  Allergen Reactions  Pollen Extract Other (See Comments)    Runny nose, congestion, cough    Outpatient Encounter Medications as of 03/24/2021  Medication Sig   amLODipine (NORVASC) 10 MG tablet TAKE 1 TABLET EVERY DAY   ascorbic acid (VITAMIN C) 500 MG tablet Take 500 mg by mouth daily.   aspirin EC 81 MG EC tablet Take 1 tablet (81 mg total) by mouth daily.   atorvastatin (LIPITOR) 40 MG tablet TAKE 1 TABLET BY MOUTH EVERY DAY AT 6PM   Bepotastine Besilate 1.5 % SOLN Place 1 drop into both eyes as needed.   carvedilol (COREG) 25 MG tablet Take 25 mg by mouth 2 (two) times daily.   CLINPRO 5000 1.1 % PSTE See admin instructions.   ferrous sulfate 325 (65 FE) MG tablet Take 1 tablet (325 mg total) by mouth 2 (two) times daily with a meal.   hydrochlorothiazide (HYDRODIURIL) 25 MG tablet Take 1 tablet (25 mg total) by mouth daily. Please call [phone] to schedule for additional refill.   metFORMIN (GLUMETZA) 500 MG (MOD) 24 hr tablet Take 500 mg by mouth 2 (two) times daily with a meal.   Omega-3 Fatty Acids (FISH OIL) 1000 MG CPDR Take 1,200 mg by mouth 2 (two) times daily.   omeprazole (PRILOSEC) 20 MG capsule  Take 20 mg by mouth 2 (two) times daily before a meal.   vitamin E 1000 UNIT capsule Take by mouth.   [DISCONTINUED] Calcium Carb-Cholecalciferol (CALCIUM CARBONATE-VITAMIN D3 PO) Take 1 tablet by mouth 2 (two) times daily. Calcium 600 mg and Vitamin  D 800 units   [DISCONTINUED] losartan (COZAAR) 100 MG tablet Take 1 tablet (100 mg total) by mouth daily. NEED OV.   Facility-Administered Encounter Medications as of 03/24/2021  Medication   0.9 %  sodium chloride infusion    Past Medical History:  Diagnosis Date   Arthritis    Cataract    Mixed form OU   Colon polyps    Dental infection 10/2016   Diabetes mellitus    GERD (gastroesophageal reflux disease)    Heart murmur    Hyperlipemia    Hypertension    Hypertensive retinopathy    OU   Macular degeneration    Dry OU   Prostate cancer ([place]) 2011   Seasonal allergies    Sleep apnea     Past Surgical History:  Procedure Laterality Date   CARDIAC CATHETERIZATION N/A 02/26/2015   Procedure: Left Heart Cath and Coronary Angiography;  Surgeon: Belva Crome, MD;  Location: [place] [facility];  Service: Cardiovascular;  Laterality: N/A;   ELECTROPHYSIOLOGIC STUDY N/A 05/03/2015   Procedure: SVT Ablation;  Surgeon: Will Meredith Leeds, MD;  Location: [place] [facility];  Service: Cardiovascular;  Laterality: N/A;   FLEXIBLE SIGMOIDOSCOPY N/A 03/23/2013   Procedure: FLEXIBLE SIGMOIDOSCOPY;  Surgeon: Beryle Beams, MD;  Location: [facility];  Service: Endoscopy;  Laterality: N/A;   INGUINAL HERNIA REPAIR  1992   left   MOUTH SURGERY Left 12/22/2016   Top   NASAL SINUS SURGERY  02/01/15   TRANSURETHRAL RESECTION OF PROSTATE     seeds implanted    Social History   Socioeconomic History   Marital status: Married    Spouse name: Not on file   Number of children: Not on file   Years of education: Not on file   Highest education level: Not on file  Occupational History   Not on file  Tobacco Use   Smoking status: Never  Smokeless tobacco: Never  Vaping Use   Vaping Use: Never used  Substance and Sexual Activity   Alcohol use: No   Drug use: No   Sexual activity: Never  Other Topics Concern   Not on file  Social History Narrative   Not on file   Social Determinants of Health   Financial Resource Strain: Not on file  Food Insecurity: Not on file  Transportation Needs: Not on file  Physical Activity: Not on file  Stress: Not on file  Social Connections: Not on file  Intimate Partner Violence: Not on file    Family History  Problem Relation Age of Onset   Colon cancer Brother 59   Colon cancer Brother    Stomach cancer Neg Hx        Objective: Vitals:   03/24/21 1055  BP: (!) 150/68  Pulse: 64     Physical Exam  Lab Results:  Results for orders placed or performed in visit on 03/24/21 (from the past 24 hour(s))  Urinalysis, Routine w reflex microscopic     Status: None   Collection Time: 03/24/21 11:32 AM  Result Value Ref Range   Specific Gravity, UA 1.010 1.005 - 1.030   pH, UA 7.0 5.0 - 7.5   Color, UA Yellow Yellow   Appearance Ur Clear Clear   Leukocytes,UA Negative Negative   Protein,UA Negative Negative/Trace   Glucose, UA Negative Negative   Ketones, UA Negative Negative   RBC, UA Negative Negative   Bilirubin, UA Negative Negative   Urobilinogen, Ur 0.2 0.2 - 1.0 mg/dL   Nitrite, UA Negative Negative   Microscopic Examination Comment    Narrative   Performed at:  [facility] [address] [place], [place]  [id number] Lab Director: Mina Marble MT, Phone:  [phone]     BMET No results for input(s): NA, K, CL, CO2, GLUCOSE, BUN, CREATININE, CALCIUM in the last 72 hours. PSA PSA  Date Value Ref Range Status  06/24/2019 1.5 < OR = 4.0 ng/mL Final    Comment:    The total PSA value from this assay system is  standardized against the WHO standard. The test  result will be approximately 20% lower when compared  to the  equimolar-standardized total PSA (Beckman  Coulter). Comparison of serial PSA results should be  interpreted with this fact in mind. . This test was performed using the Siemens  chemiluminescent method. Values obtained from  different assay methods cannot be used interchangeably. PSA levels, regardless of value, should not be interpreted as absolute evidence of the presence or absence of disease.   03/01/2015 0.89 0.00 - 4.00 ng/mL Final    Comment:    (NOTE) While PSA levels of <=4.0 ng/ml are reported as reference range, some men with levels below 4.0 ng/ml can have prostate cancer and many men with PSA above 4.0 ng/ml do not have prostate cancer.  Other tests such as free PSA, age specific reference ranges, PSA velocity and PSA doubling time may be helpful especially in men less than 8 years old.    Testosterone  Date Value Ref Range Status  12/25/2019 376 264 - 916 ng/dL Final    Comment:    Adult male reference interval is based on a population of healthy nonobese males (BMI <30) between 8 and 73 years old. [place], [place] [phone]. PMID: [id number].    Lab Results  Component Value Date   PSA1 2.2 12/02/2020   PSA1 2.2 09/23/2020  PSA1 1.9 03/30/2020     UA is clear.  Studies/Results:     Assessment & Plan: History of prostate cancer with slowly rising PSA s/p seeds.  He will keep his scheduled f/u in May.   Urgency with UUI.  He has stable LUTS that hasn't responded to oxybutynin, gemtesa or PTNS.  He is not interested in Botox or further medical therapy.   No orders of the defined types were placed in this encounter.     Orders Placed This Encounter  Procedures   Urinalysis, Routine w reflex microscopic   BLADDER SCAN AMB NON-IMAGING      Return for Keep f/u in May with PSA. .   CC: Donnajean Lopes, MD      Irine Seal 03/24/2021 Patient ID: Robert Mcknight, male   DOB: 1933-02-03, 88 y.o.   MRN: [id number]

## 2021-05-26 ENCOUNTER — Other Ambulatory Visit: Payer: Medicare Other

## 2021-05-26 DIAGNOSIS — R9721 Rising PSA following treatment for malignant neoplasm of prostate: Secondary | ICD-10-CM

## 2021-05-27 LAB — PSA: Prostate Specific Ag, Serum: 2.2 ng/mL (ref 0.0–4.0)

## 2021-06-02 ENCOUNTER — Ambulatory Visit (INDEPENDENT_AMBULATORY_CARE_PROVIDER_SITE_OTHER): Payer: Medicare Other | Admitting: Urology

## 2021-06-02 ENCOUNTER — Encounter: Payer: Self-pay | Admitting: Urology

## 2021-06-02 VITALS — BP 160/72 | HR 71

## 2021-06-02 DIAGNOSIS — R350 Frequency of micturition: Secondary | ICD-10-CM | POA: Diagnosis not present

## 2021-06-02 DIAGNOSIS — Z8546 Personal history of malignant neoplasm of prostate: Secondary | ICD-10-CM | POA: Diagnosis not present

## 2021-06-02 DIAGNOSIS — N3040 Irradiation cystitis without hematuria: Secondary | ICD-10-CM | POA: Diagnosis not present

## 2021-06-02 DIAGNOSIS — R3915 Urgency of urination: Secondary | ICD-10-CM

## 2021-06-02 DIAGNOSIS — R9721 Rising PSA following treatment for malignant neoplasm of prostate: Secondary | ICD-10-CM

## 2021-06-02 DIAGNOSIS — N3941 Urge incontinence: Secondary | ICD-10-CM

## 2021-06-02 LAB — URINALYSIS, ROUTINE W REFLEX MICROSCOPIC
Bilirubin, UA: NEGATIVE
Glucose, UA: NEGATIVE
Leukocytes,UA: NEGATIVE
Nitrite, UA: NEGATIVE
RBC, UA: NEGATIVE
Specific Gravity, UA: 1.025 (ref 1.005–1.030)
Urobilinogen, Ur: 0.2 mg/dL (ref 0.2–1.0)
pH, UA: 5.5 (ref 5.0–7.5)

## 2021-06-02 NOTE — Progress Notes (Signed)
?Subjective: ? ?1. Personal history of prostate cancer   ?2. Rising PSA following treatment for malignant neoplasm of prostate   ?3. Radiation cystitis   ?4. Urinary frequency   ?5. Urgency of urination   ?6. Urge incontinence   ?  ? ?[name] has a history of a seed implant for T1c Gleason 6 low risk prostate cancer on 01/05/10. His PSA was 2.2 prior to this visit.  It was 1.9 3/22 but was  2.1 a year ago and 1.7 in 9/20.  His nadir was 0.48 in 12/14.  A PSMA PET on 01/14/20 was negative.  His testosterone was 376 on 01/13/20.  He has also had a prior TURP in 2009. He had hematuria in 4/13 and was found to have radiation changes in the prostate on cystoscopy.  He had further hematuria in 9/20 with repeat cystoscopy on 10/07/18 demonstrating radiation changes again. He has had no further hematuria.  He has no nocturia x 2-3. He still has some urgency and UUI only if he delays voiding a long time. His IPSS is 19.  He tried oxybutynin for a month but it didn't help.  He was given Gemtesa at his last visit and it didn't help.    He has lost about 13 lbs over the last 8 months ago but has no other associated signs or symptoms.    ? ?03/24/21: [name] returns today in f/u.  He has completed 12 PTNS treatment but has had no improvement in his OAB symptoms.  He continues to have urgency with UUI.   His UA is clear.  His IPSS is 11 with nocturia x 3 and severe urgency.   He has failed Gemtesa and Oxybutynin without improvement.  His PVR is 101m.   He has had no hematuria.  He has some recent diarrhea but no issues with constipation.   He is using depends.    ? ?06/02/21: JJaquayreturns today in f/u.  His PSA is stable at 2.2. He has persistent OAB with UUI and his IPSS is 16.  He is wearing 2-3 pads daily.  He has failed multiple meds and PTNS. He has had no hematuria.  His weight is stable.  He has no bone pain.  He has no GI complaints.   He had diverticulitis treated in March.  ? ? ? His UA is clear today. ? ? IPSS   ? ?  RShumwayName 06/02/21 0900  ?  ?  ?  ? International Prostate Symptom Score  ? How often have you had the sensation of not emptying your bladder? About half the time    ? How often have you had to urinate less than every two hours? About half the time    ? How often have you found you stopped and started again several times when you urinated? About half the time    ? How often have you found it difficult to postpone urination? More than half the time    ? How often have you had a weak urinary stream? Not at All    ? How often have you had to strain to start urination? Not at All    ? How many times did you typically get up at night to urinate? 3 Times    ? Total IPSS Score 16    ?  ? Quality of Life due to urinary symptoms  ? If you were to spend the rest of your life with your urinary condition just the  way it is now how would you feel about that? Mixed    ? ?  ?  ? ?  ? ? ? ?ROS: ? ?ROS:  ?A complete review of systems was performed.  All systems are negative except for pertinent findings as noted.  ? ?Review of Systems  ?Genitourinary:  Positive for frequency and urgency.  ?All other systems reviewed and are negative. ? ?Allergies  ?Allergen Reactions  ? Pollen Extract Other (See Comments)  ?  Runny nose, congestion, cough  ? ? ?Outpatient Encounter Medications as of 06/02/2021  ?Medication Sig  ? amLODipine (NORVASC) 10 MG tablet TAKE 1 TABLET EVERY DAY  ? ascorbic acid (VITAMIN C) 500 MG tablet Take 500 mg by mouth daily.  ? aspirin EC 81 MG EC tablet Take 1 tablet (81 mg total) by mouth daily.  ? atorvastatin (LIPITOR) 40 MG tablet TAKE 1 TABLET BY MOUTH EVERY DAY AT 6PM  ? Bepotastine Besilate 1.5 % SOLN Place 1 drop into both eyes as needed.  ? carvedilol (COREG) 25 MG tablet Take 25 mg by mouth 2 (two) times daily.  ? CLINPRO 5000 1.1 % PSTE See admin instructions.  ? ferrous sulfate 325 (65 FE) MG tablet Take 1 tablet (325 mg total) by mouth 2 (two) times daily with a meal.  ? hydrochlorothiazide (HYDRODIURIL)  25 MG tablet Take 1 tablet (25 mg total) by mouth daily. Please call [phone] to schedule for additional refill.  ? metFORMIN (GLUCOPHAGE-XR) 500 MG 24 hr tablet Take 1,000 mg by mouth daily.  ? metFORMIN (GLUMETZA) 500 MG (MOD) 24 hr tablet Take 500 mg by mouth 2 (two) times daily with a meal.  ? Omega-3 Fatty Acids (FISH OIL) 1000 MG CPDR Take 1,200 mg by mouth 2 (two) times daily.  ? omeprazole (PRILOSEC) 20 MG capsule Take 20 mg by mouth 2 (two) times daily before a meal.  ? vitamin E 1000 UNIT capsule Take by mouth.  ? ?Facility-Administered Encounter Medications as of 06/02/2021  ?Medication  ? 0.9 %  sodium chloride infusion  ? ? ?Past Medical History:  ?Diagnosis Date  ? Arthritis   ? Cataract   ? Mixed form OU  ? Colon polyps   ? Dental infection 10/2016  ? Diabetes mellitus   ? GERD (gastroesophageal reflux disease)   ? Heart murmur   ? Hyperlipemia   ? Hypertension   ? Hypertensive retinopathy   ? OU  ? Macular degeneration   ? Dry OU  ? Prostate cancer [facility]) 2011  ? Seasonal allergies   ? Sleep apnea   ? ? ?Past Surgical History:  ?Procedure Laterality Date  ? CARDIAC CATHETERIZATION N/A 02/26/2015  ? Procedure: Left Heart Cath and Coronary Angiography;  Surgeon: Belva Crome, MD;  Location: [place] [facility];  Service: Cardiovascular;  Laterality: N/A;  ? ELECTROPHYSIOLOGIC STUDY N/A 05/03/2015  ? Procedure: SVT Ablation;  Surgeon: Will Meredith Leeds, MD;  Location: [place] [facility];  Service: Cardiovascular;  Laterality: N/A;  ? FLEXIBLE SIGMOIDOSCOPY N/A 03/23/2013  ? Procedure: FLEXIBLE SIGMOIDOSCOPY;  Surgeon: Beryle Beams, MD;  Location: [facility];  Service: Endoscopy;  Laterality: N/A;  ? [place]  ? left  ? MOUTH SURGERY Left 12/22/2016  ? Top  ? NASAL SINUS SURGERY  02/01/15  ? TRANSURETHRAL RESECTION OF PROSTATE    ? seeds implanted  ? ? ?Social History  ? ?Socioeconomic History  ? Marital status: Married  ?  Spouse name: Not on  file  ? Number of children: Not on file   ? Years of education: Not on file  ? Highest education level: Not on file  ?Occupational History  ? Not on file  ?Tobacco Use  ? Smoking status: Never  ? Smokeless tobacco: Never  ?Vaping Use  ? Vaping Use: Never used  ?Substance and Sexual Activity  ? Alcohol use: No  ? Drug use: No  ? Sexual activity: Never  ?Other Topics Concern  ? Not on file  ?Social History Narrative  ? Not on file  ? ?Social Determinants of Health  ? ?Financial Resource Strain: Not on file  ?Food Insecurity: Not on file  ?Transportation Needs: Not on file  ?Physical Activity: Not on file  ?Stress: Not on file  ?Social Connections: Not on file  ?Intimate Partner Violence: Not on file  ? ? ?Family History  ?Problem Relation Age of Onset  ? Colon cancer Brother 43  ? Colon cancer Brother   ? Stomach cancer Neg Hx   ? ? ? ? ? ?Objective: ?Vitals:  ? 06/02/21 0940  ?BP: (!) 160/72  ?Pulse: 71  ? ? ? ?Physical Exam ? ?Lab Results:  ?Results for orders placed or performed in visit on 06/02/21 (from the past 24 hour(s))  ?Urinalysis, Routine w reflex microscopic     Status: Abnormal  ? Collection Time: 06/02/21 10:19 AM  ?Result Value Ref Range  ? Specific Gravity, UA 1.025 1.005 - 1.030  ? pH, UA 5.5 5.0 - 7.5  ? Color, UA Yellow Yellow  ? Appearance Ur Clear Clear  ? Leukocytes,UA Negative Negative  ? Protein,UA Trace (A) Negative/Trace  ? Glucose, UA Negative Negative  ? Ketones, UA Trace (A) Negative  ? RBC, UA Negative Negative  ? Bilirubin, UA Negative Negative  ? Urobilinogen, Ur 0.2 0.2 - 1.0 mg/dL  ? Nitrite, UA Negative Negative  ? Microscopic Examination Comment   ? Narrative  ? Performed at:  [facility] ?[address] [place], [place]  [id number] ?Lab Director: [place], Phone:  [phone]  ? ?UA is clear.  ?  ?BMET ?No results for input(s): NA, K, CL, CO2, GLUCOSE, BUN, CREATININE, CALCIUM in the last 72 hours. ?PSA ?PSA  ?Date Value Ref Range Status  ?06/24/2019 1.5 < OR = 4.0 ng/mL Final  ?  Comment:  ?  The  total PSA value from this assay system is  ?standardized against the WHO standard. The test  ?result will be approximately 20% lower when compared  ?to the equimolar-standardized total PSA (Beckman  ?C

## 2021-09-20 NOTE — Progress Notes (Unsigned)
Office Visit    Patient Name: Robert Mcknight Date of Encounter: 09/21/2021  PCP:  Donnajean Lopes, MD   [facility]  Cardiologist:  Robert Rouge, MD  Advanced Practice Provider:  No care team member to display Electrophysiologist:  None  HPI    Robert Mcknight is a 88 y.o. male with a past medical history significant for SVT in 2007, mild aortic stenosis (echocardiogram 01/22/2019 EF 55 to 60%, mild MR, mild AS with mean gradient 14, peak 20 and DVI 0.30), prostate cancer, bradycardia, diabetes mellitus, GERD, hypertension, hyperlipidemia, sleep apnea presents today for annual follow-up visit.  Today, he feels pretty good without any cardiac issues. He is having some shoulder issues and experiencing some numbness in his arms. He plans to discuss this with his primary and possibly see an orthopedic doctor. We discussed his AS and getting an updated echo. He has not had any dizziness/lightheadedness/syncope/near syncope. BP at home has been 540G-867Y systolic. He has not taken his medication this morning and that is why it is slightly elevated today. We are working on getting his lipis panel from his primary.  Reports no shortness of breath nor dyspnea on exertion. Reports no chest pain, pressure, or tightness. No edema, orthopnea, PND. Reports no palpitations.    Past Medical History    Past Medical History:  Diagnosis Date   Arthritis    Cataract    Mixed form OU   Colon polyps    Dental infection 10/2016   Diabetes mellitus    GERD (gastroesophageal reflux disease)    Heart murmur    Hyperlipemia    Hypertension    Hypertensive retinopathy    OU   Macular degeneration    Dry OU   Prostate cancer ([place]) 2011   Seasonal allergies    Sleep apnea    Past Surgical History:  Procedure Laterality Date   CARDIAC CATHETERIZATION N/A 02/26/2015   Procedure: Left Heart Cath and Coronary Angiography;  Surgeon: Belva Crome, MD;  Location: [facility];  Service: Cardiovascular;  Laterality: N/A;   ELECTROPHYSIOLOGIC STUDY N/A 05/03/2015   Procedure: SVT Ablation;  Surgeon: Will Meredith Leeds, MD;  Location: [facility];  Service: Cardiovascular;  Laterality: N/A;   FLEXIBLE SIGMOIDOSCOPY N/A 03/23/2013   Procedure: FLEXIBLE SIGMOIDOSCOPY;  Surgeon: Beryle Beams, MD;  Location: [facility];  Service: Endoscopy;  Laterality: N/A;   INGUINAL HERNIA REPAIR  1992   left   MOUTH SURGERY Left 12/22/2016   Top   NASAL SINUS SURGERY  02/01/15   TRANSURETHRAL RESECTION OF PROSTATE     seeds implanted    Allergies  Allergies  Allergen Reactions   Pollen Extract Other (See Comments)    Runny nose, congestion, cough    EKGs/Labs/Other Studies Reviewed:   The following studies were reviewed today:  Carotid ultrasound 11/19/2019  Summary:  Right Carotid: Velocities in the right ICA are consistent with a 1-39%  stenosis.   Left Carotid: There is no evidence of stenosis in the left ICA.   Vertebrals:  Bilateral vertebral arteries demonstrate antegrade flow.  Subclavians: Normal flow hemodynamics were seen in bilateral subclavian               arteries.   Echocardiogram 01/22/2019 IMPRESSIONS     1. Left ventricular ejection fraction, by visual estimation, is 55 to  60%. The left ventricle has low normal function. There is mildly increased  left ventricular hypertrophy.  2. Left ventricular diastolic parameters are consistent with Grade I  diastolic dysfunction (impaired relaxation).   3. The left ventricle has no regional wall motion abnormalities.   4. Global right ventricle has normal systolic function.The right  ventricular size is normal. No increase in right ventricular wall  thickness.   5. Left atrial size was normal.   6. Right atrial size was normal.   7. Severe aortic valve annular calcification.   8. The mitral valve is normal in structure. Mild mitral valve  regurgitation. No evidence of mitral  stenosis.   9. The tricuspid valve is normal in structure.  10. Aortic valve mean gradient measures 14.0 mmHg.  11. The aortic valve is normal in structure. Aortic valve regurgitation is  not visualized. Mild to moderate aortic valve stenosis.  12. There is severe thickening of the aortic valve.  13. The pulmonic valve was normal in structure. Pulmonic valve  regurgitation is not visualized.  14. Normal pulmonary artery systolic pressure.  15. The inferior vena cava is normal in size with greater than 50%  respiratory variability, suggesting right atrial pressure of 3 mmHg.   FINDINGS   Left Ventricle: Left ventricular ejection fraction, by visual estimation,  is 55 to 60%. The left ventricle has low normal function. The left  ventricle has no regional wall motion abnormalities. There is mildly  increased left ventricular hypertrophy.  Concentric left ventricular hypertrophy. Left ventricular diastolic  parameters are consistent with Grade I diastolic dysfunction (impaired  relaxation). Normal left atrial pressure.   EKG:  EKG is  ordered today.  The ekg ordered today demonstrates NSR, rate 71 bpm  Recent Labs: No results found for requested labs within last 365 days.  Recent Lipid Panel    Component Value Date/Time   CHOL 189 02/26/2015 0939   TRIG 61 02/26/2015 0939   HDL 65 02/26/2015 0939   CHOLHDL 2.9 02/26/2015 0939   VLDL 12 02/26/2015 0939   LDLCALC 112 (H) 02/26/2015 0939    Home Medications   Current Meds  Medication Sig   Apoaequorin (PREVAGEN PO) Take by mouth.   CALCIUM PO Take by mouth.   losartan (COZAAR) 25 MG tablet Take 25 mg by mouth daily.   Probiotic Product (PROBIOTIC PO) Take by mouth.   Current Facility-Administered Medications for the 09/21/21 encounter (Office Visit) with Elgie Collard, PA-C  Medication   0.9 %  sodium chloride infusion     Review of Systems      All other systems reviewed and are otherwise negative except as noted  above.  Physical Exam    VS:  BP (!) 142/78   Pulse 71   Ht 5' 8.5" (1.74 m)   Wt 160 lb 9.6 oz (72.8 kg)   BMI 24.06 kg/m  , BMI Body mass index is 24.06 kg/m.  Wt Readings from Last 3 Encounters:  09/21/21 160 lb 9.6 oz (72.8 kg)  03/24/21 165 lb (74.8 kg)  12/02/20 166 lb 12.8 oz (75.7 kg)     GEN: Well nourished, well developed, in no acute distress. HEENT: normal. Neck: Supple, no JVD, carotid bruits, or masses. Cardiac: RRR, no murmurs, rubs, or gallops. No clubbing, cyanosis, edema.  Radials/PT 2+ and equal bilaterally.  Respiratory:  Respirations regular and unlabored, clear to auscultation bilaterally. GI: Soft, nontender, nondistended. MS: No deformity or atrophy. Skin: Warm and dry, no rash. Neuro:  Strength and sensation are intact. Psych: Normal affect.  Assessment & Plan    Aortic stenosis -  echo today -last echo showed EF 55-60%. Grade I DD. Mild to moderate AS  SVT -no further issues  Sinus bradycardia -NSR rate 71 today -continue current medication  Hypertension -continue to monitor your BP at home -continue coreg 25 mg BID and HCTZ 25 mg daily -142/78 today but patient has not taken the medication  Hyperlipidemia -we are working on getting his lipid panel from PCP -continue Lipitor 40 mg daily  DM -blood glucose well controlled per the patient -continue current medication regimen  Prostate cancer -no recent issues -per primary     Disposition: Follow up 1 year with Robert Rouge, MD or APP.  Signed, Elgie Collard, PA-C 09/21/2021, 11:20 AM [place]

## 2021-09-21 ENCOUNTER — Telehealth: Payer: Self-pay | Admitting: *Deleted

## 2021-09-21 ENCOUNTER — Ambulatory Visit: Payer: Medicare Other | Attending: Physician Assistant | Admitting: Physician Assistant

## 2021-09-21 ENCOUNTER — Encounter: Payer: Self-pay | Admitting: Physician Assistant

## 2021-09-21 VITALS — BP 142/78 | HR 71 | Ht 68.5 in | Wt 160.6 lb

## 2021-09-21 DIAGNOSIS — I471 Supraventricular tachycardia, unspecified: Secondary | ICD-10-CM

## 2021-09-21 DIAGNOSIS — C61 Malignant neoplasm of prostate: Secondary | ICD-10-CM

## 2021-09-21 DIAGNOSIS — E785 Hyperlipidemia, unspecified: Secondary | ICD-10-CM | POA: Diagnosis not present

## 2021-09-21 DIAGNOSIS — I10 Essential (primary) hypertension: Secondary | ICD-10-CM

## 2021-09-21 DIAGNOSIS — I350 Nonrheumatic aortic (valve) stenosis: Secondary | ICD-10-CM

## 2021-09-21 DIAGNOSIS — E1122 Type 2 diabetes mellitus with diabetic chronic kidney disease: Secondary | ICD-10-CM

## 2021-09-21 DIAGNOSIS — N183 Chronic kidney disease, stage 3 unspecified: Secondary | ICD-10-CM

## 2021-09-21 NOTE — Patient Instructions (Signed)
Medication Instructions:  Your physician recommends that you continue on your current medications as directed. Please refer to the Current Medication list given to you today.  *If you need a refill on your cardiac medications before your next appointment, please call your pharmacy*   Lab Work: None If you have labs (blood work) drawn today and your tests are completely normal, you will receive your results only by: [place] (if you have MyChart) OR A paper copy in the mail If you have any lab test that is abnormal or we need to change your treatment, we will call you to review the results.   Testing/Procedures: Your physician has requested that you have an echocardiogram. Echocardiography is a painless test that uses sound waves to create images of your heart. It provides your doctor with information about the size and shape of your heart and how well your heart's chambers and valves are working. This procedure takes approximately one hour. There are no restrictions for this procedure.    Follow-Up: At [facility], you and your health needs are our priority.  As part of our continuing mission to provide you with exceptional heart care, we have created designated Provider Care Teams.  These Care Teams include your primary Cardiologist (physician) and Advanced Practice Providers (APPs -  Physician Assistants and Nurse Practitioners) who all work together to provide you with the care you need, when you need it.  Your next appointment:   1 year(s)  The format for your next appointment:   In Person  Provider:   Jenkins Rouge, MD    Important Information About Sugar

## 2021-09-21 NOTE — Telephone Encounter (Signed)
Left message for patient to call. We received fax from PCP with lab results but they did not include lipids and lfts which is what was requested. Robert Mcknight would like for the patient to come in to have these drawn in the next week or two.

## 2021-10-03 ENCOUNTER — Ambulatory Visit (HOSPITAL_COMMUNITY): Payer: Medicare Other | Attending: Physician Assistant

## 2021-10-03 DIAGNOSIS — I350 Nonrheumatic aortic (valve) stenosis: Secondary | ICD-10-CM | POA: Insufficient documentation

## 2021-10-03 LAB — ECHOCARDIOGRAM COMPLETE
AR max vel: 1.39 cm2
AV Area VTI: 1.44 cm2
AV Area mean vel: 1.38 cm2
AV Mean grad: 17 mmHg
AV Peak grad: 30.9 mmHg
Ao pk vel: 2.78 m/s
Area-P 1/2: 2 cm2
S' Lateral: 2.4 cm

## 2021-10-05 NOTE — Telephone Encounter (Signed)
Left msg for patient to return call.

## 2021-10-11 ENCOUNTER — Encounter: Payer: Self-pay | Admitting: Adult Health

## 2021-10-11 ENCOUNTER — Ambulatory Visit: Payer: Medicare Other | Admitting: Adult Health

## 2021-10-11 VITALS — BP 128/72 | HR 56 | Temp 98.2°F | Resp 16 | Ht 68.5 in | Wt 162.0 lb

## 2021-10-11 DIAGNOSIS — E1122 Type 2 diabetes mellitus with diabetic chronic kidney disease: Secondary | ICD-10-CM

## 2021-10-11 DIAGNOSIS — N183 Chronic kidney disease, stage 3 unspecified: Secondary | ICD-10-CM

## 2021-10-11 DIAGNOSIS — E785 Hyperlipidemia, unspecified: Secondary | ICD-10-CM

## 2021-10-11 DIAGNOSIS — D509 Iron deficiency anemia, unspecified: Secondary | ICD-10-CM

## 2021-10-11 DIAGNOSIS — K219 Gastro-esophageal reflux disease without esophagitis: Secondary | ICD-10-CM

## 2021-10-11 DIAGNOSIS — R1310 Dysphagia, unspecified: Secondary | ICD-10-CM

## 2021-10-11 DIAGNOSIS — Z0000 Encounter for general adult medical examination without abnormal findings: Secondary | ICD-10-CM

## 2021-10-11 DIAGNOSIS — I10 Essential (primary) hypertension: Secondary | ICD-10-CM

## 2021-10-11 DIAGNOSIS — F419 Anxiety disorder, unspecified: Secondary | ICD-10-CM

## 2021-10-11 DIAGNOSIS — R630 Anorexia: Secondary | ICD-10-CM

## 2021-10-11 MED ORDER — LORAZEPAM 0.5 MG PO TABS: 0.5000 mg | ORAL_TABLET | Freq: Two times a day (BID) | ORAL | 0 refills | Status: DC | PRN

## 2021-10-11 MED ORDER — MIRTAZAPINE 7.5 MG PO TABS: 7.5000 mg | ORAL_TABLET | Freq: Every day | ORAL | 3 refills | Status: DC

## 2021-10-11 NOTE — Patient Instructions (Signed)
Preventive Care 65 Years and Older, Male Preventive care refers to lifestyle choices and visits with your health care provider that can promote health and wellness. Preventive care visits are also called wellness exams. What can I expect for my preventive care visit? Counseling During your preventive care visit, your health care provider may ask about your: Medical history, including: Past medical problems. Family medical history. History of falls. Current health, including: Emotional well-being. Home life and relationship well-being. Sexual activity. Memory and ability to understand (cognition). Lifestyle, including: Alcohol, nicotine or tobacco, and drug use. Access to firearms. Diet, exercise, and sleep habits. Work and work environment. Sunscreen use. Safety issues such as seatbelt and bike helmet use. Physical exam Your health care provider will check your: Height and weight. These may be used to calculate your BMI (body mass index). BMI is a measurement that tells if you are at a healthy weight. Waist circumference. This measures the distance around your waistline. This measurement also tells if you are at a healthy weight and may help predict your risk of certain diseases, such as type 2 diabetes and high blood pressure. Heart rate and blood pressure. Body temperature. Skin for abnormal spots. What immunizations do I need?  Vaccines are usually given at various ages, according to a schedule. Your health care provider will recommend vaccines for you based on your age, medical history, and lifestyle or other factors, such as travel or where you work. What tests do I need? Screening Your health care provider may recommend screening tests for certain conditions. This may include: Lipid and cholesterol levels. Diabetes screening. This is done by checking your blood sugar (glucose) after you have not eaten for a while (fasting). Hepatitis C test. Hepatitis B test. HIV (human  immunodeficiency virus) test. STI (sexually transmitted infection) testing, if you are at risk. Lung cancer screening. Colorectal cancer screening. Prostate cancer screening. Abdominal aortic aneurysm (AAA) screening. You may need this if you are a current or former smoker. Talk with your health care provider about your test results, treatment options, and if necessary, the need for more tests. Follow these instructions at home: Eating and drinking  Eat a diet that includes fresh fruits and vegetables, whole grains, lean protein, and low-fat dairy products. Limit your intake of foods with high amounts of sugar, saturated fats, and salt. Take vitamin and mineral supplements as recommended by your health care provider. Do not drink alcohol if your health care provider tells you not to drink. If you drink alcohol: Limit how much you have to 0-2 drinks a day. Know how much alcohol is in your drink. In the [place] one drink equals one 12 oz bottle of beer (355 mL), one 5 oz glass of wine (148 mL), or one 1 oz glass of hard liquor (44 mL). Lifestyle Brush your teeth every morning and night with fluoride toothpaste. Floss one time each day. Exercise for at least 30 minutes 5 or more days each week. Do not use any products that contain nicotine or tobacco. These products include cigarettes, chewing tobacco, and vaping devices, such as e-cigarettes. If you need help quitting, ask your health care provider. Do not use drugs. If you are sexually active, practice safe sex. Use a condom or other form of protection to prevent STIs. Take aspirin only as told by your health care provider. Make sure that you understand how much to take and what form to take. Work with your health care provider to find out whether it is safe   and beneficial for you to take aspirin daily. Ask your health care provider if you need to take a cholesterol-lowering medicine (statin). Find healthy ways to manage stress, such  as: Meditation, yoga, or listening to music. Journaling. Talking to a trusted person. Spending time with friends and family. Safety Always wear your seat belt while driving or riding in a vehicle. Do not drive: If you have been drinking alcohol. Do not ride with someone who has been drinking. When you are tired or distracted. While texting. If you have been using any mind-altering substances or drugs. Wear a helmet and other protective equipment during sports activities. If you have firearms in your house, make sure you follow all gun safety procedures. Minimize exposure to UV radiation to reduce your risk of skin cancer. What's next? Visit your health care provider once a year for an annual wellness visit. Ask your health care provider how often you should have your eyes and teeth checked. Stay up to date on all vaccines. This information is not intended to replace advice given to you by your health care provider. Make sure you discuss any questions you have with your health care provider. Document Revised: 07/07/2020 Document Reviewed: 07/07/2020 [employer]vier Patient Education  2023 [employer]  

## 2021-10-11 NOTE — Progress Notes (Signed)
[facility] clinic  Provider:  Durenda Age DNP  Code Status:  Full Code  Goals of Care:     10/11/2021    2:10 PM  Advanced Directives  Does Patient Have a Medical Advance Directive? No  Would patient like information on creating a medical advance directive? No - Patient declined     Chief Complaint  Patient presents with  . Establish Care    Patient is here to establish care, urinary frequency, loss of appetite, unwanted weight loss     HPI: Patient is a 88 y.o. male seen today to establish care with [place]. He    Past Medical History:  Diagnosis Date  . Arthritis   . Cataract    Mixed form OU  . Colon polyps   . Dental infection 10/2016  . Diabetes mellitus   . GERD (gastroesophageal reflux disease)   . Heart murmur   . Hyperlipemia   . Hypertension   . Hypertensive retinopathy    OU  . Macular degeneration    Dry OU  . Prostate cancer ([place]) 2011  . Seasonal allergies   . Sleep apnea     Past Surgical History:  Procedure Laterality Date  . CARDIAC CATHETERIZATION N/A 02/26/2015   Procedure: Left Heart Cath and Coronary Angiography;  Surgeon: Belva Crome, MD;  Location: [facility];  Service: Cardiovascular;  Laterality: N/A;  . ELECTROPHYSIOLOGIC STUDY N/A 05/03/2015   Procedure: SVT Ablation;  Surgeon: Will Meredith Leeds, MD;  Location: [facility];  Service: Cardiovascular;  Laterality: N/A;  . FLEXIBLE SIGMOIDOSCOPY N/A 03/23/2013   Procedure: FLEXIBLE SIGMOIDOSCOPY;  Surgeon: Beryle Beams, MD;  Location: [facility];  Service: Endoscopy;  Laterality: N/A;  . INGUINAL HERNIA REPAIR  1992   left  . MOUTH SURGERY Left 12/22/2016   Top  . NASAL SINUS SURGERY  02/01/15  . TRANSURETHRAL RESECTION OF PROSTATE     seeds implanted    Allergies  Allergen Reactions  . Pollen Extract Other (See Comments)    Runny nose, congestion, cough    Outpatient Encounter Medications as of 10/11/2021  Medication Sig  . amLODipine (NORVASC) 10 MG  tablet TAKE 1 TABLET EVERY DAY  . Apoaequorin (PREVAGEN PO) Take by mouth.  [name] Kitchen ascorbic acid (VITAMIN C) 500 MG tablet Take 500 mg by mouth daily.  [name] Kitchen aspirin EC 81 MG EC tablet Take 1 tablet (81 mg total) by mouth daily.  [name] Kitchen atorvastatin (LIPITOR) 40 MG tablet TAKE 1 TABLET BY MOUTH EVERY DAY AT 6PM  . CALCIUM PO Take by mouth.  . carvedilol (COREG) 25 MG tablet Take 25 mg by mouth 2 (two) times daily.  . ferrous sulfate 325 (65 FE) MG tablet Take 1 tablet (325 mg total) by mouth 2 (two) times daily with a meal.  . hydrochlorothiazide (HYDRODIURIL) 25 MG tablet Take 1 tablet (25 mg total) by mouth daily. Please call [phone] to schedule for additional refill.  . losartan (COZAAR) 25 MG tablet Take 25 mg by mouth daily.  . metFORMIN (GLUMETZA) 500 MG (MOD) 24 hr tablet Take 500 mg by mouth 2 (two) times daily with a meal.  . Omega-3 Fatty Acids (FISH OIL) 1000 MG CPDR Take 1,200 mg by mouth 2 (two) times daily.  . pantoprazole (PROTONIX) 40 MG tablet Take 40 mg by mouth 2 (two) times daily.  . Probiotic Product (PROBIOTIC PO) Take by mouth.  . vitamin E 1000 UNIT capsule Take by mouth.   Facility-Administered Encounter Medications  as of 10/11/2021  Medication  . 0.9 %  sodium chloride infusion    Review of Systems:  Review of Systems  Constitutional:  Negative for activity change, appetite change and fever.  HENT:  Negative for sore throat.   Eyes: Negative.   Cardiovascular:  Negative for chest pain and leg swelling.  Gastrointestinal:  Negative for abdominal distention, diarrhea and vomiting.  Genitourinary:  Negative for dysuria, frequency and urgency.  Skin:  Negative for color change.  Neurological:  Negative for dizziness and headaches.  Psychiatric/Behavioral:  Negative for behavioral problems and sleep disturbance. The patient is not nervous/anxious.     Health Maintenance  Topic Date Due  . COVID-19 Vaccine (1) Never done  . FOOT EXAM  Never done  . OPHTHALMOLOGY EXAM   Never done  . Zoster Vaccines- Shingrix (1 of 2) Never done  . Pneumonia Vaccine 89+ Years old (1 - PCV) Never done  . HEMOGLOBIN A1C  05/04/2018  . TETANUS/TDAP  06/29/2021  . INFLUENZA VACCINE  08/23/2021  . COLONOSCOPY (Pts 45-83yr Insurance coverage will need to be confirmed)  01/05/2022  . HPV VACCINES  Aged Out    Physical Exam: Vitals:   10/11/21 1408  BP: 128/72  Pulse: (!) 56  Resp: 16  Temp: 98.2 F (36.8 C)  SpO2: 98%  Weight: 162 lb (73.5 kg)  Height: 5' 8.5" (1.74 m)   Body mass index is 24.27 kg/m. Physical Exam Constitutional:      Appearance: Normal appearance.  HENT:     Head: Normocephalic and atraumatic.     Mouth/Throat:     Mouth: Mucous membranes are moist.  Eyes:     Conjunctiva/sclera: Conjunctivae normal.  Cardiovascular:     Rate and Rhythm: Normal rate and regular rhythm.     Pulses: Normal pulses.     Heart sounds: Normal heart sounds.  Pulmonary:     Effort: Pulmonary effort is normal.     Breath sounds: Normal breath sounds.  Abdominal:     General: Bowel sounds are normal.     Palpations: Abdomen is soft.  Musculoskeletal:        General: No swelling. Normal range of motion.     Cervical back: Normal range of motion.  Skin:    General: Skin is warm and dry.  Neurological:     General: No focal deficit present.     Mental Status: He is alert and oriented to person, place, and time.  Psychiatric:        Mood and Affect: Mood normal.        Behavior: Behavior normal.        Thought Content: Thought content normal.        Judgment: Judgment normal.    Labs reviewed: Basic Metabolic Panel: No results for input(s): "NA", "K", "CL", "CO2", "GLUCOSE", "BUN", "CREATININE", "CALCIUM", "MG", "PHOS", "TSH" in the last 8760 hours. Liver Function Tests: No results for input(s): "AST", "ALT", "ALKPHOS", "BILITOT", "PROT", "ALBUMIN" in the last 8760 hours. No results for input(s): "LIPASE", "AMYLASE" in the last 8760 hours. No results  for input(s): "AMMONIA" in the last 8760 hours. CBC: No results for input(s): "WBC", "NEUTROABS", "HGB", "HCT", "MCV", "PLT" in the last 8760 hours. Lipid Panel: No results for input(s): "CHOL", "HDL", "LDLCALC", "TRIG", "CHOLHDL", "LDLDIRECT" in the last 8760 hours. Lab Results  Component Value Date   HGBA1C 6.6 (H) 02/25/2015    Procedures since last visit: ECHOCARDIOGRAM COMPLETE  Result Date: 10/03/2021    ECHOCARDIOGRAM REPORT  Patient Name:   ZEBASTIAN CARICO Date of Exam: 10/03/2021 Medical Rec #:  [id number]       Height:       68.5 in Accession #:    [id number]      Weight:       160.6 lb Date of Birth:  1933/04/29       BSA:          1.872 m Patient Age:    21 years        BP:           142/78 mmHg Patient Gender: M               HR:           60 bpm. Exam Location:  [facility] Procedure: 2D Echo, 3D Echo, Cardiac Doppler, Color Doppler and Strain Analysis Indications:    I35 Aortic stenosis  History:        Patient has prior history of Echocardiogram examinations, most                 recent 01/22/2019. Aortic Valve Disease, Arrythmias:Tachycardia,                 SVT and Bradycardia; Risk Factors:Hypertension, Dyslipidemia,                 Sleep Apnea and Diabetes. Anemia.  Sonographer:    Basilia Jumbo BS, RDCS Referring Phys: [phone] TESSA N CONTE  Sonographer Comments: Suboptimal parasternal window. IMPRESSIONS  1. Left ventricular ejection fraction, by estimation, is 55 to 60%. Left ventricular ejection fraction by 3D volume is 55 %. The left ventricle has normal function. The left ventricle has no regional wall motion abnormalities. Left ventricular diastolic  parameters are consistent with Grade I diastolic dysfunction (impaired relaxation). The average left ventricular global longitudinal strain is -22.0 %. The global longitudinal strain is normal.  2. Right ventricular systolic function is normal. The right ventricular size is normal.  3. The mitral valve is normal in structure. Trivial  mitral valve regurgitation. No evidence of mitral stenosis.  4. The aortic valve is calcified. There is severe calcifcation of the aortic valve. There is severe thickening of the aortic valve. Aortic valve regurgitation is not visualized. Moderate aortic valve stenosis. Aortic valve area, by VTI measures 1.44 cm. Aortic valve mean gradient measures 17.0 mmHg. Aortic valve Vmax measures 2.78 m/s. DI 0.3.  5. The inferior vena cava is normal in size with greater than 50% respiratory variability, suggesting right atrial pressure of 3 mmHg. Comparison(s): 01/22/19 EF 55-60%. Mild-moderate AS 12mHg mean PG, 252mg peak PG. FINDINGS  Left Ventricle: Left ventricular ejection fraction, by estimation, is 55 to 60%. Left ventricular ejection fraction by 3D volume is 55 %. The left ventricle has normal function. The left ventricle has no regional wall motion abnormalities. The average left ventricular global longitudinal strain is -22.0 %. The global longitudinal strain is normal. The left ventricular internal cavity size was normal in size. There is no left ventricular hypertrophy. Left ventricular diastolic parameters are consistent  with Grade I diastolic dysfunction (impaired relaxation). Right Ventricle: The right ventricular size is normal. No increase in right ventricular wall thickness. Right ventricular systolic function is normal. Left Atrium: Left atrial size was normal in size. Right Atrium: Right atrial size was normal in size. Pericardium: There is no evidence of pericardial effusion. Mitral Valve: The mitral valve is normal in structure. Trivial mitral valve regurgitation. No evidence of mitral valve  stenosis. Tricuspid Valve: The tricuspid valve is normal in structure. Tricuspid valve regurgitation is mild. Aortic Valve: DI 0.3. The aortic valve is calcified. There is severe calcifcation of the aortic valve. There is severe thickening of the aortic valve. Aortic valve regurgitation is not visualized.  Moderate aortic stenosis is present. Aortic valve mean gradient measures 17.0 mmHg. Aortic valve peak gradient measures 30.9 mmHg. Aortic valve area, by VTI measures 1.44 cm. Pulmonic Valve: The pulmonic valve was not well visualized. Pulmonic valve regurgitation is not visualized. Aorta: The aortic root and ascending aorta are structurally normal, with no evidence of dilitation. Venous: The inferior vena cava is normal in size with greater than 50% respiratory variability, suggesting right atrial pressure of 3 mmHg. IAS/Shunts: The atrial septum is grossly normal.  LEFT VENTRICLE PLAX 2D LVIDd:         4.50 cm         Diastology LVIDs:         2.40 cm         LV e' medial:    6.85 cm/s LV PW:         1.00 cm         LV E/e' medial:  6.2 LV IVS:        0.95 cm         LV e' lateral:   7.51 cm/s LVOT diam:     2.50 cm         LV E/e' lateral: 5.7 LV SV:         86 LV SV Index:   46              2D LVOT Area:     4.91 cm        Longitudinal                                Strain                                2D Strain GLS  -19.7 %                                (A2C):                                2D Strain GLS  -21.3 %                                (A3C):                                2D Strain GLS  -25.0 %                                (A4C):                                2D Strain GLS  -22.0 %  Avg:                                 3D Volume EF                                LV 3D EF:    Left                                             ventricul                                             ar                                             ejection                                             fraction                                             by 3D                                             volume is                                             55 %.                                 3D Volume EF:                                3D EF:        55 %                                LV EDV:        121 ml                                LV ESV:       54 ml                                LV SV:        67 ml RIGHT VENTRICLE  IVC RV Basal diam:  2.90 cm     IVC diam: 1.30 cm RV S prime:     11.60 cm/s TAPSE (M-mode): 2.4 cm LEFT ATRIUM             Index        RIGHT ATRIUM           Index LA diam:        3.60 cm 1.92 cm/m   RA Area:     10.70 cm LA Vol (A2C):   45.6 ml 24.36 ml/m  RA Volume:   23.60 ml  12.61 ml/m LA Vol (A4C):   18.3 ml 9.78 ml/m LA Biplane Vol: 28.8 ml 15.39 ml/m  AORTIC VALVE AV Area (Vmax):    1.39 cm AV Area (Vmean):   1.38 cm AV Area (VTI):     1.44 cm AV Vmax:           278.00 cm/s AV Vmean:          190.667 cm/s AV VTI:            0.596 m AV Peak Grad:      30.9 mmHg AV Mean Grad:      17.0 mmHg LVOT Vmax:         78.70 cm/s LVOT Vmean:        53.700 cm/s LVOT VTI:          0.175 m LVOT/AV VTI ratio: 0.29  AORTA Ao Root diam: 3.40 cm Ao Asc diam:  3.50 cm MITRAL VALVE               TRICUSPID VALVE                            TR Peak grad:   26.6 mmHg MV Decel Time: 380 msec    TR Vmax:        258.00 cm/s MV E velocity: 42.60 cm/s MV A velocity: 77.90 cm/s  SHUNTS MV E/A ratio:  0.55        Systemic VTI:  0.18 m                            Systemic Diam: 2.50 cm Gwyndolyn Kaufman MD Electronically signed by Gwyndolyn Kaufman MD Signature Date/Time: 10/03/2021/2:55:53 PM    Final     Assessment/Plan     Labs/tests ordered:  * No order type specified * Next appt:  Visit date not found +

## 2021-10-17 ENCOUNTER — Other Ambulatory Visit: Payer: Medicare Other

## 2021-10-17 ENCOUNTER — Other Ambulatory Visit: Payer: Self-pay

## 2021-10-17 ENCOUNTER — Telehealth: Payer: Self-pay

## 2021-10-17 DIAGNOSIS — E785 Hyperlipidemia, unspecified: Secondary | ICD-10-CM

## 2021-10-17 DIAGNOSIS — D509 Iron deficiency anemia, unspecified: Secondary | ICD-10-CM

## 2021-10-17 DIAGNOSIS — N183 Chronic kidney disease, stage 3 unspecified: Secondary | ICD-10-CM

## 2021-10-17 DIAGNOSIS — E1122 Type 2 diabetes mellitus with diabetic chronic kidney disease: Secondary | ICD-10-CM

## 2021-10-17 DIAGNOSIS — Z0000 Encounter for general adult medical examination without abnormal findings: Secondary | ICD-10-CM

## 2021-10-17 DIAGNOSIS — R350 Frequency of micturition: Secondary | ICD-10-CM

## 2021-10-17 DIAGNOSIS — I10 Essential (primary) hypertension: Secondary | ICD-10-CM

## 2021-10-17 NOTE — Telephone Encounter (Signed)
Patient's wife called stating that patient iforgot to mention that he is having lower abdominal pain and urinary frequency. Urinary frequency started last night and has lasted until the day.  Message routed to Durenda Age, NP

## 2021-10-17 NOTE — Telephone Encounter (Signed)
Called and left message on both home and cell phone number to call office to be placed on lab schedule.

## 2021-10-18 LAB — COMPLETE METABOLIC PANEL WITH GFR
AG Ratio: 1.8 (calc) (ref 1.0–2.5)
ALT: 15 U/L (ref 9–46)
AST: 15 U/L (ref 10–35)
Albumin: 3.9 g/dL (ref 3.6–5.1)
Alkaline phosphatase (APISO): 48 U/L (ref 35–144)
BUN: 16 mg/dL (ref 7–25)
CO2: 28 mmol/L (ref 20–32)
Calcium: 9.3 mg/dL (ref 8.6–10.3)
Chloride: 102 mmol/L (ref 98–110)
Creat: 1.18 mg/dL (ref 0.70–1.22)
Globulin: 2.2 g/dL (calc) (ref 1.9–3.7)
Glucose, Bld: 101 mg/dL — ABNORMAL HIGH (ref 65–99)
Potassium: 3.7 mmol/L (ref 3.5–5.3)
Sodium: 139 mmol/L (ref 135–146)
Total Bilirubin: 1.1 mg/dL (ref 0.2–1.2)
Total Protein: 6.1 g/dL (ref 6.1–8.1)
eGFR: 60 mL/min/{1.73_m2} (ref >=60)

## 2021-10-18 LAB — HEPATITIS B SURFACE ANTIBODY,QUALITATIVE: Hep B S Ab: NONREACTIVE

## 2021-10-18 LAB — LIPID PANEL
Cholesterol: 143 mg/dL (ref <200)
HDL: 65 mg/dL (ref >=40)
LDL Cholesterol (Calc): 61 mg/dL (calc)
Non-HDL Cholesterol (Calc): 78 mg/dL (calc) (ref <130)
Total CHOL/HDL Ratio: 2.2 (calc) (ref <5.0)
Triglycerides: 89 mg/dL (ref <150)

## 2021-10-18 LAB — CBC WITH DIFFERENTIAL/PLATELET
Absolute Monocytes: 585 cells/uL (ref 200–950)
Basophils Absolute: 59 cells/uL (ref 0–200)
Basophils Relative: 0.8 %
Eosinophils Absolute: 252 cells/uL (ref 15–500)
Eosinophils Relative: 3.4 %
HCT: 41.4 % (ref 38.5–50.0)
Hemoglobin: 13.8 g/dL (ref 13.2–17.1)
Lymphs Abs: 1813 cells/uL (ref 850–3900)
MCH: 30.5 pg (ref 27.0–33.0)
MCHC: 33.3 g/dL (ref 32.0–36.0)
MCV: 91.6 fL (ref 80.0–100.0)
MPV: 10.6 fL (ref 7.5–12.5)
Monocytes Relative: 7.9 %
Neutro Abs: 4692 cells/uL (ref 1500–7800)
Neutrophils Relative %: 63.4 %
Platelets: 169 10*3/uL (ref 140–400)
RBC: 4.52 10*6/uL (ref 4.20–5.80)
RDW: 13.1 % (ref 11.0–15.0)
Total Lymphocyte: 24.5 %
WBC: 7.4 10*3/uL (ref 3.8–10.8)

## 2021-10-18 LAB — HEPATITIS C ANTIBODY: Hepatitis C Ab: NONREACTIVE

## 2021-10-18 LAB — HEMOGLOBIN A1C
Hgb A1c MFr Bld: 6.1 % of total Hgb — ABNORMAL HIGH (ref <5.7)
Mean Plasma Glucose: 128 mg/dL
eAG (mmol/L): 7.1 mmol/L

## 2021-10-18 LAB — HIV ANTIBODY (ROUTINE TESTING W REFLEX): HIV 1&2 Ab, 4th Generation: NONREACTIVE

## 2021-10-20 NOTE — Progress Notes (Signed)
A1C 6.1, improved from 6.6 (taken 3 years ago)

## 2021-10-20 NOTE — Telephone Encounter (Signed)
This was Monina's response that was sent back to [facility] when she sent the original message:   Medina-Vargas, Monina C, NP  [facility] Clinical Pool 3 days ago    I have put in order for urine dipstick and culture. Pls ask if they can come to [facility] to give urine sample.     The message was sent in a way that does not make it visible to all (reason copied and pasted).  I called and left a detailed message for patient and his wife requesting a return call to schedule an appointment. I stressed in the voicemail, that the earlier the patients calls back, the greater the probability of him being seen today as we currently have access to care with several of our providers, including Monina. I also sent a mychart message as another means of communication.

## 2021-10-25 NOTE — Telephone Encounter (Signed)
Patient returned call. Explained to patient that Jaymes Graff is wanting a follow up appointment.   Patient stated that he will speak with his wife when she returns and schedule an appointment for when she can bring him. Stated that he will call back and schedule.

## 2021-10-27 ENCOUNTER — Ambulatory Visit (INDEPENDENT_AMBULATORY_CARE_PROVIDER_SITE_OTHER): Payer: Medicare Other | Admitting: Adult Health

## 2021-10-27 ENCOUNTER — Encounter: Payer: Self-pay | Admitting: Adult Health

## 2021-10-27 VITALS — BP 160/98 | HR 65 | Temp 97.1°F | Ht 68.5 in | Wt 160.0 lb

## 2021-10-27 DIAGNOSIS — R350 Frequency of micturition: Secondary | ICD-10-CM | POA: Diagnosis not present

## 2021-10-27 DIAGNOSIS — N183 Chronic kidney disease, stage 3 unspecified: Secondary | ICD-10-CM

## 2021-10-27 DIAGNOSIS — E1122 Type 2 diabetes mellitus with diabetic chronic kidney disease: Secondary | ICD-10-CM

## 2021-10-27 DIAGNOSIS — I10 Essential (primary) hypertension: Secondary | ICD-10-CM

## 2021-10-27 DIAGNOSIS — D509 Iron deficiency anemia, unspecified: Secondary | ICD-10-CM | POA: Diagnosis not present

## 2021-10-27 LAB — POCT URINALYSIS DIPSTICK
Bilirubin, UA: NEGATIVE
Glucose, UA: NEGATIVE
Ketones, UA: NEGATIVE
Nitrite, UA: NEGATIVE
Protein, UA: POSITIVE — AB
Spec Grav, UA: 1.015 (ref 1.010–1.025)
Urobilinogen, UA: 0.2 E.U./dL
pH, UA: 7 (ref 5.0–8.0)

## 2021-10-27 NOTE — Patient Instructions (Signed)

## 2021-10-27 NOTE — Progress Notes (Signed)
[facility] clinic  Provider:  Durenda Age DNP  Code Status:  Full Code  Goals of Care:     10/11/2021    2:10 PM  Advanced Directives  Does Patient Have a Medical Advance Directive? No  Would patient like information on creating a medical advance directive? No - Patient declined     Chief Complaint  Patient presents with   Acute Visit    Patient presents today to discuss his lab results and increased urination.    HPI: Patient is a 88 y.o. male seen today for an acute visit for lab result discussion. BP today was 160/98. He takes Amlodipine, Coreg, HCTZ and Lopsartan for hypertension. He said that he has white coat syndrome, high when he is in the clinic. At home, his average SBP is 130s. He denies dizziness.  Today, he complains of frequent urination Urine dipstick showed trace blood, trace protein and trace leukocyte.  Latest A1c  6.1, down from 6.6 (02/27/15). He takes Metformin for type 2 diabetes mellitus.   Lipid panel, CBC and CMP were all within normal limits.    Past Medical History:  Diagnosis Date   Arthritis    Cataract    Mixed form OU   Colon polyps    Dental infection 10/2016   Diabetes mellitus    GERD (gastroesophageal reflux disease)    Heart murmur    Hyperlipemia    Hypertension    Hypertensive retinopathy    OU   Macular degeneration    Dry OU   Prostate cancer ([place]) 2011   Seasonal allergies    Sleep apnea     Past Surgical History:  Procedure Laterality Date   CARDIAC CATHETERIZATION N/A 02/26/2015   Procedure: Left Heart Cath and Coronary Angiography;  Surgeon: Belva Crome, MD;  Location: [place] [facility];  Service: Cardiovascular;  Laterality: N/A;   ELECTROPHYSIOLOGIC STUDY N/A 05/03/2015   Procedure: SVT Ablation;  Surgeon: Will Meredith Leeds, MD;  Location: [place] [facility];  Service: Cardiovascular;  Laterality: N/A;   FLEXIBLE SIGMOIDOSCOPY N/A 03/23/2013   Procedure: FLEXIBLE SIGMOIDOSCOPY;  Surgeon: Beryle Beams, MD;   Location: [facility];  Service: Endoscopy;  Laterality: N/A;   INGUINAL HERNIA REPAIR  1992   left   MOUTH SURGERY Left 12/22/2016   Top   NASAL SINUS SURGERY  02/01/15   TRANSURETHRAL RESECTION OF PROSTATE     seeds implanted    Allergies  Allergen Reactions   Pollen Extract Other (See Comments)    Runny nose, congestion, cough    Outpatient Encounter Medications as of 10/27/2021  Medication Sig   amLODipine (NORVASC) 10 MG tablet TAKE 1 TABLET EVERY DAY   Apoaequorin (PREVAGEN PO) Take by mouth.   ascorbic acid (VITAMIN C) 500 MG tablet Take 500 mg by mouth daily.   aspirin EC 81 MG EC tablet Take 1 tablet (81 mg total) by mouth daily.   atorvastatin (LIPITOR) 40 MG tablet TAKE 1 TABLET BY MOUTH EVERY DAY AT 6PM   CALCIUM PO Take by mouth.   carvedilol (COREG) 25 MG tablet Take 25 mg by mouth 2 (two) times daily.   ferrous sulfate 325 (65 FE) MG tablet Take 1 tablet (325 mg total) by mouth 2 (two) times daily with a meal.   hydrochlorothiazide (HYDRODIURIL) 25 MG tablet Take 1 tablet (25 mg total) by mouth daily. Please call [phone] to schedule for additional refill.   LORazepam (ATIVAN) 0.5 MG tablet Take 1 tablet (0.5 mg  total) by mouth 2 (two) times daily as needed for anxiety.   losartan (COZAAR) 25 MG tablet Take 25 mg by mouth daily.   metFORMIN (GLUMETZA) 500 MG (MOD) 24 hr tablet Take 500 mg by mouth 2 (two) times daily with a meal.   mirtazapine (REMERON) 7.5 MG tablet Take 1 tablet (7.5 mg total) by mouth at bedtime.   Omega-3 Fatty Acids (FISH OIL) 1000 MG CPDR Take 1,200 mg by mouth 2 (two) times daily.   pantoprazole (PROTONIX) 40 MG tablet Take 40 mg by mouth 2 (two) times daily.   Probiotic Product (PROBIOTIC PO) Take by mouth.   vitamin E 1000 UNIT capsule Take by mouth.   Facility-Administered Encounter Medications as of 10/27/2021  Medication   0.9 %  sodium chloride infusion    Review of Systems:  Review of Systems  Constitutional:  Negative for  activity change, appetite change and fever.  HENT:  Negative for sore throat.   Eyes: Negative.   Cardiovascular:  Negative for chest pain and leg swelling.  Gastrointestinal:  Negative for abdominal distention, diarrhea and vomiting.  Genitourinary:  Positive for frequency. Negative for dysuria and urgency.  Skin:  Negative for color change.  Neurological:  Negative for dizziness and headaches.  Psychiatric/Behavioral:  Negative for behavioral problems and sleep disturbance. The patient is not nervous/anxious.     Health Maintenance  Topic Date Due   COVID-19 Vaccine (1) Never done   FOOT EXAM  Never done   OPHTHALMOLOGY EXAM  Never done   Zoster Vaccines- Shingrix (1 of 2) Never done   Pneumonia Vaccine 41+ Years old (1 - PCV) Never done   TETANUS/TDAP  06/29/2021   INFLUENZA VACCINE  08/23/2021   COLONOSCOPY (Pts 45-68yr Insurance coverage will need to be confirmed)  01/05/2022   HEMOGLOBIN A1C  04/17/2022   HPV VACCINES  Aged Out    Physical Exam: Vitals:   10/27/21 0926  BP: (!) 160/98  Pulse: 65  Temp: (!) 97.1 F (36.2 C)  SpO2: 98%  Weight: 160 lb (72.6 kg)  Height: 5' 8.5" (1.74 m)   Body mass index is 23.97 kg/m. Physical Exam Constitutional:      Appearance: Normal appearance.  HENT:     Head: Normocephalic and atraumatic.     Mouth/Throat:     Mouth: Mucous membranes are moist.  Eyes:     Conjunctiva/sclera: Conjunctivae normal.  Cardiovascular:     Rate and Rhythm: Normal rate and regular rhythm.     Pulses: Normal pulses.     Heart sounds: Normal heart sounds.  Pulmonary:     Effort: Pulmonary effort is normal.     Breath sounds: Normal breath sounds.  Abdominal:     General: Bowel sounds are normal.     Palpations: Abdomen is soft.  Musculoskeletal:        General: No swelling. Normal range of motion.     Cervical back: Normal range of motion.  Skin:    General: Skin is warm and dry.  Neurological:     General: No focal deficit present.      Mental Status: He is alert and oriented to person, place, and time.  Psychiatric:        Mood and Affect: Mood normal.        Behavior: Behavior normal.        Thought Content: Thought content normal.        Judgment: Judgment normal.     Labs reviewed: Basic Metabolic  Panel: Recent Labs    10/17/21 0808  NA 139  K 3.7  CL 102  CO2 28  GLUCOSE 101*  BUN 16  CREATININE 1.18  CALCIUM 9.3   Liver Function Tests: Recent Labs    10/17/21 0808  AST 15  ALT 15  BILITOT 1.1  PROT 6.1   No results for input(s): "LIPASE", "AMYLASE" in the last 8760 hours. No results for input(s): "AMMONIA" in the last 8760 hours. CBC: Recent Labs    10/17/21 0808  WBC 7.4  NEUTROABS 4,692  HGB 13.8  HCT 41.4  MCV 91.6  PLT 169   Lipid Panel: Recent Labs    10/17/21 0808  CHOL 143  HDL 65  LDLCALC 61  TRIG 89  CHOLHDL 2.2   Lab Results  Component Value Date   HGBA1C 6.1 (H) 10/17/2021    Procedures since last visit: ECHOCARDIOGRAM COMPLETE  Result Date: 10/03/2021    ECHOCARDIOGRAM REPORT   Patient Name:   Robert Mcknight Date of Exam: 10/03/2021 Medical Rec #:  [id number]       Height:       68.5 in Accession #:    [id number]      Weight:       160.6 lb Date of Birth:  1933-10-26       BSA:          1.872 m Patient Age:    41 years        BP:           142/78 mmHg Patient Gender: M               HR:           60 bpm. Exam Location:  [place] Procedure: 2D Echo, 3D Echo, Cardiac Doppler, Color Doppler and Strain Analysis Indications:    I35 Aortic stenosis  History:        Patient has prior history of Echocardiogram examinations, most                 recent 01/22/2019. Aortic Valve Disease, Arrythmias:Tachycardia,                 SVT and Bradycardia; Risk Factors:Hypertension, Dyslipidemia,                 Sleep Apnea and Diabetes. Anemia.  Sonographer:    Basilia Jumbo BS, RDCS Referring Phys: [phone] TESSA N CONTE  Sonographer Comments: Suboptimal parasternal window.  IMPRESSIONS  1. Left ventricular ejection fraction, by estimation, is 55 to 60%. Left ventricular ejection fraction by 3D volume is 55 %. The left ventricle has normal function. The left ventricle has no regional wall motion abnormalities. Left ventricular diastolic  parameters are consistent with Grade I diastolic dysfunction (impaired relaxation). The average left ventricular global longitudinal strain is -22.0 %. The global longitudinal strain is normal.  2. Right ventricular systolic function is normal. The right ventricular size is normal.  3. The mitral valve is normal in structure. Trivial mitral valve regurgitation. No evidence of mitral stenosis.  4. The aortic valve is calcified. There is severe calcifcation of the aortic valve. There is severe thickening of the aortic valve. Aortic valve regurgitation is not visualized. Moderate aortic valve stenosis. Aortic valve area, by VTI measures 1.44 cm. Aortic valve mean gradient measures 17.0 mmHg. Aortic valve Vmax measures 2.78 m/s. DI 0.3.  5. The inferior vena cava is normal in size with greater than 50% respiratory variability, suggesting right atrial pressure  of 3 mmHg. Comparison(s): 01/22/19 EF 55-60%. Mild-moderate AS 49mHg mean PG, 247mg peak PG. FINDINGS  Left Ventricle: Left ventricular ejection fraction, by estimation, is 55 to 60%. Left ventricular ejection fraction by 3D volume is 55 %. The left ventricle has normal function. The left ventricle has no regional wall motion abnormalities. The average left ventricular global longitudinal strain is -22.0 %. The global longitudinal strain is normal. The left ventricular internal cavity size was normal in size. There is no left ventricular hypertrophy. Left ventricular diastolic parameters are consistent  with Grade I diastolic dysfunction (impaired relaxation). Right Ventricle: The right ventricular size is normal. No increase in right ventricular wall thickness. Right ventricular systolic function  is normal. Left Atrium: Left atrial size was normal in size. Right Atrium: Right atrial size was normal in size. Pericardium: There is no evidence of pericardial effusion. Mitral Valve: The mitral valve is normal in structure. Trivial mitral valve regurgitation. No evidence of mitral valve stenosis. Tricuspid Valve: The tricuspid valve is normal in structure. Tricuspid valve regurgitation is mild. Aortic Valve: DI 0.3. The aortic valve is calcified. There is severe calcifcation of the aortic valve. There is severe thickening of the aortic valve. Aortic valve regurgitation is not visualized. Moderate aortic stenosis is present. Aortic valve mean gradient measures 17.0 mmHg. Aortic valve peak gradient measures 30.9 mmHg. Aortic valve area, by VTI measures 1.44 cm. Pulmonic Valve: The pulmonic valve was not well visualized. Pulmonic valve regurgitation is not visualized. Aorta: The aortic root and ascending aorta are structurally normal, with no evidence of dilitation. Venous: The inferior vena cava is normal in size with greater than 50% respiratory variability, suggesting right atrial pressure of 3 mmHg. IAS/Shunts: The atrial septum is grossly normal.  LEFT VENTRICLE PLAX 2D LVIDd:         4.50 cm         Diastology LVIDs:         2.40 cm         LV e' medial:    6.85 cm/s LV PW:         1.00 cm         LV E/e' medial:  6.2 LV IVS:        0.95 cm         LV e' lateral:   7.51 cm/s LVOT diam:     2.50 cm         LV E/e' lateral: 5.7 LV SV:         86 LV SV Index:   46              2D LVOT Area:     4.91 cm        Longitudinal                                Strain                                2D Strain GLS  -19.7 %                                (A2C):  2D Strain GLS  -21.3 %                                (A3C):                                2D Strain GLS  -25.0 %                                (A4C):                                2D Strain GLS  -22.0 %                                 Avg:                                 3D Volume EF                                LV 3D EF:    Left                                             ventricul                                             ar                                             ejection                                             fraction                                             by 3D                                             volume is                                             55 %.  3D Volume EF:                                3D EF:        55 %                                LV EDV:       121 ml                                LV ESV:       54 ml                                LV SV:        67 ml RIGHT VENTRICLE             IVC RV Basal diam:  2.90 cm     IVC diam: 1.30 cm RV S prime:     11.60 cm/s TAPSE (M-mode): 2.4 cm LEFT ATRIUM             Index        RIGHT ATRIUM           Index LA diam:        3.60 cm 1.92 cm/m   RA Area:     10.70 cm LA Vol (A2C):   45.6 ml 24.36 ml/m  RA Volume:   23.60 ml  12.61 ml/m LA Vol (A4C):   18.3 ml 9.78 ml/m LA Biplane Vol: 28.8 ml 15.39 ml/m  AORTIC VALVE AV Area (Vmax):    1.39 cm AV Area (Vmean):   1.38 cm AV Area (VTI):     1.44 cm AV Vmax:           278.00 cm/s AV Vmean:          190.667 cm/s AV VTI:            0.596 m AV Peak Grad:      30.9 mmHg AV Mean Grad:      17.0 mmHg LVOT Vmax:         78.70 cm/s LVOT Vmean:        53.700 cm/s LVOT VTI:          0.175 m LVOT/AV VTI ratio: 0.29  AORTA Ao Root diam: 3.40 cm Ao Asc diam:  3.50 cm MITRAL VALVE               TRICUSPID VALVE                            TR Peak grad:   26.6 mmHg MV Decel Time: 380 msec    TR Vmax:        258.00 cm/s MV E velocity: 42.60 cm/s MV A velocity: 77.90 cm/s  SHUNTS MV E/A ratio:  0.55        Systemic VTI:  0.18 m                            Systemic Diam: 2.50 cm Gwyndolyn Kaufman MD Electronically signed by Gwyndolyn Kaufman MD Signature Date/Time: 10/03/2021/2:55:53 PM    Final      Assessment/Plan  1. Frequent urination - POCT urinalysis dipstick showed trace blood, trace protein and trace leukocyte, not indicative of UTI - Urine Culture  2. Type 2 diabetes mellitus with stage 3 chronic kidney disease, without long-term current use of insulin, unspecified whether stage 3a or 3b CKD (HCC) Lab Results  Component Value Date   HGBA1C 6.1 (H) 10/17/2021   -  improved -  continue Metformin  -  CBG checks and log  3. Essential hypertension, benign -   BP 160/90, SBPs at home obviously in the 130s -   Continue HCTZ, losartan and carvedilol  4. Iron deficiency anemia, unspecified iron deficiency anemia type Lab Results  Component Value Date   HGB 13.8 10/17/2021   -  improved, discontinue FeSO4   Labs/tests ordered:  urine dipstick and culture with CS  Next appt:  in 3 months

## 2021-10-30 LAB — URINE CULTURE
MICRO NUMBER:: 14013678
SPECIMEN QUALITY:: ADEQUATE

## 2021-10-30 NOTE — Progress Notes (Signed)
Culture does not require ATB. No UTI per culture.

## 2021-11-24 ENCOUNTER — Other Ambulatory Visit: Payer: Medicare Other

## 2021-11-24 DIAGNOSIS — Z8546 Personal history of malignant neoplasm of prostate: Secondary | ICD-10-CM

## 2021-11-25 LAB — PSA: Prostate Specific Ag, Serum: 2.3 ng/mL (ref 0.0–4.0)

## 2021-12-01 ENCOUNTER — Encounter: Payer: Self-pay | Admitting: Urology

## 2021-12-01 ENCOUNTER — Ambulatory Visit: Payer: Medicare Other | Admitting: Urology

## 2021-12-01 VITALS — BP 133/71 | HR 73

## 2021-12-01 DIAGNOSIS — R350 Frequency of micturition: Secondary | ICD-10-CM

## 2021-12-01 DIAGNOSIS — Z8546 Personal history of malignant neoplasm of prostate: Secondary | ICD-10-CM

## 2021-12-01 DIAGNOSIS — N3941 Urge incontinence: Secondary | ICD-10-CM | POA: Diagnosis not present

## 2021-12-01 DIAGNOSIS — N3040 Irradiation cystitis without hematuria: Secondary | ICD-10-CM

## 2021-12-01 DIAGNOSIS — R9721 Rising PSA following treatment for malignant neoplasm of prostate: Secondary | ICD-10-CM | POA: Diagnosis not present

## 2021-12-01 DIAGNOSIS — Z87440 Personal history of urinary (tract) infections: Secondary | ICD-10-CM | POA: Diagnosis not present

## 2021-12-01 DIAGNOSIS — R3915 Urgency of urination: Secondary | ICD-10-CM

## 2021-12-01 NOTE — Progress Notes (Signed)
Subjective:  1. Personal history of prostate cancer   2. Rising PSA following treatment for malignant neoplasm of prostate   3. Radiation cystitis   4. Urinary frequency   5. Urgency of urination   6. Urge incontinence      Robert Mcknight has a history of a seed implant for T1c Gleason 6 low risk prostate cancer on 01/05/10. His PSA was 2.2 prior to this visit.  It was 1.9 3/22 but was  2.1 a year ago and 1.7 in 9/20.  His nadir was 0.48 in 12/14.  A PSMA PET on 01/14/20 was negative.  His testosterone was 376 on 01/13/20.  He has also had a prior TURP in 2009. He had hematuria in 4/13 and was found to have radiation changes in the prostate on cystoscopy.  He had further hematuria in 9/20 with repeat cystoscopy on 10/07/18 demonstrating radiation changes again. He has had no further hematuria.  He has no nocturia x 2-3. He still has some urgency and UUI only if he delays voiding a long time. His IPSS is 19.  He tried oxybutynin for a month but it didn't help.  He was given Gemtesa at his last visit and it didn't help.    He has lost about 13 lbs over the last 8 months ago but has no other associated signs or symptoms.     03/24/21: Aneudy returns today in f/u.  He has completed 12 PTNS treatment but has had no improvement in his OAB symptoms.  He continues to have urgency with UUI.   His UA is clear.  His IPSS is 11 with nocturia x 3 and severe urgency.   He has failed Gemtesa and Oxybutynin without improvement.  His PVR is 58m.   He has had no hematuria.  He has some recent diarrhea but no issues with constipation.   He is using depends.     06/02/21: JEdwordreturns today in f/u.  His PSA is stable at 2.2. He has persistent OAB with UUI and his IPSS is 16.  He is wearing 2-3 pads daily.  He has failed multiple meds and PTNS. He has had no hematuria.  His weight is stable.  He has no bone pain.  He has no GI complaints.   He had diverticulitis treated in March.   12/01/21: JJosreturns today in f/u.    His PSA is 2.3 which is minimally increased from 2.2.   He has an IPSS 13 with nocturia x 2-4.   He has some daytime frequency and urgency.  He continues to wear a pad but is not having as much leakage.  He has had no hematuria.  He has no dysuria.  He has had no weight loss and is up about 5lbs.  He has no bone pain.  He has no GI complatins.     His UA is clear today.   IPSS     Row Name 12/01/21 1100         International Prostate Symptom Score   How often have you had the sensation of not emptying your bladder? Less than 1 in 5     How often have you had to urinate less than every two hours? More than half the time     How often have you found you stopped and started again several times when you urinated? Less than 1 in 5 times     How often have you found it difficult to postpone urination? About half  the time     How often have you had a weak urinary stream? Not at All     How often have you had to strain to start urination? Not at All     How many times did you typically get up at night to urinate? 4 Times     Total IPSS Score 13       Quality of Life due to urinary symptoms   If you were to spend the rest of your life with your urinary condition just the way it is now how would you feel about that? Mostly Disatisfied                   ROS:  ROS:  A complete review of systems was performed.  All systems are negative except for pertinent findings as noted.   Review of Systems  Genitourinary:  Positive for frequency and urgency.  All other systems reviewed and are negative.   Allergies  Allergen Reactions   Pollen Extract Other (See Comments)    Runny nose, congestion, cough    Outpatient Encounter Medications as of 12/01/2021  Medication Sig   amLODipine (NORVASC) 10 MG tablet TAKE 1 TABLET EVERY DAY   Apoaequorin (PREVAGEN PO) Take by mouth.   ascorbic acid (VITAMIN C) 500 MG tablet Take 500 mg by mouth daily.   aspirin EC 81 MG EC tablet Take 1 tablet  (81 mg total) by mouth daily.   atorvastatin (LIPITOR) 40 MG tablet TAKE 1 TABLET BY MOUTH EVERY DAY AT 6PM   CALCIUM PO Take by mouth.   carvedilol (COREG) 25 MG tablet Take 25 mg by mouth 2 (two) times daily.   hydrochlorothiazide (HYDRODIURIL) 25 MG tablet Take 1 tablet (25 mg total) by mouth daily. Please call [phone] to schedule for additional refill.   losartan (COZAAR) 25 MG tablet Take 25 mg by mouth daily.   metFORMIN (GLUMETZA) 500 MG (MOD) 24 hr tablet Take 500 mg by mouth 2 (two) times daily with a meal.   Omega-3 Fatty Acids (FISH OIL) 1000 MG CPDR Take 1,200 mg by mouth 2 (two) times daily.   pantoprazole (PROTONIX) 40 MG tablet Take 40 mg by mouth 2 (two) times daily.   Probiotic Product (PROBIOTIC PO) Take by mouth.   vitamin E 1000 UNIT capsule Take by mouth.   Facility-Administered Encounter Medications as of 12/01/2021  Medication   0.9 %  sodium chloride infusion    Past Medical History:  Diagnosis Date   Arthritis    Cataract    Mixed form OU   Colon polyps    Dental infection 10/2016   Diabetes mellitus    GERD (gastroesophageal reflux disease)    Heart murmur    Hyperlipemia    Hypertension    Hypertensive retinopathy    OU   Macular degeneration    Dry OU   Prostate cancer ([place]) 2011   Seasonal allergies    Sleep apnea     Past Surgical History:  Procedure Laterality Date   CARDIAC CATHETERIZATION N/A 02/26/2015   Procedure: Left Heart Cath and Coronary Angiography;  Surgeon: Belva Crome, MD;  Location: [facility];  Service: Cardiovascular;  Laterality: N/A;   ELECTROPHYSIOLOGIC STUDY N/A 05/03/2015   Procedure: SVT Ablation;  Surgeon: Will Meredith Leeds, MD;  Location: [place] [facility];  Service: Cardiovascular;  Laterality: N/A;   FLEXIBLE SIGMOIDOSCOPY N/A 03/23/2013   Procedure: FLEXIBLE SIGMOIDOSCOPY;  Surgeon: Beryle Beams, MD;  Location: [facility];  Service: Endoscopy;  Laterality: N/A;   INGUINAL HERNIA REPAIR  1992    left   MOUTH SURGERY Left 12/22/2016   Top   NASAL SINUS SURGERY  02/01/15   TRANSURETHRAL RESECTION OF PROSTATE     seeds implanted    Social History   Socioeconomic History   Marital status: Married    Spouse name: Not on file   Number of children: Not on file   Years of education: Not on file   Highest education level: Not on file  Occupational History   Not on file  Tobacco Use   Smoking status: Never   Smokeless tobacco: Never  Vaping Use   Vaping Use: Never used  Substance and Sexual Activity   Alcohol use: No   Drug use: No   Sexual activity: Never  Other Topics Concern   Not on file  Social History Narrative   Not on file   Social Determinants of Health   Financial Resource Strain: Not on file  Food Insecurity: Not on file  Transportation Needs: Not on file  Physical Activity: Not on file  Stress: Not on file  Social Connections: Not on file  Intimate Partner Violence: Not on file    Family History  Problem Relation Age of Onset   Colon cancer Brother 45   Colon cancer Brother    Stomach cancer Neg Hx        Objective: Vitals:   12/01/21 1124  BP: 133/71  Pulse: 73     Physical Exam  Lab Results:  No results found for this or any previous visit (from the past 24 hour(s)).  UA is clear.    BMET No results for input(s): "NA", "K", "CL", "CO2", "GLUCOSE", "BUN", "CREATININE", "CALCIUM" in the last 72 hours. PSA PSA  Date Value Ref Range Status  06/24/2019 1.5 < OR = 4.0 ng/mL Final    Comment:    The total PSA value from this assay system is  standardized against the WHO standard. The test  result will be approximately 20% lower when compared  to the equimolar-standardized total PSA (Beckman  Coulter). Comparison of serial PSA results should be  interpreted with this fact in mind. . This test was performed using the Siemens  chemiluminescent method. Values obtained from  different assay methods cannot be used interchangeably. PSA  levels, regardless of value, should not be interpreted as absolute evidence of the presence or absence of disease.   03/01/2015 0.89 0.00 - 4.00 ng/mL Final    Comment:    (NOTE) While PSA levels of <=4.0 ng/ml are reported as reference range, some men with levels below 4.0 ng/ml can have prostate cancer and many men with PSA above 4.0 ng/ml do not have prostate cancer.  Other tests such as free PSA, age specific reference ranges, PSA velocity and PSA doubling time may be helpful especially in men less than 48 years old.    Testosterone  Date Value Ref Range Status  12/25/2019 376 264 - 916 ng/dL Final    Comment:    Adult male reference interval is based on a population of healthy nonobese males (BMI <30) between 78 and 42 years old. [place], [place] [id number]. PMID: [id number].    Lab Results  Component Value Date   PSA1 2.3 11/24/2021   PSA1 2.2 05/26/2021   PSA1 2.2 12/02/2020     UA is clear.  Studies/Results:     Assessment & Plan: History of prostate cancer  with slowly rising PSA s/p seeds that is minimally increased at this visit.  He will f/u in 30mowith a PSA.   Urgency with UUI.  He has moderate LUTS with reduced UUI.  Previously he hadn't responded to oxybutynin, gemtesa or PTNS.  He is not interested in Botox or further medical therapy.   No orders of the defined types were placed in this encounter.     Orders Placed This Encounter  Procedures   Urinalysis, Routine w reflex microscopic   PSA    Standing Status:   Future    Standing Expiration Date:   12/02/2022      Return in about 6 months (around 06/01/2022) for with PSA.   CC: Medina-Vargas, MSenaida Lange NP      JIrine Seal11/09/2021 Patient ID: JGustavus Bryant male   DOB: 1October 20, 1935 88y.o.   MRN: [id number]

## 2021-12-02 LAB — URINALYSIS, ROUTINE W REFLEX MICROSCOPIC
Bilirubin, UA: NEGATIVE
Glucose, UA: NEGATIVE
Leukocytes,UA: NEGATIVE
Nitrite, UA: NEGATIVE
RBC, UA: NEGATIVE
Specific Gravity, UA: 1.015 (ref 1.005–1.030)
Urobilinogen, Ur: 0.2 mg/dL (ref 0.2–1.0)
pH, UA: 6 (ref 5.0–7.5)

## 2021-12-02 LAB — MICROSCOPIC EXAMINATION

## 2021-12-28 ENCOUNTER — Other Ambulatory Visit: Payer: Self-pay | Admitting: Clinical Cardiac Electrophysiology

## 2021-12-28 DIAGNOSIS — R1319 Other dysphagia: Secondary | ICD-10-CM

## 2022-01-27 ENCOUNTER — Ambulatory Visit: Payer: Medicare Other | Admitting: Adult Health

## 2022-03-14 ENCOUNTER — Telehealth: Payer: Self-pay | Admitting: Internal Medicine

## 2022-03-14 NOTE — Telephone Encounter (Signed)
Spoke to PT and gave the information for him to reschedule.

## 2022-03-14 NOTE — Telephone Encounter (Signed)
Please provide them with the radiology scheduling number that we gave you all to give the pts to call to reschedule. [phone].

## 2022-03-14 NOTE — Telephone Encounter (Signed)
PT wife is calling to have his DG ESOPHAGUS W DOUBLE CM rescheduled. Please advise.

## 2022-03-17 ENCOUNTER — Ambulatory Visit (HOSPITAL_COMMUNITY)
Admission: RE | Admit: 2022-03-17 | Discharge: 2022-03-17 | Disposition: A | Discharge status: Home / Self Care | Payer: Medicare Other | Source: Ambulatory Visit | Attending: Internal Medicine | Admitting: Internal Medicine

## 2022-03-17 DIAGNOSIS — R1319 Other dysphagia: Secondary | ICD-10-CM | POA: Insufficient documentation

## 2022-04-13 NOTE — Therapy (Signed)
OUTPATIENT SPEECH LANGUAGE PATHOLOGY SWALLOW EVALUATION   Patient Name: Robert Mcknight MRN: [id number] DOB:[birth year], 89 y.o., male Today's Date: 04/14/2022  PCP: Carmela Hurt NP REFERRING PROVIDER: Donnajean Lopes, MD  END OF SESSION:  End of Session - 04/14/22 1044     Visit Number 1    Number of Visits 5    Date for SLP Re-Evaluation 05/12/22    Authorization Type BCBS    SLP Start Time 1016    SLP Stop Time  1100    SLP Time Calculation (min) 44 min    Activity Tolerance Patient tolerated treatment well             Past Medical History:  Diagnosis Date   Arthritis    Cataract    Mixed form OU   Colon polyps    Dental infection 10/2016   Diabetes mellitus    GERD (gastroesophageal reflux disease)    Heart murmur    Hyperlipemia    Hypertension    Hypertensive retinopathy    OU   Macular degeneration    Dry OU   Prostate cancer ([place]) 2011   Seasonal allergies    Sleep apnea    Past Surgical History:  Procedure Laterality Date   CARDIAC CATHETERIZATION N/A 02/26/2015   Procedure: Left Heart Cath and Coronary Angiography;  Surgeon: Belva Crome, MD;  Location: [facility];  Service: Cardiovascular;  Laterality: N/A;   ELECTROPHYSIOLOGIC STUDY N/A 05/03/2015   Procedure: SVT Ablation;  Surgeon: Will Meredith Leeds, MD;  Location: [facility];  Service: Cardiovascular;  Laterality: N/A;   FLEXIBLE SIGMOIDOSCOPY N/A 03/23/2013   Procedure: FLEXIBLE SIGMOIDOSCOPY;  Surgeon: Beryle Beams, MD;  Location: [facility];  Service: Endoscopy;  Laterality: N/A;   INGUINAL HERNIA REPAIR  1992   left   MOUTH SURGERY Left 12/22/2016   Top   NASAL SINUS SURGERY  02/01/15   TRANSURETHRAL RESECTION OF PROSTATE     seeds implanted   Patient Active Problem List   Diagnosis Date Noted   Iron deficiency anemia 01/05/2017   SVT (supraventricular tachycardia)    Aortic stenosis, mild    Paroxysmal SVT (supraventricular tachycardia) 02/27/2015    Hypokalemia 02/26/2015   Tachyarrhythmia 02/25/2015   Chest pain 02/25/2015   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   Bradycardia 03/24/2013    Class: Acute   Obstructive sleep apnea 03/24/2013    Class: Chronic   Type II diabetes mellitus ([place]) 03/22/2013   Essential hypertension, benign 03/22/2013   Other and unspecified hyperlipidemia 03/22/2013   Diverticular hemorrhage 03/20/2013    ONSET DATE: ~1 year (04/07/22=referral date)  REFERRING DIAG: R13.19 (ICD-10-CM) - Other dysphagia  THERAPY DIAG: Dysphagia, unspecified type  Rationale for Evaluation and Treatment: Rehabilitation  SUBJECTIVE:   SUBJECTIVE STATEMENT: "Every once in awhile, I get strangled" Pt accompanied by: significant other  PERTINENT HISTORY: GERD, diabetes, HTN, sleep apnea   PAIN: Are you having pain? No  FALLS: Has patient fallen in last 6 months?  No  LIVING ENVIRONMENT: Lives with: lives with their family Lives in: House/apartment  PLOF:  Level of assistance: Independent with ADLs, Independent with IADLs Employment: Retired  PATIENT GOALS: improve safety during swallowing   OBJECTIVE:   DIAGNOSTIC FINDINGS:  Esophagram IMPRESSION (03/17/22): Trace laryngeal penetration without aspiration. 12.5 mm diameter barium tablet transiently lodged in the vallecula, recreating patient's symptoms; this was cleared by a second swallow of water and passed to stomach without obstruction.  RECOMMENDATIONS FROM OBJECTIVE SWALLOW STUDY (  MBSS/FEES):  None at this time  Objective swallow impairments: N/A Objective recommended compensations: N/A  COGNITION: Overall cognitive status: Within functional limits for tasks assessed (seemingly age-related memory changes as wife provided some hx)  ORAL MOTOR EXAMINATION: Overall status: WFL  CLINICAL SWALLOW ASSESSMENT:   Current diet: regular and thin liquids Dentition: adequate natural dentition Patient directly observed with POs: Yes: regular, thin liquids, and mixed  consistency  Feeding: able to feed self Liquids provided by: cup Oral phase signs and symptoms: right pocketing Pharyngeal phase signs and symptoms:  None   TODAY'S TREATMENT:                                                                                                                                          04/14/22: Based on clinical observations, provided education and instruction of recommended swallow precautions to maximize pt safety during PO intake. Handout provided (see pt instructions). Pt and wife verbalized understanding of education and recommendations. Declined need for further ST intervention at this time to monitor carryover of strategies but aware of ability to request f/u visit(s) if swallow precautions were not increasing pt safety.   PATIENT EDUCATION: Education details: see above Person educated: Patient and Spouse Education method: Explanation, Demonstration, and Handouts Education comprehension: verbalized understanding, returned demonstration, and needs further education   GOALS: Goals reviewed with patient? Yes  LONG TERM GOALS: Target date: 05/12/2022 (STGs=LTGs)  Pt will verbalize recommended swallow precautions and their rationale given rare min A  Baseline:  Goal status: INITIAL  2.  Pt will carryover recommended swallow precautions given rare min A > 1 week  Baseline:  Goal status: INITIAL  3.  Pt will complete objective swallow study as needed to evaluate pharyngeal function and assess for risk of aspiration Baseline:  Goal status: INITIAL  ASSESSMENT:  CLINICAL IMPRESSION: Patient is a 89 y.o. M who was seen today for dysphagia. PMHX includes GERD (taking prescribed medication). Reported some occasional difficulty swallowing (a few times a month); endorsed more difficulty with liquids versus solids. Clinical swallow assessment revealed fast rate, mild right pocking d/t over-filling oral cavity, and lack of alternating liquids/solids. No overt  s/sx of aspiration exhibited today. Suspect fast rate and enlarged boluses impacting age-related decline in swallow function. Education and instruction of recommended swallow precautions provided (see pt instructions). Recommended pt implement these recommendations and f/u within 1 month as needed if further education and/or objective swallow study warranted. Pt and wife verbalized understanding and will call back to schedule f/u within 1 month if needed.   OBJECTIVE IMPAIRMENTS: include dysphagia. These impairments are limiting patient from safety when swallowing. Factors affecting potential to achieve goals and functional outcome are ability to learn/carryover information. Patient will benefit from skilled SLP services to address above impairments and improve overall function.  REHAB POTENTIAL: Good  PLAN:  SLP FREQUENCY: 1x/week  SLP DURATION: 4 weeks  PLANNED  INTERVENTIONS: Aspiration precaution training, Pharyngeal strengthening exercises, Cueing hierachy, Internal/external aids, and Functional tasks    Marzetta Board, CCC-SLP 04/14/2022, 11:04 AM

## 2022-04-14 ENCOUNTER — Ambulatory Visit: Payer: Medicare Other | Attending: Internal Medicine

## 2022-04-14 DIAGNOSIS — R1310 Dysphagia, unspecified: Secondary | ICD-10-CM | POA: Insufficient documentation

## 2022-04-14 NOTE — Patient Instructions (Signed)
SLP recommendations:  Take your time while you're eating/drinking   Take one sip at a time Chew and swallow each bite before you take another   Turn off distractions (TV, radio) Don't talk with food in your mouth

## 2022-05-25 ENCOUNTER — Other Ambulatory Visit: Payer: Medicare Other

## 2022-05-25 DIAGNOSIS — R9721 Rising PSA following treatment for malignant neoplasm of prostate: Secondary | ICD-10-CM

## 2022-05-25 DIAGNOSIS — Z8546 Personal history of malignant neoplasm of prostate: Secondary | ICD-10-CM

## 2022-05-26 LAB — PSA: Prostate Specific Ag, Serum: 2.3 ng/mL (ref 0.0–4.0)

## 2022-06-01 ENCOUNTER — Encounter: Payer: Self-pay | Admitting: Urology

## 2022-06-01 ENCOUNTER — Ambulatory Visit: Payer: Medicare Other | Admitting: Urology

## 2022-06-01 VITALS — BP 129/69 | HR 69 | Ht 68.5 in | Wt 160.0 lb

## 2022-06-01 DIAGNOSIS — N3941 Urge incontinence: Secondary | ICD-10-CM | POA: Diagnosis not present

## 2022-06-01 DIAGNOSIS — R9721 Rising PSA following treatment for malignant neoplasm of prostate: Secondary | ICD-10-CM

## 2022-06-01 DIAGNOSIS — N3040 Irradiation cystitis without hematuria: Secondary | ICD-10-CM

## 2022-06-01 DIAGNOSIS — Z8546 Personal history of malignant neoplasm of prostate: Secondary | ICD-10-CM

## 2022-06-01 DIAGNOSIS — R350 Frequency of micturition: Secondary | ICD-10-CM

## 2022-06-01 DIAGNOSIS — R3915 Urgency of urination: Secondary | ICD-10-CM

## 2022-06-01 LAB — URINALYSIS, ROUTINE W REFLEX MICROSCOPIC
Bilirubin, UA: NEGATIVE
Glucose, UA: NEGATIVE
Nitrite, UA: NEGATIVE
RBC, UA: NEGATIVE
Specific Gravity, UA: 1.025 (ref 1.005–1.030)
Urobilinogen, Ur: 0.2 mg/dL (ref 0.2–1.0)
pH, UA: 5 (ref 5.0–7.5)

## 2022-06-01 LAB — MICROSCOPIC EXAMINATION
Bacteria, UA: NONE SEEN
RBC, Urine: NONE SEEN /hpf (ref 0–2)

## 2022-06-01 NOTE — Progress Notes (Signed)
Subjective:  1. Personal history of prostate cancer   2. Rising PSA following treatment for malignant neoplasm of prostate   3. Radiation cystitis   4. Urge incontinence   5. Urgency of urination   6. Urinary frequency      [name] has a history of a seed implant for T1c Gleason 6 low risk prostate cancer on 01/05/10. His PSA was 2.2 prior to this visit.  It was 1.9 3/22 but was  2.1 a year ago and 1.7 in 9/20.  His nadir was 0.48 in 12/14.  A PSMA PET on 01/14/20 was negative.  His testosterone was 376 on 01/13/20.  He has also had a prior TURP in 2009. He had hematuria in 4/13 and was found to have radiation changes in the prostate on cystoscopy.  He had further hematuria in 9/20 with repeat cystoscopy on 10/07/18 demonstrating radiation changes again. He has had no further hematuria.  He has no nocturia x 2-3. He still has some urgency and UUI only if he delays voiding a long time. His IPSS is 19.  He tried oxybutynin for a month but it didn't help.  He was given Gemtesa at his last visit and it didn't help.    He has lost about 13 lbs over the last 8 months ago but has no other associated signs or symptoms.     03/24/21: Cruise returns today in f/u.  He has completed 12 PTNS treatment but has had no improvement in his OAB symptoms.  He continues to have urgency with UUI.   His UA is clear.  His IPSS is 11 with nocturia x 3 and severe urgency.   He has failed Gemtesa and Oxybutynin without improvement.  His PVR is 23ml.   He has had no hematuria.  He has some recent diarrhea but no issues with constipation.   He is using depends.     06/02/21: Osiris returns today in f/u.  His PSA is stable at 2.2. He has persistent OAB with UUI and his IPSS is 16.  He is wearing 2-3 pads daily.  He has failed multiple meds and PTNS. He has had no hematuria.  His weight is stable.  He has no bone pain.  He has no GI complaints.   He had diverticulitis treated in March.   12/01/21: [name] returns today in f/u.    His PSA is 2.3 which is minimally increased from 2.2.   He has an IPSS 13 with nocturia x 2-4.   He has some daytime frequency and urgency.  He continues to wear a pad but is not having as much leakage.  He has had no hematuria.  He has no dysuria.  He has had no weight loss and is up about 5lbs.  He has no bone pain.  He has no GI complatins.   06/01/22: [name] returns today in f/u for his history of prostate cancer above.  His PSA is stable at 2.3.  He has variable nocturia but generally goes 2x nightly.  His IPSS is 13 with urgency and some frequency with intermittency.  He wears a pad for UUI.  He has failed PTNS and medical therapy.  His UA is clear.     His UA is clear today.   IPSS     Row Name 06/01/22 1100         International Prostate Symptom Score   How often have you had the sensation of not emptying your bladder? Less than 1  in 5     How often have you had to urinate less than every two hours? Less than half the time     How often have you found you stopped and started again several times when you urinated? About half the time     How often have you found it difficult to postpone urination? Almost always     How often have you had a weak urinary stream? Not at All     How often have you had to strain to start urination? Not at All     How many times did you typically get up at night to urinate? 2 Times     Total IPSS Score 13       Quality of Life due to urinary symptoms   If you were to spend the rest of your life with your urinary condition just the way it is now how would you feel about that? Mixed                   ROS:  ROS:  A complete review of systems was performed.  All systems are negative except for pertinent findings as noted.   Review of Systems  Respiratory:  Positive for cough.   Genitourinary:  Positive for frequency and urgency.  All other systems reviewed and are negative.   Allergies  Allergen Reactions   Pollen Extract Other (See  Comments)    Runny nose, congestion, cough    Outpatient Encounter Medications as of 06/01/2022  Medication Sig   amLODipine (NORVASC) 10 MG tablet TAKE 1 TABLET EVERY DAY   Apoaequorin (PREVAGEN PO) Take by mouth.   ascorbic acid (VITAMIN C) 500 MG tablet Take 500 mg by mouth daily.   aspirin EC 81 MG EC tablet Take 1 tablet (81 mg total) by mouth daily.   atorvastatin (LIPITOR) 40 MG tablet TAKE 1 TABLET BY MOUTH EVERY DAY AT 6PM   CALCIUM PO Take by mouth.   carvedilol (COREG) 25 MG tablet Take 25 mg by mouth 2 (two) times daily.   hydrochlorothiazide (HYDRODIURIL) 25 MG tablet Take 1 tablet (25 mg total) by mouth daily. Please call [phone] to schedule for additional refill.   losartan (COZAAR) 25 MG tablet Take 25 mg by mouth daily.   metFORMIN (GLUMETZA) 500 MG (MOD) 24 hr tablet Take 500 mg by mouth 2 (two) times daily with a meal.   Omega-3 Fatty Acids (FISH OIL) 1000 MG CPDR Take 1,200 mg by mouth 2 (two) times daily.   pantoprazole (PROTONIX) 40 MG tablet Take 40 mg by mouth 2 (two) times daily.   Probiotic Product (PROBIOTIC PO) Take by mouth.   vitamin E 1000 UNIT capsule Take by mouth.   Facility-Administered Encounter Medications as of 06/01/2022  Medication   0.9 %  sodium chloride infusion    Past Medical History:  Diagnosis Date   Arthritis    Cataract    Mixed form OU   Colon polyps    Dental infection 10/2016   Diabetes mellitus    GERD (gastroesophageal reflux disease)    Heart murmur    Hyperlipemia    Hypertension    Hypertensive retinopathy    OU   Macular degeneration    Dry OU   Prostate cancer ([facility]) 2011   Seasonal allergies    Sleep apnea     Past Surgical History:  Procedure Laterality Date   CARDIAC CATHETERIZATION N/A 02/26/2015   Procedure: Left Heart Cath and Coronary  Angiography;  Surgeon: Lyn Records, MD;  Location: [facility];  Service: Cardiovascular;  Laterality: N/A;   ELECTROPHYSIOLOGIC STUDY N/A 05/03/2015   Procedure:  SVT Ablation;  Surgeon: Will Jorja Loa, MD;  Location: [facility];  Service: Cardiovascular;  Laterality: N/A;   FLEXIBLE SIGMOIDOSCOPY N/A 03/23/2013   Procedure: FLEXIBLE SIGMOIDOSCOPY;  Surgeon: Theda Belfast, MD;  Location: [facility];  Service: Endoscopy;  Laterality: N/A;   INGUINAL HERNIA REPAIR  1992   left   MOUTH SURGERY Left 12/22/2016   Top   NASAL SINUS SURGERY  02/01/15   TRANSURETHRAL RESECTION OF PROSTATE     seeds implanted    Social History   Socioeconomic History   Marital status: Married    Spouse name: Not on file   Number of children: Not on file   Years of education: Not on file   Highest education level: Not on file  Occupational History   Not on file  Tobacco Use   Smoking status: Never   Smokeless tobacco: Never  Vaping Use   Vaping Use: Never used  Substance and Sexual Activity   Alcohol use: No   Drug use: No   Sexual activity: Never  Other Topics Concern   Not on file  Social History Narrative   Not on file   Social Determinants of Health   Financial Resource Strain: Not on file  Food Insecurity: Not on file  Transportation Needs: Not on file  Physical Activity: Not on file  Stress: Not on file  Social Connections: Not on file  Intimate Partner Violence: Not on file    Family History  Problem Relation Age of Onset   Colon cancer Brother 83   Colon cancer Brother    Stomach cancer Neg Hx        Objective: Vitals:   06/01/22 1121  BP: 129/69  Pulse: 69     Physical Exam  Lab Results:  Results for orders placed or performed in visit on 06/01/22 (from the past 24 hour(s))  Urinalysis, Routine w reflex microscopic     Status: Abnormal   Collection Time: 06/01/22 11:33 AM  Result Value Ref Range   Specific Gravity, UA 1.025 1.005 - 1.030   pH, UA 5.0 5.0 - 7.5   Color, UA Yellow Yellow   Appearance Ur Clear Clear   Leukocytes,UA Trace (A) Negative   Protein,UA Trace Negative/Trace   Glucose, UA Negative  Negative   Ketones, UA 1+ (A) Negative   RBC, UA Negative Negative   Bilirubin, UA Negative Negative   Urobilinogen, Ur 0.2 0.2 - 1.0 mg/dL   Nitrite, UA Negative Negative   Microscopic Examination See below:    Narrative   Performed at:  [address] - [facility] [address] [place], [place]  [id number] Lab Director: Chinita Pester MT, Phone:  [phone]  Microscopic Examination     Status: Abnormal   Collection Time: 06/01/22 11:33 AM   Urine  Result Value Ref Range   WBC, UA 0-5 0 - 5 /hpf   RBC, Urine None seen 0 - 2 /hpf   Epithelial Cells (non renal) 0-10 0 - 10 /hpf   Casts Present (A) None seen /lpf   Cast Type Hyaline casts N/A   Bacteria, UA None seen None seen/Few   Narrative   Performed at:  [address] - [facility] [address], [place], [place]  [id number] Lab Director: Chinita Pester MT, Phone:  [phone]    UA  is clear.    BMET No results for input(s): "NA", "K", "CL", "CO2", "GLUCOSE", "BUN", "CREATININE", "CALCIUM" in the last 72 hours. PSA PSA  Date Value Ref Range Status  06/24/2019 1.5 < OR = 4.0 ng/mL Final    Comment:    The total PSA value from this assay system is  standardized against the WHO standard. The test  result will be approximately 20% lower when compared  to the equimolar-standardized total PSA (Beckman  Coulter). Comparison of serial PSA results should be  interpreted with this fact in mind. . This test was performed using the Siemens  chemiluminescent method. Values obtained from  different assay methods cannot be used interchangeably. PSA levels, regardless of value, should not be interpreted as absolute evidence of the presence or absence of disease.   03/01/2015 0.89 0.00 - 4.00 ng/mL Final    Comment:    (NOTE) While PSA levels of <=4.0 ng/ml are reported as reference range, some men with levels below 4.0 ng/ml can have prostate cancer and many men with PSA above 4.0 ng/ml do not have prostate cancer.  Other  tests such as free PSA, age specific reference ranges, PSA velocity and PSA doubling time may be helpful especially in men less than 24 years old.    Testosterone  Date Value Ref Range Status  12/25/2019 376 264 - 916 ng/dL Final    Comment:    Adult male reference interval is based on a population of healthy nonobese males (BMI <30) between 68 and 69 years old. Travison, et.al. JCEM [id number]. PMID: [id number].    Lab Results  Component Value Date   PSA1 2.3 05/25/2022   PSA1 2.3 11/24/2021   PSA1 2.2 05/26/2021     UA is clear.  Studies/Results:     Assessment & Plan: History of prostate cancer with slowly rising PSA s/p seeds that has been fairly stable over the last couple of years.  F/u in 6 months with a PSA.  Urgency with UUI.  He has moderate LUTS with reduced UUI.  Previously he hadn't responded to oxybutynin, gemtesa or PTNS.  He is not interested in Botox or further medical therapy.   No orders of the defined types were placed in this encounter.     Orders Placed This Encounter  Procedures   Microscopic Examination   Urinalysis, Routine w reflex microscopic   PSA    Standing Status:   Future    Standing Expiration Date:   06/01/2023      Return in about 6 months (around 12/02/2022) for with PSA.   CC: Medina-Vargas, Margit Banda, NP      Bjorn Pippin 06/02/2022 Patient ID: Andrey Spearman, male   DOB: 22-Apr-1933, 89 y.o.   MRN: [id number]

## 2022-06-27 IMAGING — CT CT ABD-PELV W/ CM
1 of 3 series · 12 of 32 positions shown, 18 images · IV contrast (iopamidol)
Comparison: 10/01/2018

CLINICAL DATA: 87-year-old male with unintended weight loss and a
history prostate cancer. Recent negative PET 01/14/2020

EXAM:
CT ABDOMEN AND PELVIS WITH CONTRAST
TECHNIQUE: Multidetector CT imaging of the abdomen and pelvis was performed
using the standard protocol following bolus administration of
intravenous contrast.
CONTRAST:  100mL [id number] IOPAMIDOL ([id number]) INJECTION 61%

[Series 2: abd/pelvis w/cm · axial · 0.79mm/px · z∈[-433,-23]mm · 12 of 96 slices shown, 18 images]
[im 7/96  soft-tissue]
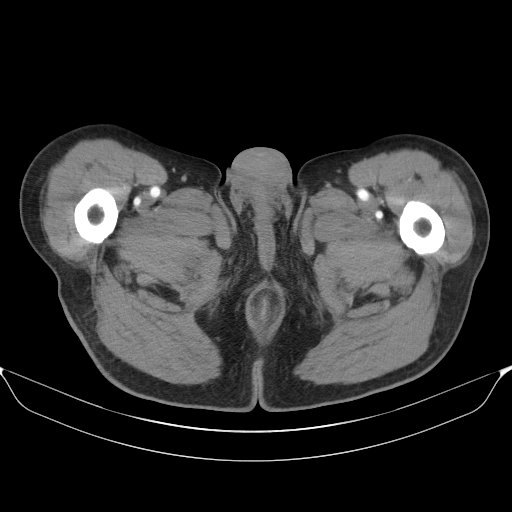
[im 7/96  bone]
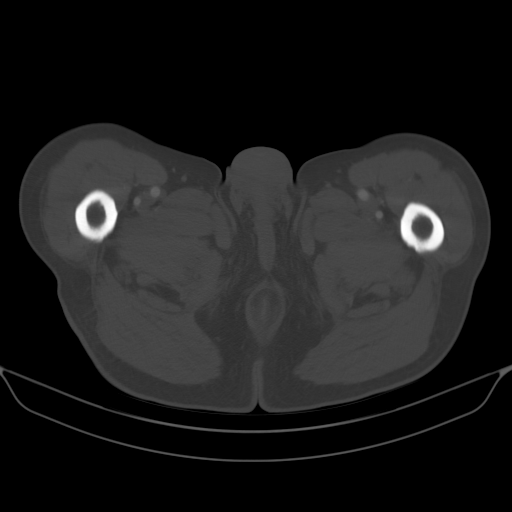
[im 14/96  soft-tissue]
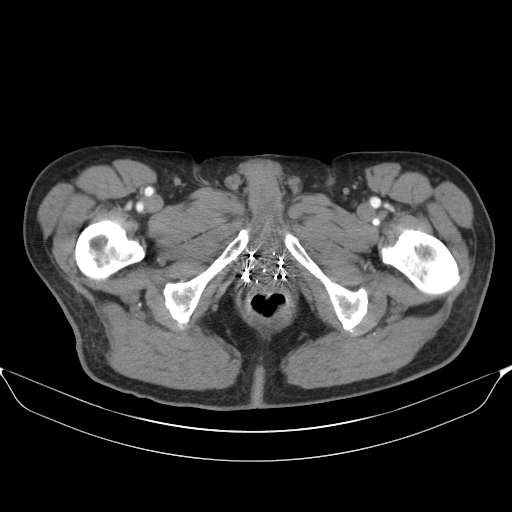
[im 21/96  soft-tissue]
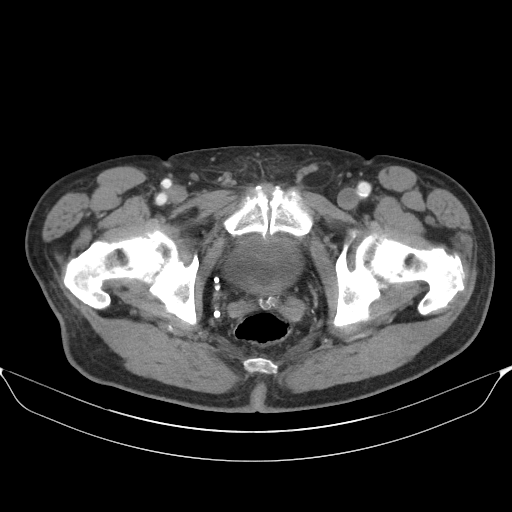
[im 28/96  soft-tissue]
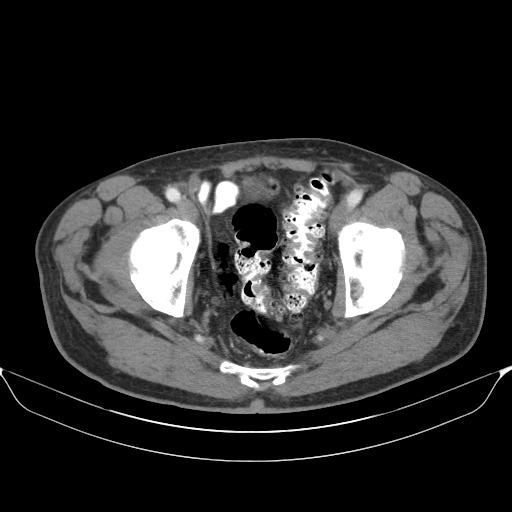
[im 34/96  soft-tissue]
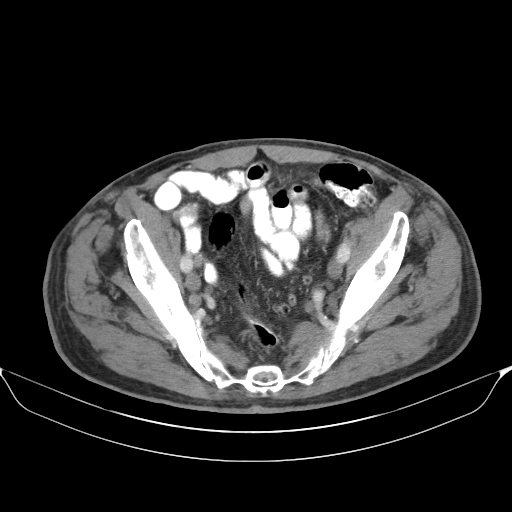
[im 41/96  soft-tissue]
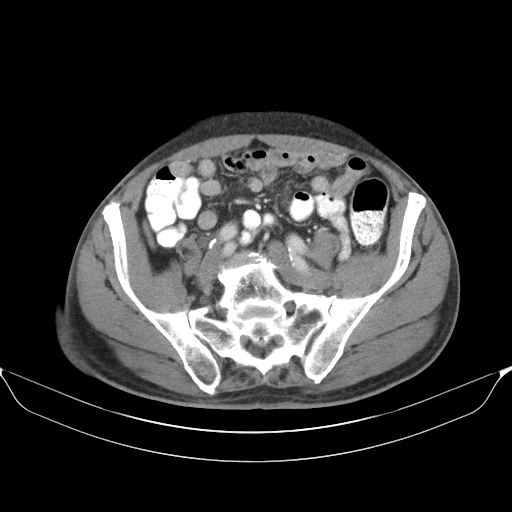
[im 55/96  soft-tissue]
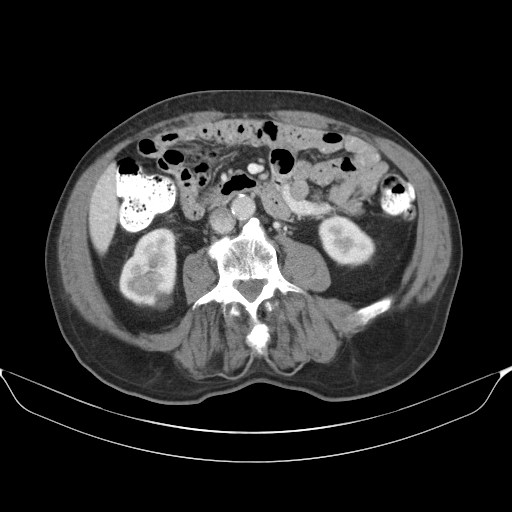
[im 62/96  soft-tissue]
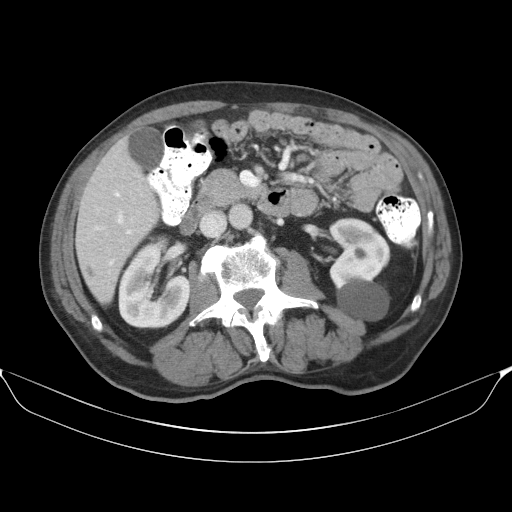
[im 68/96  soft-tissue]
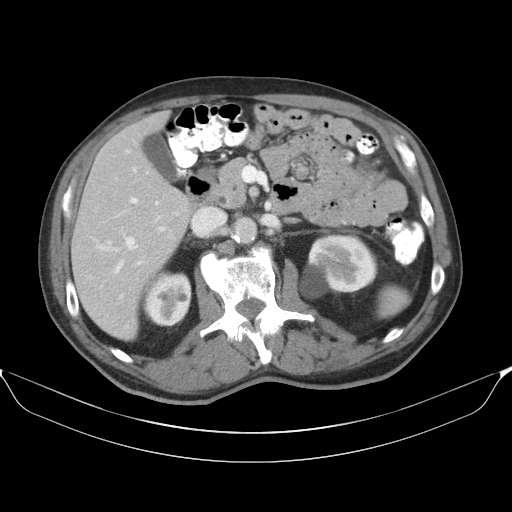
[im 68/96  lung]
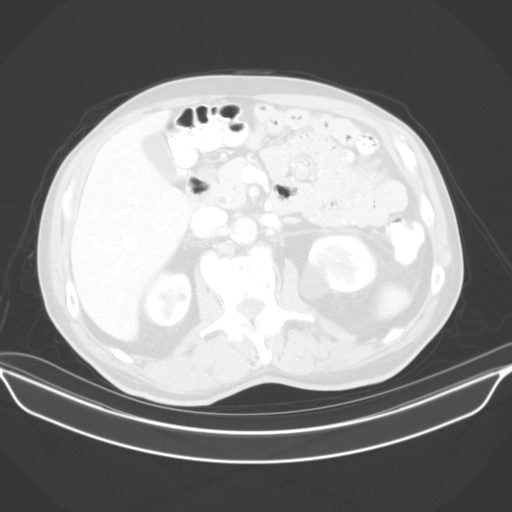
[im 68/96  bone]
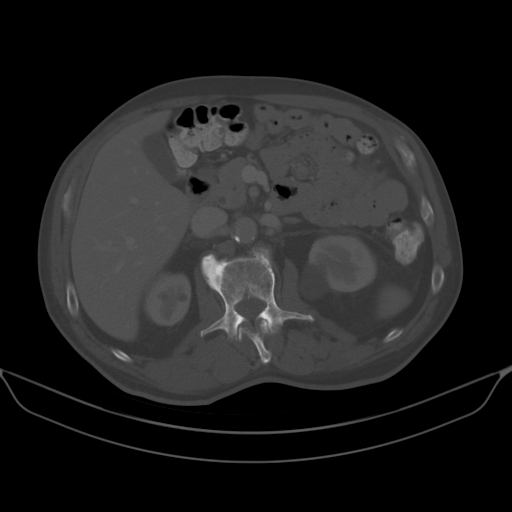
[im 75/96  soft-tissue]
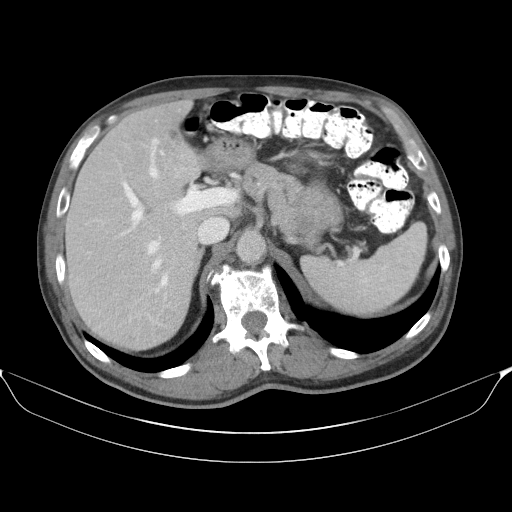
[im 75/96  lung]
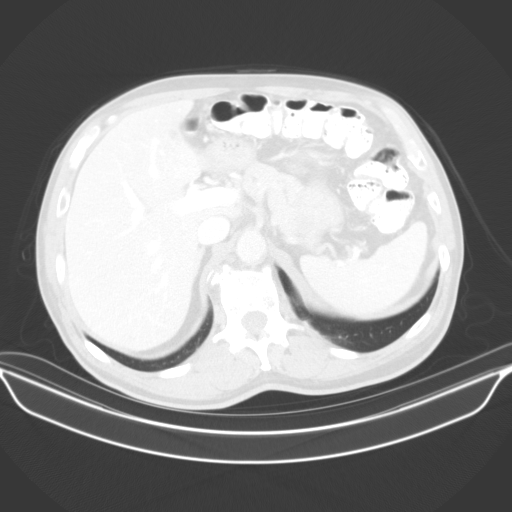
[im 82/96  soft-tissue]
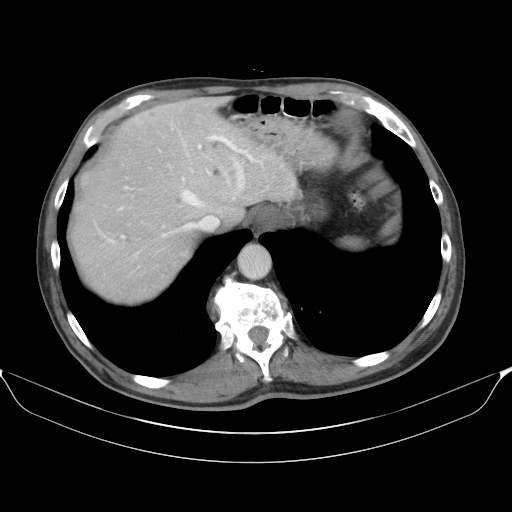
[im 82/96  lung]
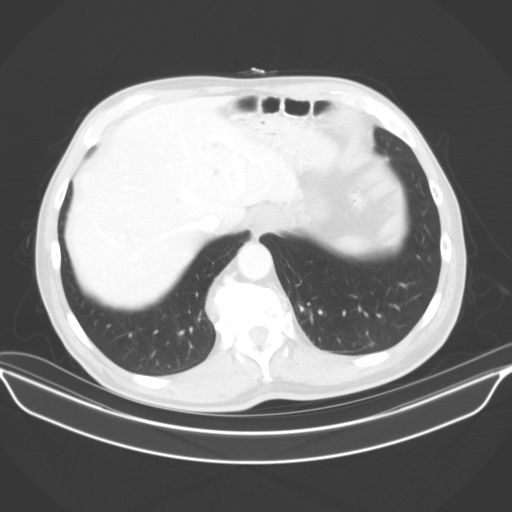
[im 89/96  soft-tissue]
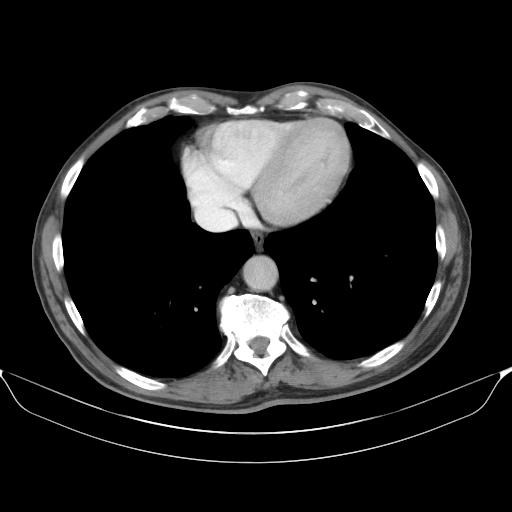
[im 89/96  lung]
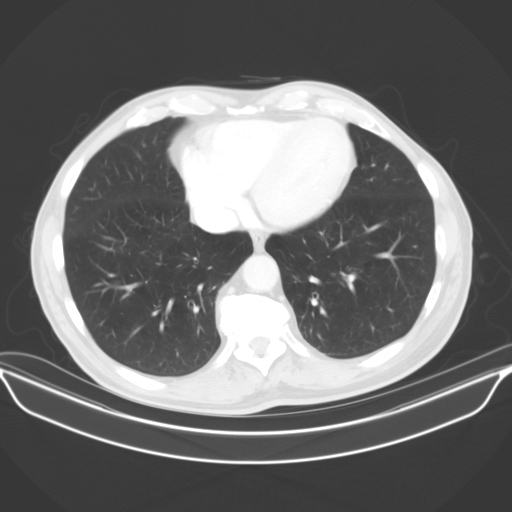

[12 of 32 positions shown; findings below may reference images not displayed]

FINDINGS: Lower chest: No acute finding of the lower chest.

Hepatobiliary: There are several subcentimeter round hypodense
lesions throughout the liver, unchanged from the comparison CT and
most compatible with benign biliary cysts.

Unremarkable gallbladder.

Pancreas: Unremarkable

Spleen: Unremarkable

Adrenals/Urinary Tract:

- Right adrenal gland:  Unremarkable

- Left adrenal gland: Unremarkable.

- Right kidney: No hydronephrosis, nephrolithiasis, inflammation, or
ureteral dilation. Several well-defined low-density and nonenhancing
cystic lesions of the right kidney of varying sizes, most compatible
with simple cysts. Largest on the lower pole measures 23 mm.

- Left Kidney: No hydronephrosis, nephrolithiasis, inflammation, or
ureteral dilation. Several well-defined low-density and nonenhancing
cystic lesions of the left kidney, similar to the comparison. The
largest on the lower pole measures 41 mm.

- Urinary Bladder: Unremarkable.

Stomach/Bowel:

- Stomach: Hiatal hernia.  Otherwise unremarkable stomach.

- Small bowel: Unremarkable

- Appendix: Normal.

- Colon: Moderate formed stool burden with some retained enteric
contrast within the colon.

Advanced diverticular change of the descending colon and sigmoid
colon. No focal wall thickening or focal inflammatory changes.

Vascular/Lymphatic: Mild atherosclerosis. Mesenteric arteries and
renal arteries are patent. No aneurysm of the aorta or inflammatory
changes. Bilateral iliac arteries patent. Proximal femoral arteries
patent.

No lymphadenopathy.

Reproductive: Changes of prior prostate cancer treatment.

Other: None

Musculoskeletal: No acute displaced fracture. Degenerative changes
of the spine. Deformity of the superior endplate of T12 is unchanged
from the comparison.

No bony canal narrowing.
IMPRESSION: No acute CT finding, with no overt CT finding that would account for
the symptom of weight loss.

Chronic diverticular disease of sigmoid colon.

Aortic Atherosclerosis ([id number].L).

Additional ancillary findings as above.

## 2022-12-07 ENCOUNTER — Other Ambulatory Visit: Payer: Medicare Other

## 2022-12-07 DIAGNOSIS — Z8546 Personal history of malignant neoplasm of prostate: Secondary | ICD-10-CM

## 2022-12-08 LAB — PSA: Prostate Specific Ag, Serum: 3.6 ng/mL (ref 0.0–4.0)

## 2022-12-14 ENCOUNTER — Ambulatory Visit: Payer: Medicare Other | Admitting: Urology

## 2022-12-14 ENCOUNTER — Encounter: Payer: Self-pay | Admitting: Urology

## 2022-12-14 VITALS — BP 166/82 | HR 64 | Wt 160.0 lb

## 2022-12-14 DIAGNOSIS — Z8546 Personal history of malignant neoplasm of prostate: Secondary | ICD-10-CM | POA: Diagnosis not present

## 2022-12-14 DIAGNOSIS — R9721 Rising PSA following treatment for malignant neoplasm of prostate: Secondary | ICD-10-CM

## 2022-12-14 DIAGNOSIS — R3915 Urgency of urination: Secondary | ICD-10-CM

## 2022-12-14 DIAGNOSIS — N3941 Urge incontinence: Secondary | ICD-10-CM

## 2022-12-14 DIAGNOSIS — R350 Frequency of micturition: Secondary | ICD-10-CM

## 2022-12-14 DIAGNOSIS — N3040 Irradiation cystitis without hematuria: Secondary | ICD-10-CM

## 2022-12-14 LAB — URINALYSIS, ROUTINE W REFLEX MICROSCOPIC
Bilirubin, UA: NEGATIVE
Glucose, UA: NEGATIVE
Ketones, UA: NEGATIVE
Leukocytes,UA: NEGATIVE
Nitrite, UA: NEGATIVE
Protein,UA: NEGATIVE
RBC, UA: NEGATIVE
Specific Gravity, UA: 1.015 (ref 1.005–1.030)
Urobilinogen, Ur: 0.2 mg/dL (ref 0.2–1.0)
pH, UA: 6.5 (ref 5.0–7.5)

## 2022-12-14 NOTE — Progress Notes (Signed)
Subjective:  1. Personal history of prostate cancer   2. Rising PSA following treatment for malignant neoplasm of prostate   3. Radiation cystitis   4. Urge incontinence   5. Urgency of urination   6. Urinary frequency      Robert Mcknight has a history of a seed implant for T1c Gleason 6 low risk prostate cancer on 01/05/10. His PSA was 2.2 prior to this visit.  It was 1.9 3/22 but was  2.1 a year ago and 1.7 in 9/20.  His nadir was 0.48 in 12/14.  A PSMA PET on 01/14/20 was negative.  His testosterone was 376 on 01/13/20.  Robert Mcknight has also had a prior TURP in 2009. Robert Mcknight had hematuria in 4/13 and was found to have radiation changes in the prostate on cystoscopy.  Robert Mcknight had further hematuria in 9/20 with repeat cystoscopy on 10/07/18 demonstrating radiation changes again. Robert Mcknight has had no further hematuria.  Robert Mcknight has no nocturia x 2-3. Robert Mcknight still has some urgency and UUI only if Robert Mcknight delays voiding a long time. His IPSS is 19.  Robert Mcknight tried oxybutynin for a month but it didn't help.  Robert Mcknight was given Gemtesa at his last visit and it didn't help.    Robert Mcknight has lost about 13 lbs over the last 8 months ago but has no other associated signs or symptoms.     03/24/21: Robert Mcknight returns today in f/u.  Robert Mcknight has completed 12 PTNS treatment but has had no improvement in his OAB symptoms.  Robert Mcknight continues to have urgency with UUI.   His UA is clear.  His IPSS is 11 with nocturia x 3 and severe urgency.   Robert Mcknight has failed Gemtesa and Oxybutynin without improvement.  His PVR is 23ml.   Robert Mcknight has had no hematuria.  Robert Mcknight has some recent diarrhea but no issues with constipation.   Robert Mcknight is using depends.     06/02/21: Robert Mcknight returns today in f/u.  His PSA is stable at 2.2. Robert Mcknight has persistent OAB with UUI and his IPSS is 16.  Robert Mcknight is wearing 2-3 pads daily.  Robert Mcknight has failed multiple meds and PTNS. Robert Mcknight has had no hematuria.  His weight is stable.  Robert Mcknight has no bone pain.  Robert Mcknight has no GI complaints.   Robert Mcknight had diverticulitis treated in March.   12/01/21: Robert Mcknight returns today in f/u.    His PSA is 2.3 which is minimally increased from 2.2.   Robert Mcknight has an IPSS 13 with nocturia x 2-4.   Robert Mcknight has some daytime frequency and urgency.  Robert Mcknight continues to wear a pad but is not having as much leakage.  Robert Mcknight has had no hematuria.  Robert Mcknight has no dysuria.  Robert Mcknight has had no weight loss and is up about 5lbs.  Robert Mcknight has no bone pain.  Robert Mcknight has no GI complatins.   06/01/22: Robert Mcknight returns today in f/u for his history of prostate cancer above.  His PSA is stable at 2.3.  Robert Mcknight has variable nocturia but generally goes 2x nightly.  His IPSS is 13 with urgency and some frequency with intermittency.  Robert Mcknight wears a pad for UUI.  Robert Mcknight has failed PTNS and medical therapy.  His UA is clear.   12/14/22: Robert Mcknight returns today in f/u for his history of prostate cancer with a rising PSA following a seed implant.  His PSA is up to 3.6  12/07/22 from 2.3 on 05/25/22.   Robert Mcknight is getting over a viral URI.  Robert Mcknight has progressive LUTS  with an IPSS of 15 and increased nocturia 5+ times with urgency and UUI.  Robert Mcknight has failed medical therapy and neuromodulation.  Robert Mcknight has had to stay more hydrated on recent meds.      His UA is clear today.   IPSS     Row Name 12/14/22 0900         International Prostate Symptom Score   How often have you had the sensation of not emptying your bladder? Not at All     How often have you had to urinate less than every two hours? More than half the time     How often have you found you stopped and started again several times when you urinated? Less than 1 in 5 times     How often have you found it difficult to postpone urination? More than half the time     How often have you had a weak urinary stream? Less than 1 in 5 times     How often have you had to strain to start urination? Not at All     How many times did you typically get up at night to urinate? 5 Times     Total IPSS Score 15       Quality of Life due to urinary symptoms   If you were to spend the rest of your life with your urinary condition just the way it is now  how would you feel about that? Mostly Disatisfied                    ROS:  ROS:  A complete review of systems was performed.  All systems are negative except for pertinent findings as noted.   Review of Systems  Respiratory:  Positive for cough.   Genitourinary:  Positive for frequency and urgency.  All other systems reviewed and are negative.   Allergies  Allergen Reactions   Pollen Extract Other (See Comments)    Runny nose, congestion, cough    Outpatient Encounter Medications as of 12/14/2022  Medication Sig   amLODipine (NORVASC) 10 MG tablet TAKE 1 TABLET EVERY DAY   Apoaequorin (PREVAGEN PO) Take by mouth.   ascorbic acid (VITAMIN C) 500 MG tablet Take 500 mg by mouth daily.   aspirin EC 81 MG EC tablet Take 1 tablet (81 mg total) by mouth daily.   atorvastatin (LIPITOR) 40 MG tablet TAKE 1 TABLET BY MOUTH EVERY DAY AT 6PM   CALCIUM PO Take by mouth.   carvedilol (COREG) 25 MG tablet Take 25 mg by mouth 2 (two) times daily.   hydrochlorothiazide (HYDRODIURIL) 25 MG tablet Take 1 tablet (25 mg total) by mouth daily. Please call [phone] to schedule for additional refill.   losartan (COZAAR) 25 MG tablet Take 25 mg by mouth daily.   metFORMIN (GLUMETZA) 500 MG (MOD) 24 hr tablet Take 500 mg by mouth 2 (two) times daily with a meal.   Omega-3 Fatty Acids (FISH OIL) 1000 MG CPDR Take 1,200 mg by mouth 2 (two) times daily.   pantoprazole (PROTONIX) 40 MG tablet Take 40 mg by mouth 2 (two) times daily.   Probiotic Product (PROBIOTIC PO) Take by mouth.   vitamin E 1000 UNIT capsule Take by mouth.   Facility-Administered Encounter Medications as of 12/14/2022  Medication   0.9 %  sodium chloride infusion    Past Medical History:  Diagnosis Date   Arthritis    Cataract    Mixed form OU  Colon polyps    Dental infection 10/2016   Diabetes mellitus    GERD (gastroesophageal reflux disease)    Heart murmur    Hyperlipemia    Hypertension     Hypertensive retinopathy    OU   Macular degeneration    Dry OU   Prostate cancer (HCC) 2011   Seasonal allergies    Sleep apnea     Past Surgical History:  Procedure Laterality Date   CARDIAC CATHETERIZATION N/A 02/26/2015   Procedure: Left Heart Cath and Coronary Angiography;  Surgeon: Lyn Records, MD;  Location: [facility] INVASIVE CV LAB;  Service: Cardiovascular;  Laterality: N/A;   ELECTROPHYSIOLOGIC STUDY N/A 05/03/2015   Procedure: SVT Ablation;  Surgeon: Will Jorja Loa, MD;  Location: [facility] INVASIVE CV LAB;  Service: Cardiovascular;  Laterality: N/A;   FLEXIBLE SIGMOIDOSCOPY N/A 03/23/2013   Procedure: FLEXIBLE SIGMOIDOSCOPY;  Surgeon: Theda Belfast, MD;  Location: [facility] ENDOSCOPY;  Service: Endoscopy;  Laterality: N/A;   INGUINAL HERNIA REPAIR  1992   left   MOUTH SURGERY Left 12/22/2016   Top   NASAL SINUS SURGERY  02/01/15   TRANSURETHRAL RESECTION OF PROSTATE     seeds implanted    Social History   Socioeconomic History   Marital status: Married    Spouse name: Not on file   Number of children: Not on file   Years of education: Not on file   Highest education level: Not on file  Occupational History   Not on file  Tobacco Use   Smoking status: Never   Smokeless tobacco: Never  Vaping Use   Vaping status: Never Used  Substance and Sexual Activity   Alcohol use: No   Drug use: No   Sexual activity: Never  Other Topics Concern   Not on file  Social History Narrative   Not on file   Social Determinants of Health   Financial Resource Strain: Not on file  Food Insecurity: Not on file  Transportation Needs: Not on file  Physical Activity: Not on file  Stress: Not on file  Social Connections: Not on file  Intimate Partner Violence: Not on file    Family History  Problem Relation Age of Onset   Colon cancer Brother 69   Colon cancer Brother    Stomach cancer Neg Hx        Objective: Vitals:   12/14/22 0941  BP: (!) 166/82  Pulse: 64     Physical  Exam Vitals reviewed.  Constitutional:      Appearance: Normal appearance.  Lymphadenopathy:     Cervical: No cervical adenopathy.     Upper Body:     Right upper body: No supraclavicular or axillary adenopathy.     Left upper body: No supraclavicular or axillary adenopathy.  Neurological:     Mental Status: Robert Mcknight is alert.     Lab Results:  Results for orders placed or performed in visit on 12/14/22 (from the past 24 hour(s))  Urinalysis, Routine w reflex microscopic     Status: None   Collection Time: 12/14/22  9:40 AM  Result Value Ref Range   Specific Gravity, UA 1.015 1.005 - 1.030   pH, UA 6.5 5.0 - 7.5   Color, UA Yellow Yellow   Appearance Ur Clear Clear   Leukocytes,UA Negative Negative   Protein,UA Negative Negative/Trace   Glucose, UA Negative Negative   Ketones, UA Negative Negative   RBC, UA Negative Negative   Bilirubin, UA Negative Negative   Urobilinogen,  Ur 0.2 0.2 - 1.0 mg/dL   Nitrite, UA Negative Negative   Microscopic Examination Comment    Narrative   Performed at:  [address] - [facility] [address] [place], [place]  [id number] Lab Director: Chinita Pester MT, Phone:  [phone]     UA is clear.    BMET No results for input(s): "NA", "K", "CL", "CO2", "GLUCOSE", "BUN", "CREATININE", "CALCIUM" in the last 72 hours. PSA PSA  Date Value Ref Range Status  06/24/2019 1.5 < OR = 4.0 ng/mL Final    Comment:    The total PSA value from this assay system is  standardized against the WHO standard. The test  result will be approximately 20% lower when compared  to the equimolar-standardized total PSA (Beckman  Coulter). Comparison of serial PSA results should be  interpreted with this fact in mind. . This test was performed using the Siemens  chemiluminescent method. Values obtained from  different assay methods cannot be used interchangeably. PSA levels, regardless of value, should not be interpreted as absolute evidence of the presence or  absence of disease.   03/01/2015 0.89 0.00 - 4.00 ng/mL Final    Comment:    (NOTE) While PSA levels of <=4.0 ng/ml are reported as reference range, some men with levels below 4.0 ng/ml can have prostate cancer and many men with PSA above 4.0 ng/ml do not have prostate cancer.  Other tests such as free PSA, age specific reference ranges, PSA velocity and PSA doubling time may be helpful especially in men less than 82 years old.    Testosterone  Date Value Ref Range Status  12/25/2019 376 264 - 916 ng/dL Final    Comment:    Adult male reference interval is based on a population of healthy nonobese males (BMI <30) between 39 and 35 years old. Travison, et.al. JCEM [id number]. PMID: [id number].    Lab Results  Component Value Date   PSA1 3.6 12/07/2022   PSA1 2.3 05/25/2022   PSA1 2.3 11/24/2021     UA is clear.  Studies/Results:     Assessment & Plan: History of prostate cancer with slowly rising PSA s/p seeds but it has jumped up 50% in the last 6 months.  I will repeat a PSA in 3 months to confirm the rise since Robert Mcknight had a recent URI that could have impacted the PSA.  If the PSA is down, Robert Mcknight will return in 6 months from now with a PSA.  If it is up further, I will consider a PET scan.   Urgency with UUI.  Robert Mcknight has moderate LUTS with reduced UUI.  Previously Robert Mcknight hadn't responded to oxybutynin, gemtesa or PTNS.  Robert Mcknight is not interested in Botox or further medical therapy.   No orders of the defined types were placed in this encounter.     Orders Placed This Encounter  Procedures   Urinalysis, Routine w reflex microscopic   PSA    Standing Status:   Future    Standing Expiration Date:   06/13/2023   PSA    Standing Status:   Future    Standing Expiration Date:   12/14/2023      Return in about 6 months (around 06/13/2023) for with PSA.   CC: Medina-Vargas, Monina C, NP  and Dr. Dossie Arbour.     Robert Mcknight 12/15/2022 Robert Mcknight: Robert Mcknight, male   DOB:  1933-08-25, 89 y.o.   MRN: [id number]

## 2023-03-19 ENCOUNTER — Other Ambulatory Visit: Payer: Medicare Other

## 2023-03-19 ENCOUNTER — Other Ambulatory Visit: Payer: Self-pay

## 2023-03-19 DIAGNOSIS — R9721 Rising PSA following treatment for malignant neoplasm of prostate: Secondary | ICD-10-CM

## 2023-03-19 DIAGNOSIS — Z8546 Personal history of malignant neoplasm of prostate: Secondary | ICD-10-CM

## 2023-03-20 LAB — PSA: Prostate Specific Ag, Serum: 4.2 ng/mL — ABNORMAL HIGH (ref 0.0–4.0)

## 2023-03-26 ENCOUNTER — Telehealth: Payer: Self-pay

## 2023-03-26 ENCOUNTER — Other Ambulatory Visit: Payer: Self-pay | Admitting: Urology

## 2023-03-26 DIAGNOSIS — Z8546 Personal history of malignant neoplasm of prostate: Secondary | ICD-10-CM

## 2023-03-26 DIAGNOSIS — R9721 Rising PSA following treatment for malignant neoplasm of prostate: Secondary | ICD-10-CM

## 2023-03-26 NOTE — Telephone Encounter (Signed)
 Let Pt know what his PSA was per MD PSA 4.2 Pt voiced he is concerned with that increase believes he should have a f/u sooner that his scheduled appointment

## 2023-03-26 NOTE — Telephone Encounter (Signed)
 Called and relayed message to Pt per last encounter scheduled lab appointment Pt wanted to know what his PSA was

## 2023-03-26 NOTE — Telephone Encounter (Signed)
-----   Message from Bjorn Pippin sent at 03/23/2023  5:16 PM EST ----- His PSA is up a bit more but the doubling time is over a year.   He just need to f/u as planned in June and will need a PSA prior to that. ----- Message ----- From: Sarajane Jews, CMA Sent: 03/20/2023  10:52 AM EST To: Bjorn Pippin, MD  Please review

## 2023-03-26 NOTE — Telephone Encounter (Signed)
 Per MD wrenn wants do a PET scan then will f/u with Pt about sooner appointment Pt voiced understanding

## 2023-04-12 ENCOUNTER — Ambulatory Visit (HOSPITAL_COMMUNITY)
Admission: RE | Admit: 2023-04-12 | Discharge: 2023-04-12 | Disposition: A | Discharge status: Home / Self Care | Source: Ambulatory Visit | Attending: Urology | Admitting: Urology

## 2023-04-12 ENCOUNTER — Ambulatory Visit: Payer: Medicare Other | Admitting: Adult Health

## 2023-04-12 ENCOUNTER — Encounter: Payer: Self-pay | Admitting: Adult Health

## 2023-04-12 VITALS — BP 118/74 | HR 67 | Temp 98.3°F | Resp 21 | Ht 68.5 in | Wt 174.6 lb

## 2023-04-12 DIAGNOSIS — I471 Supraventricular tachycardia, unspecified: Secondary | ICD-10-CM | POA: Diagnosis not present

## 2023-04-12 DIAGNOSIS — R9721 Rising PSA following treatment for malignant neoplasm of prostate: Secondary | ICD-10-CM | POA: Insufficient documentation

## 2023-04-12 DIAGNOSIS — B351 Tinea unguium: Secondary | ICD-10-CM

## 2023-04-12 DIAGNOSIS — Z8546 Personal history of malignant neoplasm of prostate: Secondary | ICD-10-CM

## 2023-04-12 DIAGNOSIS — I10 Essential (primary) hypertension: Secondary | ICD-10-CM | POA: Diagnosis not present

## 2023-04-12 DIAGNOSIS — R419 Unspecified symptoms and signs involving cognitive functions and awareness: Secondary | ICD-10-CM

## 2023-04-12 DIAGNOSIS — E785 Hyperlipidemia, unspecified: Secondary | ICD-10-CM

## 2023-04-12 DIAGNOSIS — E1122 Type 2 diabetes mellitus with diabetic chronic kidney disease: Secondary | ICD-10-CM

## 2023-04-12 DIAGNOSIS — K219 Gastro-esophageal reflux disease without esophagitis: Secondary | ICD-10-CM

## 2023-04-12 DIAGNOSIS — N183 Chronic kidney disease, stage 3 unspecified: Secondary | ICD-10-CM

## 2023-04-12 MED ORDER — FLOTUFOLASTAT F 18 GALLIUM 296-5846 MBQ/ML IV SOLN
7.2000 | Freq: Once | INTRAVENOUS | Status: AC
  Administered 2023-04-12: 7.2 via INTRAVENOUS
  Filled 2023-04-12: qty 8

## 2023-04-12 NOTE — Progress Notes (Signed)
 [facility] clinic  Provider:  Kenard Gower DNP  Code Status:  Full Code  Goals of Care:     04/14/2022   10:28 AM  Advanced Directives  Does Patient Have a Medical Advance Directive? Yes  Type of Risk manager Complaint  Patient presents with   Acute Visit   Discussed the use of AI scribe software for clinical note transcription with the patient, who gave verbal consent to proceed.  HPI: Patient is a 89 y.o. male seen today for an acute visit for memory issues. He is accompanied by his daughter.  His wife has observed some memory decline, although his daughter does not perceive a significant change. He occasionally takes a moment to recall information but eventually remembers. He scored a 6 on 6CIT Cognitive Impairment Test (6CIT), missing the full address question. No repeated questioning or significant memory lapses according to his daughter.  He has a history of prostate cancer treated with seeding approximately 12 to 15 years ago. His PSA levels have been slowly increasing, prompting an MRI of the prostate to determine if the cancer is returning. He reports increased urination frequency but no other urinary symptoms.  He has a history of hypertension, currently managed with losartan 25 mg daily, amlodipine 10 mg daily, and hydrochlorothiazide 25 mg daily. His blood pressure is well controlled, typically around 140 mmHg systolic.  He was previously on metformin for diabetes but was taken off the medication 6 to 8 months ago due to a low A1c of 6.1. He monitors his blood sugar regularly, which has been stable.  He has a history of atrial fibrillation and is on Coreg to manage his heart rate, which is currently 67 bpm. No palpitations or chest pain.  He takes pantoprazole 40 mg twice daily for acid reflux and is currently on a course of prednisone and Zithromax for a respiratory infection.  He has a thickened toenail on his left big  toe, which he trims himself.     Lab Results  Component Value Date   CHOL 161 04/12/2023   HDL 73 04/12/2023   LDLCALC 68 04/12/2023   TRIG 113 04/12/2023   CHOLHDL 2.2 04/12/2023      Past Medical History:  Diagnosis Date   Arthritis    Cataract    Mixed form OU   Colon polyps    Dental infection 10/2016   Diabetes mellitus    GERD (gastroesophageal reflux disease)    Heart murmur    Hyperlipemia    Hypertension    Hypertensive retinopathy    OU   Macular degeneration    Dry OU   Prostate cancer (HCC) 2011   Seasonal allergies    Sleep apnea     Past Surgical History:  Procedure Laterality Date   CARDIAC CATHETERIZATION N/A 02/26/2015   Procedure: Left Heart Cath and Coronary Angiography;  Surgeon: Lyn Records, MD;  Location: [facility] INVASIVE CV LAB;  Service: Cardiovascular;  Laterality: N/A;   ELECTROPHYSIOLOGIC STUDY N/A 05/03/2015   Procedure: SVT Ablation;  Surgeon: Will Jorja Loa, MD;  Location: [facility] INVASIVE CV LAB;  Service: Cardiovascular;  Laterality: N/A;   FLEXIBLE SIGMOIDOSCOPY N/A 03/23/2013   Procedure: FLEXIBLE SIGMOIDOSCOPY;  Surgeon: Theda Belfast, MD;  Location: [facility] ENDOSCOPY;  Service: Endoscopy;  Laterality: N/A;   INGUINAL HERNIA REPAIR  1992   left   MOUTH SURGERY Left 12/22/2016   Top   NASAL SINUS SURGERY  02/01/15  TRANSURETHRAL RESECTION OF PROSTATE     seeds implanted    Allergies  Allergen Reactions   Pollen Extract Other (See Comments)    Runny nose, congestion, cough    Outpatient Encounter Medications as of 04/12/2023  Medication Sig   amLODipine (NORVASC) 10 MG tablet TAKE 1 TABLET EVERY DAY   Apoaequorin (PREVAGEN PO) Take by mouth.   ascorbic acid (VITAMIN C) 500 MG tablet Take 500 mg by mouth daily.   aspirin EC 81 MG EC tablet Take 1 tablet (81 mg total) by mouth daily.   atorvastatin (LIPITOR) 40 MG tablet TAKE 1 TABLET BY MOUTH EVERY DAY AT 6PM   azithromycin (ZITHROMAX) 250 MG tablet Take 250 mg by mouth daily.    benzonatate (TESSALON) 200 MG capsule Take 200 mg by mouth.   CALCIUM PO Take by mouth.   carvedilol (COREG) 25 MG tablet Take 25 mg by mouth 2 (two) times daily.   diphenhydrAMINE (BENADRYL) 25 mg capsule Take 25 mg by mouth as needed.   hydrochlorothiazide (HYDRODIURIL) 25 MG tablet Take 1 tablet (25 mg total) by mouth daily. Please call [phone] to schedule for additional refill.   losartan (COZAAR) 25 MG tablet Take 25 mg by mouth daily.   Omega-3 Fatty Acids (FISH OIL) 1000 MG CPDR Take 1,200 mg by mouth 2 (two) times daily.   pantoprazole (PROTONIX) 40 MG tablet Take 40 mg by mouth 2 (two) times daily.   predniSONE (DELTASONE) 20 MG tablet Take 20 mg by mouth daily with breakfast.   Probiotic Product (PROBIOTIC PO) Take by mouth.   vitamin E 1000 UNIT capsule Take by mouth.   [DISCONTINUED] metFORMIN (GLUMETZA) 500 MG (MOD) 24 hr tablet Take 500 mg by mouth 2 (two) times daily with a meal. (Patient not taking: Reported on 04/12/2023)   Facility-Administered Encounter Medications as of 04/12/2023  Medication   0.9 %  sodium chloride infusion    Review of Systems:  Review of Systems  Constitutional:  Negative for activity change, appetite change and fever.  HENT:  Negative for sore throat.   Eyes: Negative.   Cardiovascular:  Negative for chest pain and leg swelling.  Gastrointestinal:  Negative for abdominal distention, diarrhea and vomiting.  Genitourinary:  Negative for dysuria, frequency and urgency.  Skin:  Negative for color change.  Neurological:  Negative for dizziness and headaches.  Psychiatric/Behavioral:  Negative for behavioral problems and sleep disturbance. The patient is not nervous/anxious.     Health Maintenance  Topic Date Due   Medicare Annual Wellness (AWV)  Never done   COVID-19 Vaccine (1) Never done   Pneumonia Vaccine 28+ Years old (1 of 2 - PCV) Never done   OPHTHALMOLOGY EXAM  Never done   Zoster Vaccines- Shingrix (1 of 2) Never done    DTaP/Tdap/Td (2 - Tdap) 06/29/2021   Colonoscopy  01/05/2022   INFLUENZA VACCINE  08/24/2022   HEMOGLOBIN A1C  10/13/2023   FOOT EXAM  04/11/2024   HPV VACCINES  Aged Out    Physical Exam: Vitals:   04/12/23 1147  BP: 118/74  Pulse: 67  Resp: (!) 21  Temp: 98.3 F (36.8 C)  SpO2: 95%  Weight: 174 lb 9.6 oz (79.2 kg)  Height: 5' 8.5" (1.74 m)   Body mass index is 26.16 kg/m. Physical Exam Constitutional:      Appearance: Normal appearance.  HENT:     Head: Normocephalic and atraumatic.     Mouth/Throat:     Mouth: Mucous membranes are moist.  Eyes:     Conjunctiva/sclera: Conjunctivae normal.  Cardiovascular:     Rate and Rhythm: Normal rate and regular rhythm.     Pulses: Normal pulses.     Heart sounds: Normal heart sounds.  Pulmonary:     Effort: Pulmonary effort is normal.     Breath sounds: Normal breath sounds.  Abdominal:     General: Bowel sounds are normal.     Palpations: Abdomen is soft.  Musculoskeletal:        General: No swelling. Normal range of motion.     Cervical back: Normal range of motion.  Skin:    General: Skin is warm and dry.  Neurological:     General: No focal deficit present.     Mental Status: He is alert and oriented to person, place, and time.  Psychiatric:        Mood and Affect: Mood normal.        Behavior: Behavior normal.        Thought Content: Thought content normal.        Judgment: Judgment normal.     Labs reviewed: Basic Metabolic Panel: Recent Labs    04/12/23 1405  NA 137  K 3.5  CL 100  CO2 26  GLUCOSE 165*  BUN 19  CREATININE 1.64*  CALCIUM 9.1   Liver Function Tests: Recent Labs    04/12/23 1405  AST 26  ALT 31  BILITOT 1.0  PROT 6.8   No results for input(s): "LIPASE", "AMYLASE" in the last 8760 hours. No results for input(s): "AMMONIA" in the last 8760 hours. CBC: Recent Labs    04/12/23 1405  WBC 12.3*  NEUTROABS 9,926*  HGB 13.9  HCT 41.0  MCV 87.8  PLT 179   Lipid  Panel: Recent Labs    04/12/23 1405  CHOL 161  HDL 73  LDLCALC 68  TRIG 113  CHOLHDL 2.2   Lab Results  Component Value Date   HGBA1C 6.6 (H) 04/12/2023    Procedures since last visit: No results found.  Assessment/Plan  1. Cognitive complaints (Primary) -  Scored 6 on 6CIT, indicating possible cognitive impairment. Mixed reports from family on memory issues. - Monitor for cognitive decline signs. - Encourage cognitive exercises.  2. History of prostate cancer -  PSA increased to 4.2, indicating possible recurrence. PET scan scheduled to assess for active cancer. - Perform PET scan from skull to mid-thigh. - Follow up with urologist based on PET scan results.  3. Paroxysmal SVT (supraventricular tachycardia) (HCC) -  Heart rate managed with Coreg, currently 67 bpm. No palpitations reported. - Continue Coreg to manage heart rate. - Monitor for palpitations or irregular heartbeats. - CBC with Differential/Platelets  4. Essential hypertension, benign -  Well-controlled with current medication regimen. Blood pressure 118/74 mmHg. - Continue losartan 25 mg daily, amlodipine 10 mg daily, hydrochlorothiazide 25 mg daily. - Complete Metabolic Panel with eGFR  5. Type 2 diabetes mellitus with stage 3 chronic kidney disease, without long-term current use of insulin, unspecified whether stage 3a or 3b CKD (HCC) -  Well-controlled with A1c of 6.1%. Off metformin due to good control. - Hemoglobin A1C - Microalbumin/Creatinine Ratio, Urine  6. Gastroesophageal reflux disease without esophagitis -  Asymptomatic on pantoprazole. - Continue pantoprazole 40 mg twice daily.  7. Onychomycosis -  Thickened toenail on left big toe requires professional care. - Refer to a podiatrist for evaluation and management. - Ambulatory referral to Podiatry  8. Hyperlipidemia, unspecified hyperlipidemia type -  continue Atorvastatin 40 mg daily - Lipid panel     Labs/tests ordered:  A1C,  CBC, CMP and urine microalbumin/creatinine ratio   Return in about 6 months (around 10/13/2023).  Kenard Gower, NP

## 2023-04-13 LAB — COMPLETE METABOLIC PANEL WITH GFR
AG Ratio: 1.6 (calc) (ref 1.0–2.5)
ALT: 31 U/L (ref 9–46)
AST: 26 U/L (ref 10–35)
Albumin: 4.2 g/dL (ref 3.6–5.1)
Alkaline phosphatase (APISO): 76 U/L (ref 35–144)
BUN/Creatinine Ratio: 12 (calc) (ref 6–22)
BUN: 19 mg/dL (ref 7–25)
CO2: 26 mmol/L (ref 20–32)
Calcium: 9.1 mg/dL (ref 8.6–10.3)
Chloride: 100 mmol/L (ref 98–110)
Creat: 1.64 mg/dL — ABNORMAL HIGH (ref 0.70–1.22)
Globulin: 2.6 g/dL (ref 1.9–3.7)
Glucose, Bld: 165 mg/dL — ABNORMAL HIGH (ref 65–139)
Potassium: 3.5 mmol/L (ref 3.5–5.3)
Sodium: 137 mmol/L (ref 135–146)
Total Bilirubin: 1 mg/dL (ref 0.2–1.2)
Total Protein: 6.8 g/dL (ref 6.1–8.1)

## 2023-04-13 LAB — LIPID PANEL
Cholesterol: 161 mg/dL (ref <200)
HDL: 73 mg/dL (ref >=40)
LDL Cholesterol (Calc): 68 mg/dL
Non-HDL Cholesterol (Calc): 88 mg/dL (ref <130)
Total CHOL/HDL Ratio: 2.2 (calc) (ref <5.0)
Triglycerides: 113 mg/dL (ref <150)

## 2023-04-13 LAB — CBC WITH DIFFERENTIAL/PLATELET
Absolute Lymphocytes: 1464 {cells}/uL (ref 850–3900)
Absolute Monocytes: 763 {cells}/uL (ref 200–950)
Basophils Absolute: 25 {cells}/uL (ref 0–200)
Basophils Relative: 0.2 %
Eosinophils Absolute: 123 {cells}/uL (ref 15–500)
Eosinophils Relative: 1 %
HCT: 41 % (ref 38.5–50.0)
Hemoglobin: 13.9 g/dL (ref 13.2–17.1)
MCH: 29.8 pg (ref 27.0–33.0)
MCHC: 33.9 g/dL (ref 32.0–36.0)
MCV: 87.8 fL (ref 80.0–100.0)
MPV: 10.4 fL (ref 7.5–12.5)
Monocytes Relative: 6.2 %
Neutro Abs: 9926 {cells}/uL — ABNORMAL HIGH (ref 1500–7800)
Neutrophils Relative %: 80.7 %
Platelets: 179 10*3/uL (ref 140–400)
RBC: 4.67 10*6/uL (ref 4.20–5.80)
RDW: 13.8 % (ref 11.0–15.0)
Total Lymphocyte: 11.9 %
WBC: 12.3 10*3/uL — ABNORMAL HIGH (ref 3.8–10.8)

## 2023-04-13 LAB — HEMOGLOBIN A1C
Hgb A1c MFr Bld: 6.6 %{Hb} — ABNORMAL HIGH (ref <5.7)
Mean Plasma Glucose: 143 mg/dL
eAG (mmol/L): 7.9 mmol/L

## 2023-04-15 NOTE — Progress Notes (Signed)
-    electrolytes, liver enzymes normal -  A1C 6.6, up from 6.1 (a year ago), will need to exercise at least 150 minutes/week and low carb diet -  lipid panel normal -  wbc slightly elevated possibly due to Prednisone use -  no anemia

## 2023-04-19 ENCOUNTER — Telehealth: Payer: Self-pay | Admitting: Urology

## 2023-04-19 NOTE — Telephone Encounter (Signed)
 FYI and advise

## 2023-04-19 NOTE — Telephone Encounter (Signed)
 Wants results for Pet scan

## 2023-04-23 ENCOUNTER — Encounter: Payer: Self-pay | Admitting: Urology

## 2023-04-23 NOTE — Telephone Encounter (Signed)
 FYI in Wrenn's absence please

## 2023-06-07 ENCOUNTER — Other Ambulatory Visit: Payer: Medicare Other

## 2023-06-14 ENCOUNTER — Other Ambulatory Visit

## 2023-06-14 DIAGNOSIS — Z8546 Personal history of malignant neoplasm of prostate: Secondary | ICD-10-CM

## 2023-06-15 ENCOUNTER — Ambulatory Visit: Payer: Self-pay

## 2023-06-15 LAB — PSA: Prostate Specific Ag, Serum: 2.9 ng/mL (ref 0.0–4.0)

## 2023-06-28 ENCOUNTER — Encounter: Payer: Self-pay | Admitting: Urology

## 2023-06-28 ENCOUNTER — Ambulatory Visit: Payer: Medicare Other | Admitting: Urology

## 2023-06-28 VITALS — BP 145/58 | HR 61

## 2023-06-28 DIAGNOSIS — R3915 Urgency of urination: Secondary | ICD-10-CM

## 2023-06-28 DIAGNOSIS — N3941 Urge incontinence: Secondary | ICD-10-CM

## 2023-06-28 DIAGNOSIS — R9721 Rising PSA following treatment for malignant neoplasm of prostate: Secondary | ICD-10-CM

## 2023-06-28 DIAGNOSIS — N3040 Irradiation cystitis without hematuria: Secondary | ICD-10-CM | POA: Diagnosis not present

## 2023-06-28 DIAGNOSIS — C61 Malignant neoplasm of prostate: Secondary | ICD-10-CM

## 2023-06-28 DIAGNOSIS — R350 Frequency of micturition: Secondary | ICD-10-CM

## 2023-06-28 LAB — URINALYSIS, ROUTINE W REFLEX MICROSCOPIC
Bilirubin, UA: NEGATIVE
Leukocytes,UA: NEGATIVE
Nitrite, UA: NEGATIVE
RBC, UA: NEGATIVE
Specific Gravity, UA: 1.03 (ref 1.005–1.030)
Urobilinogen, Ur: 0.2 mg/dL (ref 0.2–1.0)
pH, UA: 5.5 (ref 5.0–7.5)

## 2023-06-28 NOTE — Progress Notes (Unsigned)
 Subjective:  1. Rising PSA following treatment for malignant neoplasm of prostate   2. Prostate cancer (HCC)   3. Radiation cystitis   4. Urge incontinence   5. Urgency of urination   6. Urinary frequency      [name] has a history of a seed implant for T1c Gleason 6 low risk prostate cancer on 01/05/10. His PSA was 2.2 prior to this visit.  It was 1.9 3/22 but was  2.1 a year ago and 1.7 in 9/20.  His nadir was 0.48 in 12/14.  A PSMA PET on 01/14/20 was negative.  His testosterone  was 376 on 01/13/20.  He has also had a prior TURP in 2009. He had hematuria in 4/13 and was found to have radiation changes in the prostate on cystoscopy.  He had further hematuria in 9/20 with repeat cystoscopy on 10/07/18 demonstrating radiation changes again. He has had no further hematuria.  He has no nocturia x 2-3. He still has some urgency and UUI only if he delays voiding a long time. His IPSS is 19.  He tried oxybutynin for a month but it didn't help.  He was given Gemtesa  at his last visit and it didn't help.    He has lost about 13 lbs over the last 8 months ago but has no other associated signs or symptoms.     03/24/21: [name] returns today in f/u.  He has completed 12 PTNS treatment but has had no improvement in his OAB symptoms.  He continues to have urgency with UUI.   His UA is clear.  His IPSS is 11 with nocturia x 3 and severe urgency.   He has failed Gemtesa  and Oxybutynin without improvement.  His PVR is 23ml.   He has had no hematuria.  He has some recent diarrhea but no issues with constipation.   He is using depends.     06/02/21: Robert Mcknight returns today in f/u.  His PSA is stable at 2.2. He has persistent OAB with UUI and his IPSS is 16.  He is wearing 2-3 pads daily.  He has failed multiple meds and PTNS. He has had no hematuria.  His weight is stable.  He has no bone pain.  He has no GI complaints.   He had diverticulitis treated in March.   12/01/21: Robert Mcknight returns today in f/u.   His PSA is 2.3  which is minimally increased from 2.2.   He has an IPSS 13 with nocturia x 2-4.   He has some daytime frequency and urgency.  He continues to wear a pad but is not having as much leakage.  He has had no hematuria.  He has no dysuria.  He has had no weight loss and is up about 5lbs.  He has no bone pain.  He has no GI complatins.   06/01/22: Robert Mcknight returns today in f/u for his history of prostate cancer above.  His PSA is stable at 2.3.  He has variable nocturia but generally goes 2x nightly.  His IPSS is 13 with urgency and some frequency with intermittency.  He wears a pad for UUI.  He has failed PTNS and medical therapy.  His UA is clear.   12/14/22: Robert Mcknight returns today in f/u for his history of prostate cancer with a rising PSA following a seed implant.  His PSA is up to 3.6  12/07/22 from 2.3 on 05/25/22.   He is getting over a viral URI.  He has progressive LUTS with an  IPSS of 15 and increased nocturia 5+ times with urgency and UUI.  He has failed medical therapy and neuromodulation.  He has had to stay more hydrated on recent meds.    06/28/23: Robert Mcknight returns today in f/u for his history of prostate cancer with a rising PSA following a seed implant.  His PSA prior to this visit is actually down to 2.9 after rising to 4.2 in 2/25.  He had a PET scan on 04/20/23 and had mild uptake in the right base of the prostate but no distant mets.  He has moderate LUTS with an IPSS of 26 and nocturia 2-3x.  He has intermittency and some urgency and frequency with a sensation of incomplete emptying but his PVR is 0ml.   His UA has 2+ glucose but is otherwise negative.  HE has no weight loss.  He has had no bone pain.     IPSS     Row Name 06/28/23 1000         International Prostate Symptom Score   How often have you had the sensation of not emptying your bladder? Almost always     How often have you had to urinate less than every two hours? Almost always     How often have you found you stopped and started again  several times when you urinated? Almost always     How often have you found it difficult to postpone urination? Almost always     How often have you had a weak urinary stream? About half the time     How often have you had to strain to start urination? Not at All     How many times did you typically get up at night to urinate? 3 Times     Total IPSS Score 26                     ROS:  ROS:  A complete review of systems was performed.  All systems are negative except for pertinent findings as noted.   Review of Systems  Genitourinary:  Positive for frequency and urgency.  All other systems reviewed and are negative.   Allergies  Allergen Reactions   Pollen Extract Other (See Comments)    Runny nose, congestion, cough    Outpatient Encounter Medications as of 06/28/2023  Medication Sig   amLODipine  (NORVASC ) 10 MG tablet TAKE 1 TABLET EVERY DAY   Apoaequorin (PREVAGEN PO) Take by mouth.   ascorbic acid (VITAMIN C) 500 MG tablet Take 500 mg by mouth daily.   aspirin  EC 81 MG EC tablet Take 1 tablet (81 mg total) by mouth daily.   atorvastatin  (LIPITOR ) 40 MG tablet TAKE 1 TABLET BY MOUTH EVERY DAY AT 6PM   benzonatate (TESSALON) 200 MG capsule Take 200 mg by mouth.   CALCIUM  PO Take by mouth.   carvedilol  (COREG ) 25 MG tablet Take 25 mg by mouth 2 (two) times daily.   diphenhydrAMINE (BENADRYL) 25 mg capsule Take 25 mg by mouth as needed.   hydrochlorothiazide  (HYDRODIURIL ) 25 MG tablet Take 1 tablet (25 mg total) by mouth daily. Please call [phone] to schedule for additional refill.   losartan  (COZAAR ) 25 MG tablet Take 25 mg by mouth daily.   Omega-3 Fatty Acids (FISH OIL) 1000 MG CPDR Take 1,200 mg by mouth 2 (two) times daily.   pantoprazole  (PROTONIX ) 40 MG tablet Take 40 mg by mouth 2 (two) times daily.   predniSONE (DELTASONE) 20 MG tablet  Take 20 mg by mouth daily with breakfast.   Probiotic Product (PROBIOTIC PO) Take by mouth.   vitamin E  1000 UNIT  capsule Take by mouth.   [DISCONTINUED] azithromycin (ZITHROMAX) 250 MG tablet Take 250 mg by mouth daily.   Facility-Administered Encounter Medications as of 06/28/2023  Medication   0.9 %  sodium chloride  infusion    Past Medical History:  Diagnosis Date   Arthritis    Cataract    Mixed form OU   Colon polyps    Dental infection 10/2016   Diabetes mellitus    GERD (gastroesophageal reflux disease)    Heart murmur    Hyperlipemia    Hypertension    Hypertensive retinopathy    OU   Macular degeneration    Dry OU   Prostate cancer (HCC) 2011   Seasonal allergies    Sleep apnea     Past Surgical History:  Procedure Laterality Date   CARDIAC CATHETERIZATION N/A 02/26/2015   Procedure: Left Heart Cath and Coronary Angiography;  Surgeon: Arty Binning, MD;  Location: [facility] INVASIVE CV LAB;  Service: Cardiovascular;  Laterality: N/A;   ELECTROPHYSIOLOGIC STUDY N/A 05/03/2015   Procedure: SVT Ablation;  Surgeon: Will Cortland Ding, MD;  Location: [facility] INVASIVE CV LAB;  Service: Cardiovascular;  Laterality: N/A;   FLEXIBLE SIGMOIDOSCOPY N/A 03/23/2013   Procedure: FLEXIBLE SIGMOIDOSCOPY;  Surgeon: Almeda Aris, MD;  Location: [facility] ENDOSCOPY;  Service: Endoscopy;  Laterality: N/A;   INGUINAL HERNIA REPAIR  1992   left   MOUTH SURGERY Left 12/22/2016   Top   NASAL SINUS SURGERY  02/01/15   TRANSURETHRAL RESECTION OF PROSTATE     seeds implanted    Social History   Socioeconomic History   Marital status: Married    Spouse name: Not on file   Number of children: Not on file   Years of education: Not on file   Highest education level: Not on file  Occupational History   Not on file  Tobacco Use   Smoking status: Never   Smokeless tobacco: Never  Vaping Use   Vaping status: Never Used  Substance and Sexual Activity   Alcohol use: No   Drug use: No   Sexual activity: Never  Other Topics Concern   Not on file  Social History Narrative   Not on file   Social Drivers of Health    Financial Resource Strain: Not on file  Food Insecurity: Not on file  Transportation Needs: Not on file  Physical Activity: Not on file  Stress: Not on file  Social Connections: Not on file  Intimate Partner Violence: Not on file    Family History  Problem Relation Age of Onset   Colon cancer Brother 20   Colon cancer Brother    Stomach cancer Neg Hx        Objective: Vitals:   06/28/23 1006  BP: (!) 145/58  Pulse: 61     Physical Exam Vitals reviewed.  Constitutional:      Appearance: Normal appearance.  Lymphadenopathy:     Cervical: No cervical adenopathy.     Upper Body:     Right upper body: No supraclavicular adenopathy.     Left upper body: No supraclavicular adenopathy.  Neurological:     Mental Status: He is alert.     Lab Results:  Results for orders placed or performed in visit on 06/28/23 (from the past 24 hours)  Urinalysis, Routine w reflex microscopic     Status: Abnormal  Collection Time: 06/28/23 10:06 AM  Result Value Ref Range   Specific Gravity, UA 1.030 1.005 - 1.030   pH, UA 5.5 5.0 - 7.5   Color, UA Yellow Yellow   Appearance Ur Clear Clear   Leukocytes,UA Negative Negative   Protein,UA Trace Negative/Trace   Glucose, UA 2+ (A) Negative   Ketones, UA Trace (A) Negative   RBC, UA Negative Negative   Bilirubin, UA Negative Negative   Urobilinogen, Ur 0.2 0.2 - 1.0 mg/dL   Nitrite, UA Negative Negative   Microscopic Examination Comment    Narrative   Performed at:  [address] - [facility] [address], [place], [place]  [id number] Lab Director: Liliana Regulus MT, Phone:  [phone]      UA is clear with 2+ glucose. Aaron Aas    BMET No results for input(s): "NA", "K", "CL", "CO2", "GLUCOSE", "BUN", "CREATININE", "CALCIUM " in the last 72 hours. PSA PSA  Date Value Ref Range Status  06/24/2019 1.5 < OR = 4.0 ng/mL Final    Comment:    The total PSA value from this assay system is  standardized against the WHO standard.  The test  result will be approximately 20% lower when compared  to the equimolar-standardized total PSA (Beckman  Coulter). Comparison of serial PSA results should be  interpreted with this fact in mind. . This test was performed using the Siemens  chemiluminescent method. Values obtained from  different assay methods cannot be used interchangeably. PSA levels, regardless of value, should not be interpreted as absolute evidence of the presence or absence of disease.   03/01/2015 0.89 0.00 - 4.00 ng/mL Final    Comment:    (NOTE) While PSA levels of <=4.0 ng/ml are reported as reference range, some men with levels below 4.0 ng/ml can have prostate cancer and many men with PSA above 4.0 ng/ml do not have prostate cancer.  Other tests such as free PSA, age specific reference ranges, PSA velocity and PSA doubling time may be helpful especially in men less than 81 years old.    Testosterone   Date Value Ref Range Status  12/25/2019 376 264 - 916 ng/dL Final    Comment:    Adult male reference interval is based on a population of healthy nonobese males (BMI <30) between 46 and 79 years old. Travison, et.al. JCEM [phone]. PMID: [id number].    Lab Results  Component Value Date   PSA1 2.9 06/14/2023   PSA1 4.2 (H) 03/19/2023   PSA1 3.6 12/07/2022   His Cr was 1.64 on 04/12/23.   UA is clear.  Studies/Results: NM PET (PSMA) SKULL TO MID THIGH Result Date: 04/20/2023 CLINICAL DATA:  Prostate cancer. Prior local therapy. PSA elevated at 4.2. EXAM: NUCLEAR MEDICINE PET SKULL BASE TO THIGH TECHNIQUE: 7.2 mCi Flotufolastat (Posluma ) was injected intravenously. Full-ring PET imaging was performed from the skull base to thigh after the radiotracer. CT data was obtained and used for attenuation correction and anatomic localization. COMPARISON:  PSMA PET scan 01/14/2020 FINDINGS: NECK No radiotracer activity in neck lymph nodes. Incidental CT finding: None. CHEST No radiotracer  accumulation within supraclavicular mediastinal lymph nodes. Tiny LEFT upper lobe nodule on image 130 measures 1-2 mm. Is not changed from comparison exam 2021. No new or suspicious pulmonary nodules. Incidental CT finding: Valvular calcification scratch the aortic valvular calcification ABDOMEN/PELVIS Prostate: Multiple brachytherapy seeds within the prostate gland. At the junction of the RIGHT base of the prostate gland and the bladder there is a focus of radiotracer  activity elevated above background with SUV max equal 4.0 on image 287 of the CT data set. Slice 164 the fused data set. Lymph nodes: No abnormal radiotracer accumulation within pelvic or abdominal nodes. Liver: No evidence of liver metastasis. Incidental CT finding: None. SKELETON No focal activity to suggest skeletal metastasis. IMPRESSION: 1. Mild focal radiotracer activity at the junction of the RIGHT base of the prostate gland and the bladder could represent local prostate cancer recurrence. 2. No evidence of metastatic adenopathy in the pelvis or periaortic retroperitoneum. 3. No evidence of visceral metastasis or skeletal metastasis. Electronically Signed   By: Deboraha Fallow M.D.   On: 04/20/2023 10:39       Assessment & Plan: Prostate cancer with slowly rising PSA s/p seeds with some uptake in the right prostate base on the PET but his PSA is back down prior to this visit.   I will get a repeat PSA in 3 months if there is a further rise we could consider SBRT to the prostate.   Urgency with UUI.  He has moderate LUTS with reduced UUI.  Previously he hadn't responded to oxybutynin, gemtesa  or PTNS.  He is not interested in Botox or further medical therapy.   No orders of the defined types were placed in this encounter.     Orders Placed This Encounter  Procedures   Urinalysis, Routine w reflex microscopic   PSA    Standing Status:   Future    Expected Date:   09/28/2023    Expiration Date:   12/28/2023   Ambulatory  referral to Urology    Referral Priority:   Routine    Referral Type:   Consultation    Referral Reason:   Patient Preference    Referred to Provider:   Homero Luster, MD    Requested Specialty:   Urology    Number of Visits Requested:   1      Return in about 3 months (around 09/28/2023) for in [place] with a PSA. .   CC: Medina-Vargas, Monina C, NP  and Dr. Zollie Hipp.     Homero Luster 06/29/2023 Patient ID: Catana Clinch, male   DOB: [birth year], 89 y.o.   MRN: [id number]

## 2023-07-12 ENCOUNTER — Ambulatory Visit: Payer: Medicare Other | Admitting: Urology

## 2023-09-25 ENCOUNTER — Other Ambulatory Visit

## 2023-09-27 ENCOUNTER — Other Ambulatory Visit: Payer: Self-pay

## 2023-09-27 DIAGNOSIS — C61 Malignant neoplasm of prostate: Secondary | ICD-10-CM

## 2023-09-27 DIAGNOSIS — R9721 Rising PSA following treatment for malignant neoplasm of prostate: Secondary | ICD-10-CM

## 2023-09-29 LAB — PSA: Prostate Specific Ag, Serum: 4.1 ng/mL — ABNORMAL HIGH (ref 0.0–4.0)

## 2023-10-01 ENCOUNTER — Ambulatory Visit: Payer: Self-pay

## 2023-10-15 ENCOUNTER — Ambulatory Visit: Admitting: Adult Health
